# Patient Record
Sex: Female | Born: 1945 | Race: Black or African American | Hispanic: No | Smoking: Former smoker
Health system: Southern US, Community
[De-identification: ages and names within clinical notes are randomized; demographics above are authoritative.]

## PROBLEM LIST (undated history)

## (undated) DIAGNOSIS — C801 Malignant (primary) neoplasm, unspecified: Secondary | ICD-10-CM

## (undated) DIAGNOSIS — R918 Other nonspecific abnormal finding of lung field: Secondary | ICD-10-CM

## (undated) DIAGNOSIS — I10 Essential (primary) hypertension: Secondary | ICD-10-CM

## (undated) DIAGNOSIS — E1129 Type 2 diabetes mellitus with other diabetic kidney complication: Secondary | ICD-10-CM

---

## 2013-07-22 ENCOUNTER — Emergency Department: Payer: Self-pay | Admitting: Internal Medicine

## 2018-03-17 ENCOUNTER — Other Ambulatory Visit: Payer: Self-pay | Admitting: Internal Medicine

## 2018-03-17 DIAGNOSIS — R2242 Localized swelling, mass and lump, left lower limb: Secondary | ICD-10-CM

## 2018-03-27 ENCOUNTER — Ambulatory Visit: Admission: RE | Admit: 2018-03-27 | Payer: Medicaid Other | Source: Ambulatory Visit

## 2018-04-06 ENCOUNTER — Ambulatory Visit
Admission: RE | Admit: 2018-04-06 | Discharge: 2018-04-06 | Disposition: A | Discharge status: Home / Self Care | Payer: Medicare (Managed Care) | Source: Ambulatory Visit | Attending: Internal Medicine | Admitting: Internal Medicine

## 2018-04-06 DIAGNOSIS — R2242 Localized swelling, mass and lump, left lower limb: Secondary | ICD-10-CM

## 2018-04-06 DIAGNOSIS — M7122 Synovial cyst of popliteal space [Baker], left knee: Secondary | ICD-10-CM | POA: Diagnosis not present

## 2020-07-31 ENCOUNTER — Observation Stay
Admission: EM | Admit: 2020-07-31 | Discharge: 2020-08-01 | Disposition: A | Discharge status: Home / Self Care | Payer: Medicare (Managed Care) | Attending: Internal Medicine | Admitting: Internal Medicine

## 2020-07-31 ENCOUNTER — Other Ambulatory Visit: Payer: Self-pay

## 2020-07-31 ENCOUNTER — Encounter: Payer: Self-pay | Admitting: Emergency Medicine

## 2020-07-31 ENCOUNTER — Observation Stay: Payer: Medicare (Managed Care)

## 2020-07-31 ENCOUNTER — Emergency Department: Payer: Medicare (Managed Care)

## 2020-07-31 DIAGNOSIS — R9389 Abnormal findings on diagnostic imaging of other specified body structures: Secondary | ICD-10-CM | POA: Diagnosis not present

## 2020-07-31 DIAGNOSIS — R9089 Other abnormal findings on diagnostic imaging of central nervous system: Secondary | ICD-10-CM | POA: Diagnosis not present

## 2020-07-31 DIAGNOSIS — R7989 Other specified abnormal findings of blood chemistry: Secondary | ICD-10-CM | POA: Diagnosis present

## 2020-07-31 DIAGNOSIS — N390 Urinary tract infection, site not specified: Principal | ICD-10-CM | POA: Diagnosis present

## 2020-07-31 DIAGNOSIS — I10 Essential (primary) hypertension: Secondary | ICD-10-CM | POA: Diagnosis not present

## 2020-07-31 DIAGNOSIS — R918 Other nonspecific abnormal finding of lung field: Secondary | ICD-10-CM | POA: Diagnosis not present

## 2020-07-31 DIAGNOSIS — N3000 Acute cystitis without hematuria: Secondary | ICD-10-CM

## 2020-07-31 DIAGNOSIS — Z87891 Personal history of nicotine dependence: Secondary | ICD-10-CM | POA: Diagnosis not present

## 2020-07-31 DIAGNOSIS — R778 Other specified abnormalities of plasma proteins: Secondary | ICD-10-CM | POA: Diagnosis not present

## 2020-07-31 DIAGNOSIS — R4182 Altered mental status, unspecified: Secondary | ICD-10-CM | POA: Diagnosis present

## 2020-07-31 DIAGNOSIS — I447 Left bundle-branch block, unspecified: Secondary | ICD-10-CM | POA: Diagnosis not present

## 2020-07-31 DIAGNOSIS — B9689 Other specified bacterial agents as the cause of diseases classified elsewhere: Secondary | ICD-10-CM | POA: Diagnosis not present

## 2020-07-31 DIAGNOSIS — R2689 Other abnormalities of gait and mobility: Secondary | ICD-10-CM | POA: Diagnosis not present

## 2020-07-31 DIAGNOSIS — G9389 Other specified disorders of brain: Secondary | ICD-10-CM

## 2020-07-31 DIAGNOSIS — E876 Hypokalemia: Secondary | ICD-10-CM | POA: Diagnosis present

## 2020-07-31 DIAGNOSIS — E785 Hyperlipidemia, unspecified: Secondary | ICD-10-CM | POA: Diagnosis present

## 2020-07-31 DIAGNOSIS — G9341 Metabolic encephalopathy: Secondary | ICD-10-CM | POA: Diagnosis present

## 2020-07-31 DIAGNOSIS — E1129 Type 2 diabetes mellitus with other diabetic kidney complication: Secondary | ICD-10-CM | POA: Diagnosis present

## 2020-07-31 DIAGNOSIS — F32A Depression, unspecified: Secondary | ICD-10-CM | POA: Diagnosis present

## 2020-07-31 DIAGNOSIS — Z20822 Contact with and (suspected) exposure to covid-19: Secondary | ICD-10-CM | POA: Insufficient documentation

## 2020-07-31 HISTORY — DX: Essential (primary) hypertension: I10

## 2020-07-31 HISTORY — DX: Other specified disorders of brain: G93.89

## 2020-07-31 HISTORY — DX: Type 2 diabetes mellitus with other diabetic kidney complication: E11.29

## 2020-07-31 LAB — URINALYSIS, COMPLETE (UACMP) WITH MICROSCOPIC
Bilirubin Urine: NEGATIVE
Glucose, UA: NEGATIVE mg/dL
Ketones, ur: NEGATIVE mg/dL
Nitrite: NEGATIVE
Protein, ur: NEGATIVE mg/dL
Specific Gravity, Urine: 1.011 (ref 1.005–1.030)
pH: 8 (ref 5.0–8.0)

## 2020-07-31 LAB — CBC
HCT: 41.8 % (ref 36.0–46.0)
Hemoglobin: 13.2 g/dL (ref 12.0–15.0)
MCH: 27.2 pg (ref 26.0–34.0)
MCHC: 31.6 g/dL (ref 30.0–36.0)
MCV: 86.2 fL (ref 80.0–100.0)
Platelets: 357 10*3/uL (ref 150–400)
RBC: 4.85 MIL/uL (ref 3.87–5.11)
RDW: 15.1 % (ref 11.5–15.5)
WBC: 9 10*3/uL (ref 4.0–10.5)
nRBC: 0 % (ref 0.0–0.2)

## 2020-07-31 LAB — HEPATIC FUNCTION PANEL
ALT: 14 U/L (ref 0–44)
AST: 23 U/L (ref 15–41)
Albumin: 3.7 g/dL (ref 3.5–5.0)
Alkaline Phosphatase: 76 U/L (ref 38–126)
Bilirubin, Direct: 0.1 mg/dL (ref 0.0–0.2)
Indirect Bilirubin: 0.8 mg/dL (ref 0.3–0.9)
Total Bilirubin: 0.9 mg/dL (ref 0.3–1.2)
Total Protein: 7.4 g/dL (ref 6.5–8.1)

## 2020-07-31 LAB — LACTIC ACID, PLASMA
Lactic Acid, Venous: 2.8 mmol/L (ref 0.5–1.9)
Lactic Acid, Venous: 2.1 mmol/L (ref 0.5–1.9)
Lactic Acid, Venous: 2.4 mmol/L (ref 0.5–1.9)
Lactic Acid, Venous: 2.7 mmol/L (ref 0.5–1.9)

## 2020-07-31 LAB — BASIC METABOLIC PANEL
Anion gap: 10 (ref 5–15)
BUN: 13 mg/dL (ref 8–23)
CO2: 27 mmol/L (ref 22–32)
Calcium: 9.3 mg/dL (ref 8.9–10.3)
Chloride: 99 mmol/L (ref 98–111)
Creatinine, Ser: 0.6 mg/dL (ref 0.44–1.00)
GFR, Estimated: >60 mL/min (ref >60)
Glucose, Bld: 175 mg/dL — ABNORMAL HIGH (ref 70–99)
Potassium: 3.1 mmol/L — ABNORMAL LOW (ref 3.5–5.1)
Sodium: 136 mmol/L (ref 135–145)

## 2020-07-31 LAB — PROCALCITONIN: Procalcitonin: 20.33 ng/mL

## 2020-07-31 LAB — TROPONIN I (HIGH SENSITIVITY)
Troponin I (High Sensitivity): 21 ng/L — ABNORMAL HIGH (ref <18)
Troponin I (High Sensitivity): 20 ng/L — ABNORMAL HIGH (ref <18)
Troponin I (High Sensitivity): 19 ng/L — ABNORMAL HIGH (ref <18)

## 2020-07-31 LAB — MAGNESIUM: Magnesium: 1.8 mg/dL (ref 1.7–2.4)

## 2020-07-31 LAB — SARS CORONAVIRUS 2 (TAT 6-24 HRS): SARS Coronavirus 2: NEGATIVE

## 2020-07-31 LAB — CBG MONITORING, ED: Glucose-Capillary: 157 mg/dL — ABNORMAL HIGH (ref 70–99)

## 2020-07-31 LAB — BRAIN NATRIURETIC PEPTIDE: B Natriuretic Peptide: 105 pg/mL — ABNORMAL HIGH (ref 0.0–100.0)

## 2020-07-31 MED ORDER — ATORVASTATIN CALCIUM 20 MG PO TABS
40.0000 mg | ORAL_TABLET | Freq: Every day | ORAL | Status: DC
  Administered 2020-07-31: 40 mg via ORAL
  Filled 2020-07-31: qty 2

## 2020-07-31 MED ORDER — SODIUM CHLORIDE 0.9 % IV SOLN
1.0000 g | INTRAVENOUS | Status: DC
  Administered 2020-07-31: 1 g via INTRAVENOUS
  Filled 2020-07-31 (×2): qty 10

## 2020-07-31 MED ORDER — DULOXETINE HCL 30 MG PO CPEP
60.0000 mg | ORAL_CAPSULE | Freq: Every day | ORAL | Status: DC
  Administered 2020-07-31 – 2020-08-01 (×2): 60 mg via ORAL
  Filled 2020-07-31: qty 2
  Filled 2020-07-31: qty 1

## 2020-07-31 MED ORDER — IOHEXOL 300 MG/ML  SOLN
75.0000 mL | Freq: Once | INTRAMUSCULAR | Status: AC | PRN
  Administered 2020-07-31: 75 mL via INTRAVENOUS

## 2020-07-31 MED ORDER — INSULIN ASPART 100 UNIT/ML ~~LOC~~ SOLN
0.0000 [IU] | Freq: Three times a day (TID) | SUBCUTANEOUS | Status: DC
  Administered 2020-08-01 (×2): 2 [IU] via SUBCUTANEOUS
  Filled 2020-07-31 (×2): qty 1

## 2020-07-31 MED ORDER — LACTATED RINGERS IV BOLUS
1000.0000 mL | Freq: Once | INTRAVENOUS | Status: AC
  Administered 2020-07-31: 1000 mL via INTRAVENOUS

## 2020-07-31 MED ORDER — ACETAMINOPHEN 650 MG RE SUPP: 650.0000 mg | Freq: Four times a day (QID) | RECTAL | Status: DC | PRN

## 2020-07-31 MED ORDER — ONDANSETRON HCL 4 MG/2ML IJ SOLN: 4.0000 mg | Freq: Three times a day (TID) | INTRAMUSCULAR | Status: DC | PRN

## 2020-07-31 MED ORDER — ASPIRIN EC 81 MG PO TBEC: 81.0000 mg | DELAYED_RELEASE_TABLET | Freq: Every day | ORAL | Status: DC

## 2020-07-31 MED ORDER — ACETAMINOPHEN 325 MG PO TABS: 650.0000 mg | ORAL_TABLET | Freq: Four times a day (QID) | ORAL | Status: DC | PRN

## 2020-07-31 MED ORDER — TRAMADOL HCL 50 MG PO TABS
50.0000 mg | ORAL_TABLET | Freq: Four times a day (QID) | ORAL | Status: DC | PRN
  Administered 2020-08-01: 50 mg via ORAL
  Filled 2020-07-31: qty 1

## 2020-07-31 MED ORDER — HYDRALAZINE HCL 20 MG/ML IJ SOLN: 5.0000 mg | INTRAMUSCULAR | Status: DC | PRN

## 2020-07-31 MED ORDER — ENOXAPARIN SODIUM 40 MG/0.4ML ~~LOC~~ SOLN
40.0000 mg | SUBCUTANEOUS | Status: DC
  Administered 2020-07-31: 40 mg via SUBCUTANEOUS
  Filled 2020-07-31: qty 0.4

## 2020-07-31 MED ORDER — INSULIN ASPART 100 UNIT/ML ~~LOC~~ SOLN: 0.0000 [IU] | Freq: Every day | SUBCUTANEOUS | Status: DC

## 2020-07-31 MED ORDER — NIFEDIPINE ER OSMOTIC RELEASE 30 MG PO TB24
90.0000 mg | ORAL_TABLET | Freq: Every day | ORAL | Status: DC
  Administered 2020-07-31 – 2020-08-01 (×2): 90 mg via ORAL
  Filled 2020-07-31 (×2): qty 3

## 2020-07-31 MED ORDER — POTASSIUM CHLORIDE CRYS ER 20 MEQ PO TBCR
40.0000 meq | EXTENDED_RELEASE_TABLET | Freq: Once | ORAL | Status: AC
  Administered 2020-07-31: 40 meq via ORAL
  Filled 2020-07-31: qty 2

## 2020-07-31 MED ORDER — SODIUM CHLORIDE 0.9 % IV SOLN
1.0000 g | Freq: Once | INTRAVENOUS | Status: DC
  Filled 2020-07-31: qty 10

## 2020-07-31 MED ORDER — LACTATED RINGERS IV SOLN: INTRAVENOUS | Status: DC

## 2020-07-31 MED ORDER — LISINOPRIL 20 MG PO TABS
20.0000 mg | ORAL_TABLET | Freq: Every day | ORAL | Status: DC
  Administered 2020-07-31 – 2020-08-01 (×2): 20 mg via ORAL
  Filled 2020-07-31: qty 2
  Filled 2020-07-31: qty 1

## 2020-07-31 MED ORDER — ATORVASTATIN CALCIUM 20 MG PO TABS: 40.0000 mg | ORAL_TABLET | Freq: Every day | ORAL | Status: DC

## 2020-07-31 MED ORDER — METOPROLOL SUCCINATE ER 50 MG PO TB24
50.0000 mg | ORAL_TABLET | Freq: Every day | ORAL | Status: DC
  Administered 2020-07-31 – 2020-08-01 (×2): 50 mg via ORAL
  Filled 2020-07-31 (×2): qty 1

## 2020-07-31 NOTE — ED Notes (Signed)
Medication Reconciliation Report  For Home History Technicians  HIGHLIGHTS:  1. The patient WAS NOT personally interviewed 2. If not, what was the main source used: AVAILABLE CHART NOTES/DOCS 3. Does the patient appear to take any anti-coagulation agents (e.g. warfarin, Eliquis or Xarelto): NO 4. Does the patient appear to take any anti-convulsant agents (e.g. divalproex, levetiracetam or phenytoin): NO 5. Does the patient appear to use any insulin products (e.g. Lantus, Novolin or Humalog): NO 6. Does the patient appear to take any "beta-blockers" (e.g. metoprolol, carvedilol or bisoprolol: YES  BARRIERS:  1. Were there any barriers that prevented or complicated the medication reconciliation process: YES 2. If yes, what was the primary barrier encountered: Poor historian 3. Does the patient appear compliant with prescribed medications: UNABLE TO DETERMINE 4. Does the patient express any barriers with compliance: UNABLE TO DETERMINE 5. What is the primary barrier the patient reports: None   NOTES:[Include any concerns, remarks or complaints the patient expresses regarding medication therapy. Any observations or other information that might be useful to the treatment team can also be included. Immediate needs or concerns should be referred to the RN or appropriate member of the treatment team.]  The patient was not personally interviewed. Home medications updated from medication lists provided by [facility]. Unable to ascertain last doses taken.   Colen Darling, CPhT [place] [facility]. [place], [place] [address] [phone]  ** The above is intended solely for informational and/or communicative purposes. It should in no way be considered an endorsement of any specific treatment, therapy or action. **

## 2020-07-31 NOTE — ED Notes (Signed)
Unable to obtain labs. Lab called at this time.

## 2020-07-31 NOTE — ED Notes (Signed)
Pt given meal tray at this time, offered assistance eating but pt states she is able to eat independently. Pt instructed to use call bell if she needs help. Pt also reconnected to cardiac monitor/bp/spo2 at this time

## 2020-07-31 NOTE — ED Notes (Signed)
Pt seen eating meal tray at this time independently

## 2020-07-31 NOTE — ED Triage Notes (Signed)
Pt comes into the ED via EMS from home with c/o increased weakness and confusion. [facility] physician called and states the pt is being treated for UTI and the culture came back with E. Coli.Marland Kitchen

## 2020-07-31 NOTE — ED Provider Notes (Signed)
[facility] Emergency Department Provider Note   ____________________________________________   Event Date/Time   First MD Initiated Contact with Patient 07/31/20 1058     (approximate)  I have reviewed the triage vital signs and the nursing notes.   HISTORY  Chief Complaint Weakness    HPI Bradyn Vassey is a 75 y.o. female presents to the ED for altered mental status.  History is limited due to patient's confusion.  Per EMS patient has been increasingly weak and confused for about the past 7 days.  Patient states she has been feeling much weaker than usual but weakness does not affect any particular part of her body.  She was unable to get up out of bed this morning, after which EMS was called.  She denies any fevers, cough, chest pain, shortness of breath, vomiting, diarrhea, or dysuria.  Patient reportedly recently diagnosed with UTI and culture had grown E. coli.        History reviewed. No pertinent past medical history.  There are no problems to display for this patient.   History reviewed. No pertinent surgical history.  Prior to Admission medications   Not on File    Allergies Patient has no allergy information on record.  No family history on file.  Social History    Review of Systems  Constitutional: No fever/chills.  Positive for generalized weakness. Eyes: No visual changes. ENT: No sore throat. Cardiovascular: Denies chest pain. Respiratory: Denies shortness of breath. Gastrointestinal: No abdominal pain.  No nausea, no vomiting.  No diarrhea.  No constipation. Genitourinary: Negative for dysuria. Musculoskeletal: Negative for back pain. Skin: Negative for rash. Neurological: Negative for headaches, focal weakness or numbness.  ____________________________________________   PHYSICAL EXAM:  VITAL SIGNS: ED Triage Vitals  Enc Vitals Group     BP 07/31/20 1053 133/85     Pulse Rate 07/31/20 1053 78     Resp 07/31/20  1053 15     Temp 07/31/20 1053 97.9 F (36.6 C)     Temp Source 07/31/20 1053 Oral     SpO2 07/31/20 1053 97 %     Weight 07/31/20 1048 175 lb (79.4 kg)     Height 07/31/20 1048 5\' 5"  (1.651 m)     Head Circumference --      Peak Flow --      Pain Score --      Pain Loc --      Pain Edu? --      Excl. in [place]? --     Constitutional: Alert and oriented to person and place, but not time. Eyes: Conjunctivae are normal.  Pupils equal round and reactive to light bilaterally. Head: Atraumatic. Nose: No congestion/rhinnorhea. Mouth/Throat: Mucous membranes are moist. Neck: Normal ROM Cardiovascular: Normal rate, regular rhythm. Grossly normal heart sounds. Respiratory: Normal respiratory effort.  No retractions. Lungs CTAB. Gastrointestinal: Soft and nontender. No distention. Genitourinary: deferred Musculoskeletal: No lower extremity tenderness nor edema. Neurologic:  Normal speech and language.  Global weakness noted, no gross focal neurologic deficits are appreciated. Skin:  Skin is warm, dry and intact. No rash noted. Psychiatric: Mood and affect are normal. Speech and behavior are normal.  ____________________________________________   LABS (all labs ordered are listed, but only abnormal results are displayed)  Labs Reviewed  BASIC METABOLIC PANEL - Abnormal; Notable for the following components:      Result Value   Potassium 3.1 (*)    Glucose, Bld 175 (*)    All other components within  normal limits  URINALYSIS, COMPLETE (UACMP) WITH MICROSCOPIC - Abnormal; Notable for the following components:   Color, Urine YELLOW (*)    APPearance CLOUDY (*)    Hgb urine dipstick SMALL (*)    Leukocytes,Ua TRACE (*)    Bacteria, UA FEW (*)    All other components within normal limits  LACTIC ACID, PLASMA - Abnormal; Notable for the following components:   Lactic Acid, Venous 2.8 (*)    All other components within normal limits  LACTIC ACID, PLASMA - Abnormal; Notable for the  following components:   Lactic Acid, Venous 2.1 (*)    All other components within normal limits  CULTURE, BLOOD (ROUTINE X 2)  CULTURE, BLOOD (ROUTINE X 2)  URINE CULTURE  SARS CORONAVIRUS 2 (TAT 6-24 HRS)  CBC  HEPATIC FUNCTION PANEL   ____________________________________________  EKG  ED ECG REPORT I, Blake Divine, the attending physician, personally viewed and interpreted this ECG.   Date: 07/31/2020  EKG Time: 11:02  Rate: 73  Rhythm: normal sinus rhythm  Axis: LAD  Intervals:left bundle branch block  ST&T Change: None, negative sgarbossa   PROCEDURES  Procedure(s) performed (including Critical Care):  Procedures   ____________________________________________   INITIAL IMPRESSION / ASSESSMENT AND PLAN / ED COURSE       75 year old female presents to the ED for generalized weakness for the past 7 days and difficulty walking today.  She appears globally weak but has no focal neurologic deficits.  She reportedly was diagnosed with UTI recently and culture grew E. coli.  I attempted to reach family for more information, but was unsuccessful, it is unclear whether patient has already been put on antibiotics.  We will check labs, including blood cultures, but vital signs are reassuring and not consistent with sepsis.  Lactate mildly elevated, we will continue hydration with IV fluids and trend.  Remainder of labs are reassuring, UA does have signs of infection which we will treat with Rocephin and sent for culture.  Prior culture reportedly grew E. coli, although sensitivities are unavailable.  Given patient's altered mental status, case discussed with hospitalist for admission.      ____________________________________________   FINAL CLINICAL IMPRESSION(S) / ED DIAGNOSES  Final diagnoses:  Altered mental status, unspecified altered mental status type  Acute cystitis without hematuria     ED Discharge Orders    None       Note:  This document was  prepared using Dragon voice recognition software and may include unintentional dictation errors.   Blake Divine, MD 07/31/20 1346

## 2020-07-31 NOTE — Consult Note (Signed)
CODE SEPSIS - PHARMACY COMMUNICATION  **Broad Spectrum Antibiotics should be administered within 1 hour of Sepsis diagnosis**  Time Code Sepsis Called/Page Received: 1/3 1354  Antibiotics Ordered: ceftriaxone   Time of 1st antibiotic administration: 1351  Additional action taken by pharmacy: n/a  If necessary, Name of Provider/Nurse Contacted: [place] ,PharmD, BCPS Clinical Pharmacist  07/31/2020  2:20 PM

## 2020-07-31 NOTE — ED Notes (Addendum)
Report given to Ashley, RN

## 2020-07-31 NOTE — ED Notes (Signed)
Cheyenne Anthony, [phone] Daughter

## 2020-07-31 NOTE — ED Notes (Signed)
purwick in place

## 2020-07-31 NOTE — ED Notes (Signed)
Date and time results received: 07/31/20 1139   Test: Lactic Acid Critical Value: 1139  Name of Provider Notified: Charna Archer, MD  Orders Received? Or Actions Taken?: ED MD aware

## 2020-07-31 NOTE — H&P (Signed)
History and Physical    Cristen Bredeson BHA:[id number] DOB: [birth year] DOA: 07/31/2020  Referring MD/NP/PA:   PCP: [facility]   Patient coming from:  The patient is coming from home.  At baseline, pt is partially dependent for most of ADL.        Chief Complaint: AMS and increased urinary frequency  HPI: Cheyenne Anthony is a 75 y.o. female with medical history significant of hypertension, diabetes mellitus, former smoker, depression, hyperlipidemia, arthritis of knees, who presents with altered mental status and increased urinary frequency.  Per her daughter, patient has been confused for more than 1 week.  She has generalized weakness, which has been progressively worsening.  No unilateral numbness or tingling in extremities.  No facial droop or slurred speech.  Patient has shaking, possible chills, but no fever.  No nausea, vomiting, diarrhea, abdominal pain.  No chest pain, cough, shortness of breath.  Patient has increased urinary frequency, no dysuria or burning on urination.  Per her daughter, patient had urine culture done last Wednesday in [facility], which reportedly grew E. coli, not sure about the sensitivity.  Daughter states that no antibiotics were started yet.  ED Course: pt was found to have WBC 9.0, lactic acid 2.8, 2.1, urinalysis (cloudy appearance, trace amount of leukocyte, few bacteria, WBC 6-10), pending Covid PCR, potassium 3.1, renal function okay, temperature normal, blood pressure 155/85, heart rate 80, RR 23, oxygen saturation 91-96% on room air.  Chest x-ray showed 5 cm of round opacity in the right middle lobe.  Patient is placed on MedSurg bed for observation  Review of Systems: Could not reviewed due to altered mental status.   Allergy: Not on File  Per her daughter, patient does not have history of allergy.  Past Medical History:  Diagnosis Date  . Hypertension   . Type II diabetes mellitus with renal manifestations ([place])      History reviewed. No pertinent surgical history.  Social History:  reports that she has quit smoking. She has never used smokeless tobacco. She reports that she does not drink alcohol and does not use drugs.  Family History:  Family History  Problem Relation Age of Onset  . Stroke Brother      Prior to Admission medications   Not on File    Physical Exam: Vitals:   07/31/20 1700 07/31/20 1730 07/31/20 1800 07/31/20 1830  BP: (!) 158/98 (!) 150/94 120/72 (!) 140/95  Pulse:    85  Resp:    18  Temp:      TempSrc:      SpO2:    98%  Weight:      Height:       General: Not in acute distress HEENT:       Eyes: PERRL, EOMI, no scleral icterus.       ENT: No discharge from the ears and nose       Neck: No JVD, no bruit, no mass felt. Heme: No neck lymph node enlargement. Cardiac: S1/S2, RRR, No murmurs, No gallops or rubs. Respiratory: No rales, wheezing, rhonchi or rubs. GI: Soft, nondistended, nontender, no organomegaly, BS present. GU: No hematuria Ext: No pitting leg edema bilaterally. 1+DP/PT pulse bilaterally. Musculoskeletal: No joint deformities, No joint redness or warmth, no limitation of ROM in spin. Skin: No rashes.  Neuro: confused, knows her own name, not orientated to place and time, cranial nerves II-XII grossly intact, moves all extremities  Psych: Patient is not psychotic,  no suicidal or hemocidal ideation.  Labs on Admission: I have personally reviewed following labs and imaging studies  CBC: Recent Labs  Lab 07/31/20 1111  WBC 9.0  HGB 13.2  HCT 41.8  MCV 86.2  PLT 664   Basic Metabolic Panel: Recent Labs  Lab 07/31/20 1111  NA 136  K 3.1*  CL 99  CO2 27  GLUCOSE 175*  BUN 13  CREATININE 0.60  CALCIUM 9.3  MG 1.8   GFR: Estimated Creatinine Clearance: 65.3 mL/min (by C-G formula based on SCr of 0.6 mg/dL). Liver Function Tests: Recent Labs  Lab 07/31/20 1111  AST 23  ALT 14  ALKPHOS 76  BILITOT 0.9  PROT 7.4  ALBUMIN  3.7   No results for input(s): LIPASE, AMYLASE in the last 168 hours. No results for input(s): AMMONIA in the last 168 hours. Coagulation Profile: No results for input(s): INR, PROTIME in the last 168 hours. Cardiac Enzymes: No results for input(s): CKTOTAL, CKMB, CKMBINDEX, TROPONINI in the last 168 hours. BNP (last 3 results) No results for input(s): PROBNP in the last 8760 hours. HbA1C: No results for input(s): HGBA1C in the last 72 hours. CBG: No results for input(s): GLUCAP in the last 168 hours. Lipid Profile: No results for input(s): CHOL, HDL, LDLCALC, TRIG, CHOLHDL, LDLDIRECT in the last 72 hours. Thyroid Function Tests: No results for input(s): TSH, T4TOTAL, FREET4, T3FREE, THYROIDAB in the last 72 hours. Anemia Panel: No results for input(s): VITAMINB12, FOLATE, FERRITIN, TIBC, IRON, RETICCTPCT in the last 72 hours. Urine analysis:    Component Value Date/Time   COLORURINE YELLOW (A) 07/31/2020 1259   APPEARANCEUR CLOUDY (A) 07/31/2020 1259   LABSPEC 1.011 07/31/2020 1259   PHURINE 8.0 07/31/2020 1259   GLUCOSEU NEGATIVE 07/31/2020 1259   HGBUR SMALL (A) 07/31/2020 1259   BILIRUBINUR NEGATIVE 07/31/2020 1259   KETONESUR NEGATIVE 07/31/2020 1259   PROTEINUR NEGATIVE 07/31/2020 1259   NITRITE NEGATIVE 07/31/2020 1259   LEUKOCYTESUR TRACE (A) 07/31/2020 1259   Sepsis Labs: @LABRCNTIP (procalcitonin:4,lacticidven:4) )No results found for this or any previous visit (from the past 240 hour(s)).   Radiological Exams on Admission: DG Chest 2 View  Result Date: 07/31/2020 CLINICAL DATA:  Pt comes into the ED via EMS from home with c/o increased weakness and confusion. [facility] physician called and states the pt is being treated for UTI and the culture came back with E. Coli. EXAM: CHEST - 2 VIEW COMPARISON:  None. FINDINGS: Apparent round mass projects within the right middle lobe, approximately 5 cm in size. There is interstitial thickening that is most evident in the lung  bases bilaterally. No other masses or areas of confluent lung opacity. No pleural effusion or pneumothorax. Cardiac silhouette is borderline enlarged. No mediastinal or hilar masses. Skeletal structures are demineralized but grossly intact. IMPRESSION: 1. 5 cm rounded opacity in the right middle lobe. This may reflect a mass or rounded pneumonia. Recommend follow-up chest CT with contrast for further assessment. Electronically Signed   By: Lajean Manes M.D.   On: 07/31/2020 12:54     EKG: I have personally reviewed.  Sinus rhythm, QTc 450, LAD, poor R wave progression, left bundle blockade, anteroseptal infarction pattern  Assessment/Plan Principal Problem:   UTI (urinary tract infection) Active Problems:   Hypokalemia   Acute metabolic encephalopathy   Abnormal CXR with 5 cm round opacity in RML   LBBB (left bundle branch block)   Hypertension   Type II diabetes mellitus with renal manifestations ([facility])   Elevated  troponin   UTI (urinary tract infection): urine culture grew UTI reportedly.  Patient has elevated lactic acid of 2.8, 2.1, but clinically does not meet criteria for sepsis.  No fever, leukocytosis or tachycardia.  Currently hemodynamically stable  -  Place on med-surg bed for obs -  Ceftriaxone by IV - Follow up results of urine and blood cx and amend antibiotic regimen if needed per sensitivity results - prn Zofran for nausea - will get Procalcitonin and trend lactic acid levels  -  IVF: 2 L of LR of NS bolus in ED, followed by 75 cc/h   Hypokalemia: K= 3.1 on admission. - Repleted - Check Mg level  Acute metabolic encephalopathy: Likely due to UTI -Frequent neuro check - CT-head  Abnormal CXR with 5 cm round opacity in RML: Patient does not have fever or leukocytosis.  No respiratory symptoms, does not seem to have pneumonia. -will get CT of CT chest with contrast  LBBB (left bundle branch block): not chest pain. No old EKG for comparison. Trop 21 which is  minimally elevated, likely due to demand ischemia -Trend troponin -Aspirin 81 mg daily -Check A1c, FLP -Repeat EKG pneumonia -Start Lipitor 40 mg daily  Type II diabetes mellitus with renal manifestations: No A1c on record, CBG 175 today.  Patient is taking Janumet. -Sliding scale insulin  -will check A1c  HTN: -IV hydralazine as needed -Nifedipine, lisinopril, metoprolol  Depression: -Cymbalta     DVT ppx:   SQ Lovenox Code Status: Full code Family Communication:    Yes, patient's daughter by phone   Disposition Plan:  Anticipate discharge back to previous environment Consults called: None Admission status: Med-surg bed for obs   Status is: Observation  The patient remains OBS appropriate and will d/c before 2 midnights.  Dispo: The patient is from: Home              Anticipated d/c is to: Home              Anticipated d/c date is: 1 day              Patient currently is not medically stable to d/c.           Date of Service 07/31/2020    Ivor Costa [facility]   If 7PM-7AM, please contact night-coverage [email or web address] 07/31/2020, 6:46 PM

## 2020-07-31 NOTE — ED Notes (Signed)
Pt given warm blanket, daughter at bedside, updated on plan and what has been done by this RN.

## 2020-07-31 NOTE — ED Notes (Signed)
Lab at bedside

## 2020-08-01 ENCOUNTER — Encounter: Payer: Self-pay | Admitting: Internal Medicine

## 2020-08-01 ENCOUNTER — Observation Stay: Payer: Medicare (Managed Care)

## 2020-08-01 DIAGNOSIS — N3000 Acute cystitis without hematuria: Secondary | ICD-10-CM | POA: Diagnosis not present

## 2020-08-01 DIAGNOSIS — R918 Other nonspecific abnormal finding of lung field: Secondary | ICD-10-CM

## 2020-08-01 DIAGNOSIS — G9389 Other specified disorders of brain: Secondary | ICD-10-CM | POA: Diagnosis not present

## 2020-08-01 LAB — TROPONIN I (HIGH SENSITIVITY)
Troponin I (High Sensitivity): 18 ng/L — ABNORMAL HIGH (ref <18)
Troponin I (High Sensitivity): 17 ng/L (ref <18)

## 2020-08-01 LAB — CBC
HCT: 37.4 % (ref 36.0–46.0)
Hemoglobin: 12.5 g/dL (ref 12.0–15.0)
MCH: 28.4 pg (ref 26.0–34.0)
MCHC: 33.4 g/dL (ref 30.0–36.0)
MCV: 85 fL (ref 80.0–100.0)
Platelets: 332 10*3/uL (ref 150–400)
RBC: 4.4 MIL/uL (ref 3.87–5.11)
RDW: 15 % (ref 11.5–15.5)
WBC: 8.3 10*3/uL (ref 4.0–10.5)
nRBC: 0 % (ref 0.0–0.2)

## 2020-08-01 LAB — GLUCOSE, CAPILLARY
Glucose-Capillary: 171 mg/dL — ABNORMAL HIGH (ref 70–99)
Glucose-Capillary: 154 mg/dL — ABNORMAL HIGH (ref 70–99)
Glucose-Capillary: 183 mg/dL — ABNORMAL HIGH (ref 70–99)

## 2020-08-01 LAB — BASIC METABOLIC PANEL
Anion gap: 10 (ref 5–15)
BUN: 8 mg/dL (ref 8–23)
CO2: 25 mmol/L (ref 22–32)
Calcium: 9.1 mg/dL (ref 8.9–10.3)
Chloride: 101 mmol/L (ref 98–111)
Creatinine, Ser: 0.35 mg/dL — ABNORMAL LOW (ref 0.44–1.00)
GFR, Estimated: >60 mL/min (ref >60)
Glucose, Bld: 171 mg/dL — ABNORMAL HIGH (ref 70–99)
Potassium: 3.1 mmol/L — ABNORMAL LOW (ref 3.5–5.1)
Sodium: 136 mmol/L (ref 135–145)

## 2020-08-01 LAB — LIPID PANEL
Cholesterol: 112 mg/dL (ref 0–200)
HDL: 35 mg/dL — ABNORMAL LOW (ref >40)
LDL Cholesterol: 51 mg/dL (ref 0–99)
Total CHOL/HDL Ratio: 3.2 RATIO
Triglycerides: 128 mg/dL (ref <150)
VLDL: 26 mg/dL (ref 0–40)

## 2020-08-01 LAB — HEMOGLOBIN A1C
Hgb A1c MFr Bld: 8.4 % — ABNORMAL HIGH (ref 4.8–5.6)
Mean Plasma Glucose: 194.38 mg/dL

## 2020-08-01 MED ORDER — DEXAMETHASONE SODIUM PHOSPHATE 10 MG/ML IJ SOLN: 6.0000 mg | Freq: Four times a day (QID) | INTRAMUSCULAR | Status: DC

## 2020-08-01 MED ORDER — DEXAMETHASONE SODIUM PHOSPHATE 10 MG/ML IJ SOLN
10.0000 mg | Freq: Four times a day (QID) | INTRAMUSCULAR | Status: DC
  Administered 2020-08-01: 10 mg via INTRAVENOUS
  Filled 2020-08-01: qty 1

## 2020-08-01 MED ORDER — DEXAMETHASONE SODIUM PHOSPHATE 10 MG/ML IJ SOLN: 10.0000 mg | Freq: Four times a day (QID) | INTRAMUSCULAR | 0 refills | Status: DC

## 2020-08-01 MED ORDER — IOHEXOL 9 MG/ML PO SOLN
500.0000 mL | ORAL | Status: AC
  Administered 2020-08-01: 500 mL via ORAL

## 2020-08-01 MED ORDER — ATORVASTATIN CALCIUM 20 MG PO TABS
40.0000 mg | ORAL_TABLET | Freq: Every day | ORAL | Status: DC
Start: 2020-08-01 — End: 2020-08-01

## 2020-08-01 MED ORDER — POTASSIUM CHLORIDE CRYS ER 20 MEQ PO TBCR
60.0000 meq | EXTENDED_RELEASE_TABLET | Freq: Once | ORAL | Status: AC
  Administered 2020-08-01: 60 meq via ORAL
  Filled 2020-08-01: qty 3

## 2020-08-01 MED ORDER — SODIUM CHLORIDE 0.9 % IV SOLN: 1.0000 g | INTRAVENOUS | Status: DC

## 2020-08-01 NOTE — Discharge Summary (Signed)
Physician Discharge Summary  Cheyenne Anthony ZOX:[id number] DOB: Jun 03, 1946 DOA: 07/31/2020  PCP: [place] date: 07/31/2020 Discharge date: 08/01/2020  Admitted From: Home Disposition:  [facility] ED  Discharge Condition:Stable CODE STATUS:FULL Diet recommendation: Heart Healthy  Brief/Interim Summary:  Patient is a 75 year old female with history of hypertension, diabetes type 2, former smoker, depression, hyperlipidemia, arthritis who presents with altered mental status and increased urinary frequency from home.  She was confused for more than a week.  She was progressively getting weak.  As per the daughter, patient [facility] which showed E. coli, not sure about the sensitivity and no antibiotics started yet.  Urinalysis done here did not show any significant leukocytes.  Started on ceftriaxone.  Chest x-ray done on admission showed 5 cm rounded opacity in the right middle lobe.  CT chest was done subsequently which showed5-6 cm right middle lobe mass consistent with primary bronchogenic carcinoma. Lymphangitic spread of carcinoma within the right middle lobe,early right hilar lymphadenopathy.  CT head showed 4-5 cm mass lesion within the right cerebellum, with areas of internal necrosis,surrounding vasogenic edema,mass-effect upon the fourth ventricle with obstructive hydrocephalus of the lateral and third ventricles.  Oncology,neurosurgery  consulted  for malignancy work-up.  Neurosurgery recommended to transfer to [facility] for possible tumor resection.  She will be transferred today to the emergency department at [facility].  Following problems were addressed during hospitalization:  Right lung mass/brain mass:Imagings as above. 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  Presented with altered mental status.  Started  on Decadron because imaging showed mass-effect and surrounding worsening edema. s.   Oncology,neurosurgery  consulted  for malignancy work-up.  Neurosurgery recommended to transfer to [facility] for possible tumor resection.  She will be transferred today to the emergency department at [facility].  Suspected UTI: Recently diagnosed in outpatient setting.  UA not that impressive, will follow urine culture.  Continue ceftriaxone for now.  She does not have leukocytosis or fever.  Had some mild elevated lactic acid.  Acute metabolic encephalopathy: Most likely secondary to brain mass.  Continue monitor mental status  Hypokalemia: Supplemented with potassium  Left bundle branch block: No chest pain, no alcohol EKG for comparison.  Minimally elevated troponin.  On aspirin at home.  Recommend outpatient follow-up with cardiology.  Diabetes mellitus: Hemoglobin A1c of 8.4.   Hypertension: On nifedipine, lisinopril, metoprolol.  Continue monitor blood pressure  Depression: Continue Cymbalta  Hyperlipidemia: On Lipitor  Discharge Diagnoses:  Principal Problem:   UTI (urinary tract infection) Active Problems:   Hypokalemia   Acute metabolic encephalopathy   Abnormal CXR with 5 cm round opacity in RML   LBBB (left bundle branch block)   Hypertension   Type II diabetes mellitus with renal manifestations (HCC)   Elevated troponin   HLD (hyperlipidemia)   Depression    Discharge Instructions  Discharge Instructions    Diet - low sodium heart healthy   Complete by: As directed      Allergies as of 08/01/2020   Not on File     Medication List    STOP taking these medications   aspirin EC 81 MG tablet   naproxen 500 MG tablet Commonly known as: NAPROSYN     TAKE these medications   atorvastatin 40 MG tablet Commonly known as: LIPITOR Take 40 mg by mouth daily with supper.   capsaicin 0.025 % cream Commonly known as: ZOSTRIX Apply 1  application topically 2 (two) times daily as needed (joint pain). (apply to right shoulder and knees)    cefTRIAXone 1 g in sodium chloride 0.9 % 100 mL Inject 1 g into the vein daily.   dexamethasone 10 MG/ML injection Commonly known as: DECADRON Inject 1 mL (10 mg total) into the vein every 6 (six) hours.   DULoxetine 60 MG capsule Commonly known as: CYMBALTA Take 60 mg by mouth daily.   eucerin cream Apply 1 application topically daily as needed for dry skin.   hydrocortisone cream 1 % Apply 1 application topically 3 (three) times daily as needed for itching.   latanoprost 0.005 % ophthalmic solution Commonly known as: XALATAN Place 1 drop into both eyes at bedtime.   lidocaine 5 % Commonly known as: LIDODERM Place 1 patch onto the skin daily. Remove & Discard patch within 12 hours or as directed by MD   lisinopril-hydrochlorothiazide 20-25 MG tablet Commonly known as: ZESTORETIC Take 1 tablet by mouth daily.   metoprolol succinate 50 MG 24 hr tablet Commonly known as: TOPROL-XL Take 50 mg by mouth daily. Take with or immediately following a meal.   NIFEdipine 90 MG 24 hr tablet Commonly known as: ADALAT CC Take 90 mg by mouth daily.   sitaGLIPtin-metformin 50-1000 MG tablet Commonly known as: JANUMET Take 1 tablet by mouth 2 (two) times daily.   traMADol 50 MG tablet Commonly known as: ULTRAM Take 100 mg by mouth daily.       Not on File  Consultations:  Oncology, neurosurgery   Procedures/Studies: DG Chest 2 View  Result Date: 07/31/2020 CLINICAL DATA:  Pt comes into the ED via EMS from home with c/o increased weakness and confusion. [facility] physician called and states the pt is being treated for UTI and the culture came back with E. Coli. EXAM: CHEST - 2 VIEW COMPARISON:  None. FINDINGS: Apparent round mass projects within the right middle lobe, approximately 5 cm in size. There is interstitial thickening that is most evident in the lung bases bilaterally. No other masses or areas of confluent lung opacity. No pleural effusion or pneumothorax. Cardiac  silhouette is borderline enlarged. No mediastinal or hilar masses. Skeletal structures are demineralized but grossly intact. IMPRESSION: 1. 5 cm rounded opacity in the right middle lobe. This may reflect a mass or rounded pneumonia. Recommend follow-up chest CT with contrast for further assessment. Electronically Signed   By: Lajean Manes M.D.   On: 07/31/2020 12:54   CT HEAD WO CONTRAST  Result Date: 07/31/2020 CLINICAL DATA:  Mental status changes of unknown cause. Weakness and confusion worsening over the last week. EXAM: CT HEAD WITHOUT CONTRAST TECHNIQUE: Contiguous axial images were obtained from the base of the skull through the vertex without intravenous contrast. COMPARISON:  None. 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 There is a mass lesion within the right cerebellum measuring 4-5 cm in diameter, with areas of internal necrosis. Regional vasogenic edema. Mass-effect upon the fourth ventricle with obstructive hydrocephalus of the lateral and third ventricles. Most likely diagnosis is metastatic disease. Primary tumor not excluded but much less common. Vascular: There is atherosclerotic calcification of the major vessels at the base of the brain. Skull: Normal Sinuses/Orbits: Paranasal sinus opacification on the left likely due to ostiomeatal unit disease. Right-sided sinuses are clear. Orbits negative. Other: None IMPRESSION: 1. 4-5 cm mass lesion within the  right cerebellum, with areas of internal necrosis. Surrounding vasogenic edema. Mass-effect upon the fourth ventricle with obstructive hydrocephalus of the lateral and third ventricles. Most likely diagnosis is metastatic disease. Primary tumor not excluded but much less common. 2. 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 3. Paranasal sinus opacification on the left likely due to ostiomeatal unit disease. Electronically Signed   By: Nelson Chimes M.D.   On: 07/31/2020 19:15   CT CHEST W CONTRAST  Result Date: 07/31/2020 CLINICAL DATA:  Mental status changes. Abnormal chest radiography with density on the right. EXAM: CT CHEST WITH CONTRAST TECHNIQUE: Multidetector CT imaging of the chest was performed during intravenous contrast administration. CONTRAST:  52mL OMNIPAQUE IOHEXOL 300 MG/ML  SOLN COMPARISON:  Chest radiography same day FINDINGS: Cardiovascular: Mild cardiomegaly. Coronary artery calcification. Aortic atherosclerosis. No pericardial effusion. Mediastinum/Nodes: No mediastinal lymphadenopathy. Early right hilar lymphadenopathy, the largest node measuring 15 mm. Lungs/Pleura: Right middle lobe mass measuring 5-6 cm in diameter. Lymphangitic spread of carcinoma within the right middle lobe. Broad surface along the pleural surface, but without pleural effusion. No other pulmonary parenchymal lesion. No sign of underlying emphysema. Upper Abdomen: No abnormality seen. Right adrenal gland not included in the region studied. Musculoskeletal: Negative IMPRESSION: 1. 5-6 cm right middle lobe mass consistent with primary bronchogenic carcinoma. Lymphangitic spread of carcinoma within the right middle lobe. Early right hilar lymphadenopathy, the largest node measuring 15 mm. 2. Cardiomegaly. Coronary artery calcification. 3. Aortic atherosclerosis. Aortic Atherosclerosis (ICD10-I70.0). Electronically Signed   By: Nelson Chimes M.D.   On: 07/31/2020 19:19       Subjective: Patient seen and examined the bedside this morning.  Hemodynamically stable for transfer today  Discharge Exam: Vitals:   08/01/20 0914 08/01/20 1144  BP: 128/76 (!) 149/85  Pulse: 72 69  Resp:  16  Temp: 98.5 F (36.9 C) 98.7 F (37.1 C)  SpO2: 98% 98%   Vitals:    08/01/20 0213 08/01/20 0549 08/01/20 0914 08/01/20 1144  BP: (!) 147/80 127/76 128/76 (!) 149/85  Pulse: 80 74 72 69  Resp: 18 16  16   Temp:  98.2 F (36.8 C) 98.5 F (36.9 C) 98.7 F (37.1 C)  TempSrc:  Oral Oral Oral  SpO2: 92% 96% 98% 98%  Weight:      Height:        General: Pt is alert, awake, not in acute distress, confused Cardiovascular: RRR, S1/S2 +, no rubs, no gallops Respiratory: CTA bilaterally, no wheezing, no rhonchi Abdominal: Soft, NT, ND, bowel sounds + Extremities: no edema, no cyanosis    The results of significant diagnostics from this hospitalization (including imaging, microbiology, ancillary and laboratory) are listed below for reference.     Microbiology: Recent Results (from the past 240 hour(s))  Culture, blood (routine x 2)     Status: None (Preliminary result)   Collection Time: 07/31/20 11:50 AM   Specimen: BLOOD  Result Value Ref Range Status   Specimen Description BLOOD RIGHT FA  Final   Special Requests   Final    BOTTLES DRAWN AEROBIC AND ANAEROBIC Blood Culture results may not be optimal due to an inadequate volume of blood received in culture bottles   Culture   Final    NO GROWTH < 24 HOURS Performed at [facility], [address] [place], [place] [address]    Report Status PENDING  Incomplete  Culture, blood (routine x 2)     Status: None (Preliminary result)   Collection Time: 07/31/20 11:50 AM   Specimen: BLOOD  Result Value Ref Range Status   Specimen Description BLOOD RIGHT HAND  Final   Special Requests   Final    BOTTLES DRAWN AEROBIC AND ANAEROBIC Blood Culture results may not be optimal due to an inadequate volume of blood received in culture bottles   Culture   Final    NO GROWTH < 24 HOURS Performed at [facility], [address], [place], [place] [address]    Report Status PENDING  Incomplete  Urine culture     Status: Abnormal (Preliminary result)   Collection Time: 07/31/20 12:59 PM    Specimen: Urine, Random  Result Value Ref Range Status   Specimen Description   Final    URINE, RANDOM Performed at [facility], [address] [place], [place] [address]    Special Requests   Final    NONE Performed at [facility], [address], [place], [place] [address]    Culture >=100,000 COLONIES/mL GRAM NEGATIVE RODS (A)  Final   Report Status PENDING  Incomplete  SARS CORONAVIRUS 2 (TAT 6-24 HRS) Nasopharyngeal     Status: None   Collection Time: 07/31/20 12:59 PM   Specimen: Nasopharyngeal  Result Value Ref Range Status   SARS Coronavirus 2 NEGATIVE NEGATIVE Final    Comment: (NOTE) SARS-CoV-2 target nucleic acids are NOT DETECTED.  The SARS-CoV-2 RNA is generally detectable in upper and lower respiratory specimens during the acute phase of infection. Negative results do not preclude SARS-CoV-2 infection, do not rule out co-infections with other pathogens, and should not be used as the sole basis for treatment or other patient management decisions. Negative results must be combined with clinical observations, patient history, and epidemiological information. The expected result is Negative.  Fact Sheet for Patients: SugarRoll.be  Fact Sheet for Healthcare Providers: https://www.woods-mathews.com/  This test is not yet approved or cleared by the [place] FDA and  has been authorized for detection and/or diagnosis of SARS-CoV-2 by FDA under an Emergency Use Authorization (EUA). This EUA will remain  in effect (meaning this test can be used) for the duration of the COVID-19 declaration under Se ction 564(b)(1) of the Act, 21 U.S.C. section 360bbb-3(b)(1), unless the authorization is terminated or revoked sooner.  Performed at [facility], [place] [address], [place], [place] [address]      Labs: BNP (last 3 results) Recent Labs    07/31/20 1110  BNP 106.2*   Basic Metabolic  Panel: Recent Labs  Lab 07/31/20 1111 08/01/20 0705  NA 136 136  K 3.1* 3.1*  CL 99 101  CO2 27 25  GLUCOSE 175* 171*  BUN 13 8  CREATININE 0.60 0.35*  CALCIUM 9.3 9.1  MG 1.8  --    Liver Function Tests: Recent Labs  Lab 07/31/20 1111  AST 23  ALT 14  ALKPHOS 76  BILITOT 0.9  PROT 7.4  ALBUMIN 3.7   No results for input(s): LIPASE, AMYLASE in the last 168 hours. No results for input(s): AMMONIA in the last 168 hours. CBC: Recent Labs  Lab 07/31/20 1111 08/01/20 0705  WBC 9.0 8.3  HGB 13.2 12.5  HCT 41.8 37.4  MCV 86.2 85.0  PLT 357 332   Cardiac Enzymes: No results for input(s): CKTOTAL, CKMB, CKMBINDEX, TROPONINI in the last 168 hours. BNP: Invalid input(s): POCBNP CBG: Recent Labs  Lab 07/31/20 2108 08/01/20 0804 08/01/20 1142  GLUCAP 157* 171* 154*   D-Dimer No results for input(s): DDIMER in the last 72 hours. Hgb A1c Recent Labs    08/01/20 0705  HGBA1C 8.4*   Lipid Profile Recent Labs    08/01/20 0705  CHOL 112  HDL 35*  LDLCALC 51  TRIG 128  CHOLHDL 3.2   Thyroid function studies No results for input(s): TSH, T4TOTAL, T3FREE, THYROIDAB in the last 72 hours.  Invalid input(s): FREET3 Anemia work up No results for input(s): VITAMINB12, FOLATE, FERRITIN, TIBC, IRON, RETICCTPCT in the last 72 hours. Urinalysis    Component Value Date/Time   COLORURINE YELLOW (A) 07/31/2020 1259   APPEARANCEUR CLOUDY (A) 07/31/2020 1259   LABSPEC 1.011 07/31/2020 1259   PHURINE 8.0 07/31/2020 1259   GLUCOSEU NEGATIVE 07/31/2020 1259   HGBUR SMALL (A) 07/31/2020 1259   BILIRUBINUR NEGATIVE 07/31/2020 1259   KETONESUR NEGATIVE 07/31/2020 1259   PROTEINUR NEGATIVE 07/31/2020 1259   NITRITE NEGATIVE 07/31/2020 1259   LEUKOCYTESUR TRACE (A) 07/31/2020 1259   Sepsis Labs Invalid input(s): PROCALCITONIN,  WBC,  LACTICIDVEN Microbiology Recent Results (from the past 240 hour(s))  Culture, blood (routine x 2)     Status: None (Preliminary result)    Collection Time: 07/31/20 11:50 AM   Specimen: BLOOD  Result Value Ref Range Status   Specimen Description BLOOD RIGHT FA  Final   Special Requests   Final    BOTTLES DRAWN AEROBIC AND ANAEROBIC Blood Culture results may not be optimal due to an inadequate volume of blood received in culture bottles   Culture   Final    NO GROWTH < 24 HOURS Performed at [facility], [address], [place], [place] [address]    Report Status PENDING  Incomplete  Culture, blood (routine x 2)     Status: None (Preliminary result)   Collection Time: 07/31/20 11:50 AM   Specimen: BLOOD  Result Value Ref Range Status   Specimen Description BLOOD RIGHT HAND  Final   Special Requests   Final    BOTTLES DRAWN AEROBIC AND ANAEROBIC Blood Culture results may not be optimal due to an inadequate volume of blood received in culture bottles   Culture   Final    NO GROWTH < 24 HOURS Performed at [facility], [address], [place], [place] [address]    Report Status PENDING  Incomplete  Urine culture     Status: Abnormal (Preliminary result)   Collection Time: 07/31/20 12:59 PM   Specimen: Urine, Random  Result Value Ref Range Status   Specimen Description   Final    URINE, RANDOM Performed at [facility], [address] [place], [place] [address]    Special Requests   Final    NONE Performed at [facility], [address], [place], [place] [address]    Culture >=100,000 COLONIES/mL GRAM NEGATIVE RODS (A)  Final   Report Status PENDING  Incomplete  SARS CORONAVIRUS 2 (TAT 6-24 HRS) Nasopharyngeal     Status: None   Collection Time: 07/31/20 12:59 PM   Specimen: Nasopharyngeal  Result Value Ref Range Status   SARS Coronavirus 2 NEGATIVE NEGATIVE Final    Comment: (NOTE) SARS-CoV-2 target nucleic acids are NOT DETECTED.  The SARS-CoV-2 RNA is generally detectable in upper and lower respiratory specimens during the acute phase of infection.  Negative results do not preclude SARS-CoV-2 infection, do not rule out co-infections with other pathogens, and should not be  used as the sole basis for treatment or other patient management decisions. Negative results must be combined with clinical observations, patient history, and epidemiological information. The expected result is Negative.  Fact Sheet for Patients: SugarRoll.be  Fact Sheet for Healthcare Providers: https://www.woods-mathews.com/  This test is not yet approved or cleared by the [place] FDA and  has been authorized for detection and/or diagnosis of SARS-CoV-2 by FDA under an Emergency Use Authorization (EUA). This EUA will remain  in effect (meaning this test can be used) for the duration of the COVID-19 declaration under Se ction 564(b)(1) of the Act, 21 U.S.C. section 360bbb-3(b)(1), unless the authorization is terminated or revoked sooner.  Performed at [facility], [place] [address], [place], [place] [address]     Please note: You were cared for by a hospitalist during your hospital stay. Once you are discharged, your primary care physician will handle any further medical issues. Please note that NO REFILLS for any discharge medications will be authorized once you are discharged, as it is imperative that you return to your primary care physician (or establish a relationship with a primary care physician if you do not have one) for your post hospital discharge needs so that they can reassess your need for medications and monitor your lab values.    Time coordinating discharge: 40 minutes  SIGNED:   Shelly Coss, MD  [facility] 08/01/2020, 1:40 PM Pager [phone]  If 7PM-7AM, please contact night-coverage [email or web address] Password [email or web address]

## 2020-08-01 NOTE — Progress Notes (Signed)
Daughter Elise Benne present on admission

## 2020-08-01 NOTE — Plan of Care (Signed)
  Problem: Elimination: Goal: Will not experience complications related to urinary retention Outcome: Progressing   Problem: Safety: Goal: Ability to remain free from injury will improve Outcome: Progressing   Problem: Skin Integrity: Goal: Risk for impaired skin integrity will decrease Outcome: Progressing   

## 2020-08-01 NOTE — Progress Notes (Addendum)
CT cancelled. Patient will transfer to [facility] ER per Dr. Tawanna Solo. Spoke with [employer] Per Gabriel Cirri [phone], may be 1.5 hours before transport will arrive for pickup. Ok to leave both IVPs in. Family ok to arrive ahead or follow EMS to [facility]. Family at beside updated.   Fuller Mandril, RN

## 2020-08-01 NOTE — Consult Note (Signed)
Neurosurgery-New Consultation Evaluation 08/01/2020 Cheyenne Anthony [id number]  Identifying Statement: Cheyenne Anthony is a 75 y.o. female from [place] [id number] with altered mental status  Physician Requesting Consultation: Dr. Janese Banks  History of Present Illness: Cheyenne Anthony is admitted with altered mental status.  She was found to have a urinary tract infection but also had imaging performed of the head and chest which revealed multiple masses.  There is a cerebellar mass identified with edema.  She appears very sleepy in the room but is arousable.  She denies any pain currently.  She denies any obvious focal weakness.  She is not able to answer other history questions.  Past Medical History:  Past Medical History:  Diagnosis Date  . Hypertension   . Type II diabetes mellitus with renal manifestations [facility])     Social History: Social History   Socioeconomic History  . Marital status: Unknown    Spouse name: Not on file  . Number of children: Not on file  . Years of education: Not on file  . Highest education level: Not on file  Occupational History  . Not on file  Tobacco Use  . Smoking status: Former Research scientist (life sciences)  . Smokeless tobacco: Never Used  Substance and Sexual Activity  . Alcohol use: Never  . Drug use: Never  . Sexual activity: Not on file  Other Topics Concern  . Not on file  Social History Narrative  . Not on file   Social Determinants of Health   Financial Resource Strain: Not on file  Food Insecurity: Not on file  Transportation Needs: Not on file  Physical Activity: Not on file  Stress: Not on file  Social Connections: Not on file  Intimate Partner Violence: Not on file    Family History: Family History  Problem Relation Age of Onset  . Stroke Brother     Review of Systems:  Review of Systems - General ROS: Negative Psychological ROS: Negative Ophthalmic ROS: Negative ENT ROS: Negative Hematological and Lymphatic ROS: Negative  Endocrine ROS:  Negative Respiratory ROS: Negative Cardiovascular ROS: Negative Gastrointestinal ROS: Negative Genito-Urinary ROS: Negative Musculoskeletal ROS: Negative Neurological ROS: Positive for sleepiness, altered mental status Dermatological ROS: Negative  Physical Exam: BP (!) 149/85 (BP Location: Left Wrist)   Pulse 69   Temp 98.7 F (37.1 C) (Oral)   Resp 16   Ht 5\' 5"  (1.651 m)   Wt 79.4 kg   SpO2 98%   BMI 29.12 kg/m  Body mass index is 29.12 kg/m. Body surface area is 1.91 meters squared. General appearance: Alert, cooperative, in no acute distress Head: Normocephalic, atraumatic Eyes: Normal, EOM intact Ext: No edema in LE bilaterally, warm extremities  Neurologic exam:  Mental status: alertness: alert, orientation: person, place, did not know the year affect: normal Speech: fluent and clear, did not participate in naming Cranial nerves:  II: Visual fields appear full but patient concentration decreased III/IV/VI: extra-ocular motions intact bilaterally V/VII:no evidence of facial droop or weakness  VIII: hearing normal Motor:strength symmetric 5/5 in bilateral grip, bicep, tricep.  She is 5-5 strength in bilateral plantarflexion but does not participate in remainder of lower extremities exam Sensory: intact to light touch in all extremities Coordination: Unable to perform Gait: Not tested   Imaging: CT head:4-5 cm mass lesion within the right cerebellum, with areas of internal necrosis. Surrounding vasogenic edema. Mass-effect upon the fourth ventricle with obstructive hydrocephalus of the lateral and third ventricles. Most likely diagnosis is metastatic disease.   Impression/Plan:  Ms.  Anthony is here for evaluation of altered mental status and found on CT of the head to have a large right cerebellar mass.  Given the concern for likely metastasis given the chest CT findings, would recommend imaging of the abdomen and pelvis to look for any other metastases.  She will need  an MRI of the brain to further evaluate for other lesions but given the concern for the large mass and her exam, I do think transfer is the best option to a higher level center for definitive care.  Recommend Decadron 10 mg every 6 hours.  Keep head of bed elevated. Avoid any sedating medications if possible including no pain medication.   1.  Diagnosis: Large cerebellar mass  2.  Plan -Recommend transfer to higher level facility -Decadron 10 mg every 6 hours -MRI brain with and without contrast

## 2020-08-01 NOTE — Evaluation (Signed)
Physical Therapy Evaluation Patient Details Name: Cheyenne Anthony MRN: [id number] DOB: [birth year] Today's Date: 08/01/2020   History of Present Illness  Cheyenne Anthony is a 75 y.o. female with medical history significant of hypertension, diabetes mellitus, former smoker, depression, hyperlipidemia, arthritis of knees, who presented to hospital ED 07/31/2020 with altered mental status and increased urinary frequency. She has generalized weakness, which has been progressively worsening. Patient admitted for UTI with hyokalemia, actue metabolic encephalopathy, abnromal CXR with 5 cm round opacity in RML, LBBB, HTN, type II DM with renal manifestations, elevated troponin (likely demand ischemia). CT chest showed a 5 to 6 cm mass in the right middle lobe concerning for lung cancer with possible lymphangitic spread.  Early right hilar adenopathy 15 mm.  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  Mass-effect upon the fourth ventricle with obstructive hydrocephalus. concerning for metastatic lung cancer.    Clinical Impression  Patient is very drowsy with flat affect and mostly quiet when spoken to, reclining in bed upon arrival. Able to state her name with effort and cuing for both first and last name. Inconsistent with report of PLOF and home setting. Unable to say she is at the hospital or her date of birth. Patient unable to provide history so spoke with daughter, Cheyenne Anthony, on the phone who provided history. Per Cheyenne Anthony, patient lives with her in a single floor apartment with level entry. Prior to about 3 weeks ago, patient ambulated independently in the home and the community with RW and was independent with ADLs, needed some help with IADLs. Cheyenne Anthony works full time but has been caring for her with increasing burdon as she has declined functionally over the last 3 weeks, where she needed help dressing, bathing, and going to the bathroom. Cheyenne Anthony's sister  would come help at lunch when Cheyenne Anthony was unable to stop home during her lunch period. Also had an aide that came to help T and F for a few hours. Upon PT eval, patient is quite lethargic and attempts to follow commands inconsistently. She required total to max A for bed mobility +2 and was unable to attempt standing due to poor trunk control and drowsiness. She was able to sit at edge of bed with min A to prevent falling to the right. When asked to extend knee, was able to move about 1 inch. Patient appears to have experienced a significant decline in functional mobility and independence and is dependent for care at this time. She would benefit from short term rehab prior to returning home. Patient would benefit from skilled physical therapy to address impairments and functional limitations (see PT Problem List below) to work towards stated goals and return to PLOF or maximal functional independence.     Follow Up Recommendations SNF    Equipment Recommendations  Other (comment);Rolling walker with 5" wheels (further needs to be determined in next level of care)    Recommendations for Other Services   OT    Precautions / Restrictions Precautions Precautions: Fall Restrictions Weight Bearing Restrictions: No      Mobility  Bed Mobility Overal bed mobility: Needs Assistance Bed Mobility: Rolling;Sit to Supine;Supine to Sit Rolling: Total assist;+2 for safety/equipment   Supine to sit: HOB elevated;Max assist Sit to supine: Total assist;+2 for physical assistance;HOB elevated;Max assist   General bed mobility comments: Patient attempted to initiate movement when asked to sit up but unable to contribute significantly. Required max A support at  trunk and legs to move supine to sit and total A +2 to move sit to supine and boost up bed. Patient completed rolling with total A with second person placing new chuck under her. Patient very drowsy and with flat affect. Only attempt to follow commands  intermittantly. Sat at edge of bed with min A to prevent toppling over towards the back and right.    Transfers                 General transfer comment: Unsafe to attempt transfer due to poor trunk control in sitting.  Ambulation/Gait                Stairs            Wheelchair Mobility    Modified Rankin (Stroke Patients Only)       Balance Overall balance assessment: Needs assistance Sitting-balance support: Bilateral upper extremity supported;Feet supported Sitting balance-Leahy Scale: Poor Sitting balance - Comments: able to sit at edge of bed for ~ 4 min with min A to prevent falling to the right Postural control: Right lateral lean   Standing balance-Leahy Scale: Zero Standing balance comment: currently unable to stand safely                             Pertinent Vitals/Pain Pain Assessment: Faces Faces Pain Scale: No hurt    Home Living Family/patient expects to be discharged to:: Private residence Living Arrangements: Children (Daughter Cheyenne Anthony who works.) Available Help at Discharge: Family;Available PRN/intermittently (Cheyenne Anthony primary caregiver, sister helps some. Cheyenne Anthony works full time. She had an aide that came T and F) Type of Home: Apartment Home Access: Level entry     Home Layout: One level Home Equipment: Tub bench;Walker - 4 wheels;Cane - single point;Transport chair;Toilet riser;Grab bars - tub/shower;Bedside commode Additional Comments: Lives with daughter, Cheyenne Anthony, who works full time. She had an aide that came T and F    Prior Function Level of Independence: Needs assistance   Gait / Transfers Assistance Needed: Prior to 3 weeks ago  she was ambulating with rollator with assistance in the home and outdoors. 2 months ago she was ambulating in the home and outdoors independently with RW but she started falling at that time (at least 3 times over 14 days).  ADL's / Homemaking Assistance Needed: Patient was independent with  dressing and bathing with additional time about 3 weeks ago.  Comments: Spoke with daughter, Cheyenne Anthony who provided history due to patient being unable due to cognitive impairment.     Hand Dominance   Dominant Hand: Right    Extremity/Trunk Assessment   Upper Extremity Assessment Upper Extremity Assessment: Difficult to assess due to impaired cognition (Patient unable to lift arms off the bed more than a few inches, very slow. able to squeeze R hand ~4/5, left hand ~3/5.)    Lower Extremity Assessment Lower Extremity Assessment: Difficult to assess due to impaired cognition (Grossly 2/5. Attempts to move legs in bed. Attempts to extend knees and wiggle toes but unable to move through ROM.)    Cervical / Trunk Assessment Cervical / Trunk Assessment: Normal  Communication   Communication: No difficulties  Cognition Arousal/Alertness: Lethargic Behavior During Therapy: Flat affect Overall Cognitive Status: Impaired/Different from baseline Area of Impairment: Awareness;Following commands                       Following Commands: Follows one step commands  inconsistently       General Comments: Patient able to state name and attempted to perform activities some of the time when requested by PT. Gave conflicting responses when asked about PLOF and unable to state where she is or her date of birth. Spoke with daughter, Cheyenne Anthony, on the phone who said she was conversing freely and joking with her 3 weeks ago.      General Comments      Exercises Other Exercises Other Exercises: educated patient and daughter, Cheyenne Anthony, on purpose of PT in acute care and discharge reccomendations. Assisted patient with donning gown and don/doff socks (mostly dependent, attempted to put hand through gown arm hole with cuing).   Assessment/Plan    PT Assessment Patient needs continued PT services  PT Problem List Decreased strength;Decreased coordination;Decreased range of motion;Decreased  cognition;Decreased activity tolerance;Decreased knowledge of use of DME;Decreased balance;Decreased safety awareness;Obesity;Decreased mobility;Decreased knowledge of precautions       PT Treatment Interventions DME instruction;Balance training;Gait training;Neuromuscular re-education;Cognitive remediation;Functional mobility training;Patient/family education;Therapeutic activities;Therapeutic exercise    PT Goals (Current goals can be found in the Care Plan section)  Acute Rehab PT Goals Patient Stated Goal: to improve function PT Goal Formulation: With family Time For Goal Achievement: 08/15/20 Potential to Achieve Goals: Fair    Frequency Min 2X/week   Barriers to discharge Inaccessible home environment;Decreased caregiver support patient is currently non-ambulatory and dependent for care. Daughter works full time and is primary caregiver.    Co-evaluation               AM-PAC PT "6 Clicks" Mobility  Outcome Measure Help needed turning from your back to your side while in a flat bed without using bedrails?: Total Help needed moving from lying on your back to sitting on the side of a flat bed without using bedrails?: Total Help needed moving to and from a bed to a chair (including a wheelchair)?: Total Help needed standing up from a chair using your arms (e.g., wheelchair or bedside chair)?: Total Help needed to walk in hospital room?: Total Help needed climbing 3-5 steps with a railing? : Total 6 Click Score: 6    End of Session   Activity Tolerance: Patient tolerated treatment well;Patient limited by lethargy Patient left: in bed;with call bell/phone within reach;with bed alarm set Nurse Communication: Mobility status PT Visit Diagnosis: Muscle weakness (generalized) (M62.81);Unsteadiness on feet (R26.81);Difficulty in walking, not elsewhere classified (R26.2)    Time: 4268-3419 PT Time Calculation (min) (ACUTE ONLY): 32 min   Charges:   PT Evaluation $PT Eval  Moderate Complexity: 1 Mod PT Treatments $Therapeutic Activity: 8-22 mins        Everlean Alstrom. Graylon Good, PT, DPT 08/01/20, 12:25 PM

## 2020-08-01 NOTE — Progress Notes (Addendum)
   08/01/20 1342  Clinical Encounter Type  Visited With Patient and family together  Visit Type Initial  Referral From Nurse  Consult/Referral To Chaplain  Chaplain responded to OR for AD completion. With the assistance of Jill Poling they were able to get two witnesses and a notary and AD was completed. Chaplain talked with Pt and daughters and prayed with them twice. Pt's daughter, [name] was visibly shaken and chaplain hugged her several times. Jill Poling, made copies of AD and gave them to the family.

## 2020-08-01 NOTE — Consult Note (Signed)
Hematology/Oncology Consult note [facility] [phone] [phone]  Patient Care Team: [facility] as PCP - General   Name of the patient: Cheyenne Anthony  [id number]  03-02-1946    Reason for consult: Brain metastases, lung mass   Requesting physician: Dr. Tawanna Solo  Date of visit: 08/01/2020    History of presenting illness-patient is a 75 year old female with a past medical history significant for hypertension, diabetes, hyperlipidemia and obesity who presented to the ER with symptoms of confusion and altered mental status. In the ER patient had a chest x-ray which showed a possible mass in the right middle lobe.  This was followed by CT chest with contrast as well as a CT head.  CT chest showed a 5 to 6 cm mass in the right middle lobe concerning for lung cancer with possible lymphangitic spread.  Early right hilar adenopathy 15 mm.  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  Mass-effect upon the fourth ventricle with obstructive hydrocephalus  Met with patient and daughter at bedside today. Patient in the process of being transferred to [facility]. She is wake but somewhat drowsy. Paucity of thoughts and speech     Review of systems- Review of Systems  Unable to perform ROS: Mental acuity    Not on File  Patient Active Problem List   Diagnosis Date Noted  . UTI (urinary tract infection) 07/31/2020  . Hypokalemia 07/31/2020  . Acute metabolic encephalopathy 39/76/7341  . Abnormal CXR with 5 cm round opacity in RML 07/31/2020  . LBBB (left bundle branch block) 07/31/2020  . Elevated troponin 07/31/2020  . HLD (hyperlipidemia) 07/31/2020  . Depression 07/31/2020  . Hypertension   . Type II diabetes mellitus with renal manifestations [facility])      Past Medical History:  Diagnosis Date  . Hypertension   . Type II diabetes mellitus with renal  manifestations ([place])      History reviewed. No pertinent surgical history.  Social History   Socioeconomic History  . Marital status: Unknown    Spouse name: Not on file  . Number of children: Not on file  . Years of education: Not on file  . Highest education level: Not on file  Occupational History  . Not on file  Tobacco Use  . Smoking status: Former Research scientist (life sciences)  . Smokeless tobacco: Never Used  Substance and Sexual Activity  . Alcohol use: Never  . Drug use: Never  . Sexual activity: Not on file  Other Topics Concern  . Not on file  Social History Narrative  . Not on file   Social Determinants of Health   Financial Resource Strain: Not on file  Food Insecurity: Not on file  Transportation Needs: Not on file  Physical Activity: Not on file  Stress: Not on file  Social Connections: Not on file  Intimate Partner Violence: Not on file     Family History  Problem Relation Age of Onset  . Stroke Brother      Current Facility-Administered Medications:  .  acetaminophen (TYLENOL) suppository 650 mg, 650 mg, Rectal, Q6H PRN, Ivor Costa, MD .  acetaminophen (TYLENOL) tablet 650 mg, 650 mg, Oral, Q6H PRN **OR** acetaminophen (TYLENOL) suppository 650 mg, 650 mg, Rectal, Q6H PRN, Ivor Costa, MD .  atorvastatin (LIPITOR) tablet 40 mg, 40 mg, Oral, Q supper, Adhikari, Amrit, MD .  cefTRIAXone (ROCEPHIN) 1 g in sodium chloride 0.9 % 100  mL IVPB, 1 g, Intravenous, Q24H, Ivor Costa, MD, Stopped at 07/31/20 1421 .  dexamethasone (DECADRON) injection 6 mg, 6 mg, Intravenous, Q6H, Adhikari, Amrit, MD .  DULoxetine (CYMBALTA) DR capsule 60 mg, 60 mg, Oral, Daily, Ivor Costa, MD, 60 mg at 08/01/20 0918 .  hydrALAZINE (APRESOLINE) injection 5 mg, 5 mg, Intravenous, Q2H PRN, Ivor Costa, MD .  insulin aspart (novoLOG) injection 0-5 Units, 0-5 Units, Subcutaneous, QHS, Niu, Xilin, MD .  insulin aspart (novoLOG) injection 0-9 Units, 0-9 Units, Subcutaneous, TID WC, Ivor Costa, MD, 2 Units  at 08/01/20 0912 .  lactated ringers infusion, , Intravenous, Continuous, Ivor Costa, MD, Last Rate: 75 mL/hr at 08/01/20 0657, New Bag at 08/01/20 0657 .  lisinopril (ZESTRIL) tablet 20 mg, 20 mg, Oral, Daily, Ivor Costa, MD, 20 mg at 08/01/20 0917 .  metoprolol succinate (TOPROL-XL) 24 hr tablet 50 mg, 50 mg, Oral, Daily, Ivor Costa, MD, 50 mg at 08/01/20 0918 .  NIFEdipine (PROCARDIA-XL/NIFEDICAL-XL) 24 hr tablet 90 mg, 90 mg, Oral, Daily, Ivor Costa, MD, 90 mg at 08/01/20 0917 .  ondansetron (ZOFRAN) injection 4 mg, 4 mg, Intravenous, Q8H PRN, Ivor Costa, MD .  traMADol (ULTRAM) tablet 50 mg, 50 mg, Oral, Q6H PRN, Ivor Costa, MD, 50 mg at 08/01/20 0918   Physical exam:  Vitals:   08/01/20 0205 08/01/20 0213 08/01/20 0549 08/01/20 0914  BP: (!) 147/80 (!) 147/80 127/76 128/76  Pulse:  80 74 72  Resp: '13 18 16   ' Temp:   98.2 F (36.8 C) 98.5 F (36.9 C)  TempSrc:   Oral Oral  SpO2:  92% 96% 98%  Weight:      Height:       Physical Exam Constitutional:      Comments: Awake but drowsy  Eyes:     Extraocular Movements: EOM normal.  Cardiovascular:     Rate and Rhythm: Normal rate and regular rhythm.     Heart sounds: Normal heart sounds.  Pulmonary:     Effort: Pulmonary effort is normal.     Breath sounds: Normal breath sounds.  Abdominal:     General: Bowel sounds are normal.     Palpations: Abdomen is soft.  Skin:    General: Skin is warm and dry.  Neurological:     Mental Status: She is alert.     Comments: No FND        CMP Latest Ref Rng & Units 08/01/2020  Glucose 70 - 99 mg/dL 171(H)  BUN 8 - 23 mg/dL 8  Creatinine 0.44 - 1.00 mg/dL 0.35(L)  Sodium 135 - 145 mmol/L 136  Potassium 3.5 - 5.1 mmol/L 3.1(L)  Chloride 98 - 111 mmol/L 101  CO2 22 - 32 mmol/L 25  Calcium 8.9 - 10.3 mg/dL 9.1  Total Protein 6.5 - 8.1 g/dL -  Total Bilirubin 0.3 - 1.2 mg/dL -  Alkaline Phos 38 - 126 U/L -  AST 15 - 41 U/L -  ALT 0 - 44 U/L -   CBC Latest Ref Rng & Units  08/01/2020  WBC 4.0 - 10.5 K/uL 8.3  Hemoglobin 12.0 - 15.0 g/dL 12.5  Hematocrit 36.0 - 46.0 % 37.4  Platelets 150 - 400 K/uL 332    '@IMAGES' @  DG Chest 2 View  Result Date: 07/31/2020 CLINICAL DATA:  Pt comes into the ED via EMS from home with c/o increased weakness and confusion. [facility] physician called and states the pt is being treated for UTI and the culture came back  with E. Coli. EXAM: CHEST - 2 VIEW COMPARISON:  None. FINDINGS: Apparent round mass projects within the right middle lobe, approximately 5 cm in size. There is interstitial thickening that is most evident in the lung bases bilaterally. No other masses or areas of confluent lung opacity. No pleural effusion or pneumothorax. Cardiac silhouette is borderline enlarged. No mediastinal or hilar masses. Skeletal structures are demineralized but grossly intact. IMPRESSION: 1. 5 cm rounded opacity in the right middle lobe. This may reflect a mass or rounded pneumonia. Recommend follow-up chest CT with contrast for further assessment. Electronically Signed   By: Lajean Manes M.D.   On: 07/31/2020 12:54   CT HEAD WO CONTRAST  Result Date: 07/31/2020 CLINICAL DATA:  Mental status changes of unknown cause. Weakness and confusion worsening over the last week. EXAM: CT HEAD WITHOUT CONTRAST TECHNIQUE: Contiguous axial images were obtained from the base of the skull through the vertex without intravenous contrast. COMPARISON:  None. 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 There is a mass lesion within the right cerebellum measuring 4-5 cm in diameter, with areas of internal necrosis. Regional vasogenic edema. Mass-effect upon the fourth ventricle with obstructive hydrocephalus of the lateral and third ventricles. Most likely diagnosis is metastatic disease. Primary tumor not excluded but much less  common. Vascular: There is atherosclerotic calcification of the major vessels at the base of the brain. Skull: Normal Sinuses/Orbits: Paranasal sinus opacification on the left likely due to ostiomeatal unit disease. Right-sided sinuses are clear. Orbits negative. Other: None IMPRESSION: 1. 4-5 cm mass lesion within the right cerebellum, with areas of internal necrosis. Surrounding vasogenic edema. Mass-effect upon the fourth ventricle with obstructive hydrocephalus of the lateral and third ventricles. Most likely diagnosis is metastatic disease. Primary tumor not excluded but much less common. 2. Background pattern of age related volume loss, chronic small-vessel ischemic changes of the white matter, and old cortical and subcortical infarction in the right frontal operculum region. 3. Paranasal sinus opacification on the left likely due to ostiomeatal unit disease. Electronically Signed   By: Nelson Chimes M.D.   On: 07/31/2020 19:15   CT CHEST W CONTRAST  Result Date: 07/31/2020 CLINICAL DATA:  Mental status changes. Abnormal chest radiography with density on the right. EXAM: CT CHEST WITH CONTRAST TECHNIQUE: Multidetector CT imaging of the chest was performed during intravenous contrast administration. CONTRAST:  69m OMNIPAQUE IOHEXOL 300 MG/ML  SOLN COMPARISON:  Chest radiography same day FINDINGS: Cardiovascular: Mild cardiomegaly. Coronary artery calcification. Aortic atherosclerosis. No pericardial effusion. Mediastinum/Nodes: No mediastinal lymphadenopathy. Early right hilar lymphadenopathy, the largest node measuring 15 mm. Lungs/Pleura: Right middle lobe mass measuring 5-6 cm in diameter. Lymphangitic spread of carcinoma within the right middle lobe. Broad surface along the pleural surface, but without pleural effusion. No other pulmonary parenchymal lesion. No sign of underlying emphysema. Upper Abdomen: No abnormality seen. Right adrenal gland not included in the region studied. Musculoskeletal:  Negative IMPRESSION: 1. 5-6 cm right middle lobe mass consistent with primary bronchogenic carcinoma. Lymphangitic spread of carcinoma within the right middle lobe. Early right hilar lymphadenopathy, the largest node measuring 15 mm. 2. Cardiomegaly. Coronary artery calcification. 3. Aortic atherosclerosis. Aortic Atherosclerosis (ICD10-I70.0). Electronically Signed   By: MNelson ChimesM.D.   On: 07/31/2020 19:19    Assessment and plan- Patient is a 75y.o. female with multiple comorbidities admitted for altered mental status found  to have a large right cerebellar mass as well as right middle lobe lung mass concerning for metastatic lung cancer  I did get in touch with Dr. Lacinda Axon after I was consulted given that patient has a large solitary cerebellar mass.  He has also evaluated the patient and recommends a transfer to [facility] to see if the patient would be a candidate for surgical resection of this mass.  CT chest does show a primary right middle lobe lung mass and this likely overall represents metastatic lung cancer.  If patient ultimately undergoes resection of the cerebellar mass will have tissue diagnosis at that time.  If surgery is not pursued she will ultimately need radiation to the affected area as well as tissue diagnosis from another site which could be either CT-guided lung biopsy versus any other accessible site based on CT abdomen findings.  I will follow up with the patient upon her discharge from [facility] to coordinate her care further     Visit Diagnosis 1. Altered mental status, unspecified altered mental status type   2. Acute cystitis without hematuria     Dr. Randa Evens, MD, MPH [facility] at [facility] [phone] 08/01/2020 4:37 PM

## 2020-08-01 NOTE — Progress Notes (Signed)
Per patient's daughter [name],  requested chaplain for AD creation STAT. Patient due to transfer soon. [facility]  EMS called.   Fuller Mandril, RN

## 2020-08-01 NOTE — Progress Notes (Signed)
Spoke with Allayne Butcher with [facility] [phone]. Report given.   Fuller Mandril, RN

## 2020-08-01 NOTE — Evaluation (Signed)
Occupational Therapy Evaluation Patient Details Name: Cheyenne Anthony MRN: [id number] DOB: [birth year] Today's Date: 08/01/2020    History of Present Illness Cheyenne Anthony is a 75 y.o. female with medical history significant of hypertension, diabetes mellitus, former smoker, depression, hyperlipidemia, arthritis of knees, who presented to hospital ED 07/31/2020 with altered mental status and increased urinary frequency. She has generalized weakness, which has been progressively worsening. Patient admitted for UTI with hyokalemia, actue metabolic encephalopathy, abnromal CXR with 5 cm round opacity in RML, LBBB, HTN, type II DM with renal manifestations, elevated troponin (likely demand ischemia). CT chest showed a 5 to 6 cm mass in the right middle lobe concerning for lung cancer with possible lymphangitic spread.  Early right hilar adenopathy 15 mm.  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  Mass-effect upon the fourth ventricle with obstructive hydrocephalus. concerning for metastatic lung cancer.   Clinical Impression   Cheyenne Anthony was seen for OT evaluation this date. Prior to hospital admission, pt was MOD I for mobility with recent decline in function. Pt lives with daughter who works. Pt presents to acute OT demonstrating impaired ADL performance and functional mobility 2/2 lethargy, decreased activity tolerance, and functional strength/ROM deficits. Pt accurately states name, does not respond to further questions, eyes closed t/o session. Pt currently requires MOD A self-drinking, MAX A face washing bed level. Deferred mobility - PT reports recently repositioned and required +2 assist. Pt would benefit from skilled OT to address noted impairments and functional limitations (see below for any additional details) in order to maximize safety and independence while minimizing falls risk and caregiver burden. Upon hospital discharge, recommend  STR to maximize pt safety and return to PLOF.     Follow Up Recommendations  SNF    Equipment Recommendations  Other (comment) (TBD)    Recommendations for Other Services       Precautions / Restrictions Precautions Precautions: Fall Restrictions Weight Bearing Restrictions: No      Mobility Bed Mobility Overal bed mobility: Needs Assistance   General bed mobility comments: Deferred - pt recently repositioned c PT (required +2 assist)           ADL either performed or assessed with clinical judgement   ADL Overall ADL's : Needs assistance/impaired                                       General ADL Comments: MOD A self-drinking, MAX A face washing bed level.                  Pertinent Vitals/Pain Pain Assessment: No/denies pain Faces Pain Scale: No hurt     Hand Dominance Right   Extremity/Trunk Assessment Upper Extremity Assessment Upper Extremity Assessment: RUE deficits/detail;LUE deficits/detail RUE Deficits / Details: shoulder flexion ~20*, squeezes hand but unable to achieve full fist LUE Deficits / Details: shoulder flexion ~45*, squeezes hand but unable to achieve full fist   Lower Extremity Assessment Lower Extremity Assessment: Defer to PT evaluation   Cervical / Trunk Assessment Cervical / Trunk Assessment: Normal   Communication Communication Communication: No difficulties   Cognition Arousal/Alertness: Lethargic Behavior During Therapy: Flat affect Overall Cognitive Status: Impaired/Different from baseline Area of Impairment: Awareness;Following commands  Following Commands: Follows one step commands inconsistently       General Comments: States name, does not respond to further questions   General Comments       Exercises Exercises: Other exercises Other Exercises Other Exercises: Pt educated re: OT role, DME recs, importance of mobility for functional strengthening Other  Exercises: face washing and cup mgmt   Shoulder Instructions      Home Living Family/patient expects to be discharged to:: Private residence Living Arrangements: Children (daughter works) Available Help at Discharge: Family;Available PRN/intermittently ([name] primary caregiver, sister helps some. [name] works full time. She had an aide that came T and F) Type of Home: Apartment Home Access: Level entry     Home Layout: One level     Bathroom Shower/Tub: Teacher, early years/pre:  (over commode seat)     Home Equipment: Tub bench;Walker - 4 wheels;Cane - single point;Transport chair;Toilet riser;Grab bars - tub/shower;Bedside commode   Additional Comments: Lives with daughter, [name], who works full time. She had an aide that came T and F      Prior Functioning/Environment Level of Independence: Needs assistance  Gait / Transfers Assistance Needed: Prior to 3 weeks ago  she was ambulating with rollator with assistance in the home and outdoors. 2 months ago she was ambulating in the home and outdoors independently with RW but she started falling at that time (at least 3 times over 14 days). ADL's / Homemaking Assistance Needed: Patient was independent with dressing and bathing with additional time about 3 weeks ago. Communication / Swallowing Assistance Needed: needed no assistance 3 weeks ago Comments: Spoke with daughter, [name] who provided history due to patient being unable due to cognitive impairment.        OT Problem List: Decreased strength;Decreased range of motion;Decreased activity tolerance;Decreased cognition;Decreased safety awareness      OT Treatment/Interventions: Self-care/ADL training;Therapeutic exercise;Energy conservation;DME and/or AE instruction;Therapeutic activities;Patient/family education;Balance training    OT Goals(Current goals can be found in the care plan section) Acute Rehab OT Goals Patient Stated Goal: to improve function OT Goal  Formulation: Patient unable to participate in goal setting Time For Goal Achievement: 08/15/20 Potential to Achieve Goals: Poor ADL Goals Pt Will Perform Eating: with min assist;bed level Pt Will Perform Upper Body Bathing: with min guard assist;sitting Pt Will Transfer to Toilet: with mod assist;with +2 assist (rolling bed level)  OT Frequency: Min 1X/week   Barriers to D/C: Inaccessible home environment;Decreased caregiver support             AM-PAC OT "6 Clicks" Daily Activity     Outcome Measure Help from another person eating meals?: A Lot Help from another person taking care of personal grooming?: A Lot Help from another person toileting, which includes using toliet, bedpan, or urinal?: A Lot Help from another person bathing (including washing, rinsing, drying)?: A Lot Help from another person to put on and taking off regular upper body clothing?: A Lot Help from another person to put on and taking off regular lower body clothing?: A Lot 6 Click Score: 12   End of Session    Activity Tolerance: Patient limited by lethargy Patient left: in bed;with call bell/phone within reach;with bed alarm set  OT Visit Diagnosis: Other abnormalities of gait and mobility (R26.89)                Time: 6568-1275 OT Time Calculation (min): 8 min Charges:  OT General Charges $OT Visit: 1 Visit OT Evaluation $OT  Eval Low Complexity: 1 Low  Dessie Coma, M.S. OTR/L  08/01/20, 1:18 PM  ascom [phone]

## 2020-08-01 NOTE — ED Notes (Signed)
IV bolus complete.  Pt sleeping with daughter at bedside.

## 2020-08-01 NOTE — Progress Notes (Signed)
AVS given to [employer]. IV's remain in patient per [facility] ED. Patient moved to stretcher for transport.   Fuller Mandril, RN

## 2020-08-02 LAB — URINE CULTURE: Culture: >=100000 — AB

## 2020-08-05 LAB — CULTURE, BLOOD (ROUTINE X 2)
Culture: NO GROWTH
Culture: NO GROWTH

## 2020-08-19 ENCOUNTER — Inpatient Hospital Stay
Admission: EM | Admit: 2020-08-19 | Discharge: 2020-08-25 | DRG: 070 | Disposition: A | Discharge status: Skilled Nursing Facility | Payer: Medicare (Managed Care) | Attending: Internal Medicine | Admitting: Internal Medicine

## 2020-08-19 ENCOUNTER — Emergency Department: Payer: Medicare (Managed Care)

## 2020-08-19 ENCOUNTER — Encounter: Payer: Self-pay | Admitting: Intensive Care

## 2020-08-19 ENCOUNTER — Other Ambulatory Visit: Payer: Self-pay

## 2020-08-19 DIAGNOSIS — F32A Depression, unspecified: Secondary | ICD-10-CM | POA: Diagnosis present

## 2020-08-19 DIAGNOSIS — R569 Unspecified convulsions: Secondary | ICD-10-CM | POA: Diagnosis present

## 2020-08-19 DIAGNOSIS — H409 Unspecified glaucoma: Secondary | ICD-10-CM | POA: Diagnosis present

## 2020-08-19 DIAGNOSIS — Z87891 Personal history of nicotine dependence: Secondary | ICD-10-CM

## 2020-08-19 DIAGNOSIS — C3490 Malignant neoplasm of unspecified part of unspecified bronchus or lung: Secondary | ICD-10-CM | POA: Diagnosis present

## 2020-08-19 DIAGNOSIS — Z79899 Other long term (current) drug therapy: Secondary | ICD-10-CM

## 2020-08-19 DIAGNOSIS — Z823 Family history of stroke: Secondary | ICD-10-CM

## 2020-08-19 DIAGNOSIS — R509 Fever, unspecified: Secondary | ICD-10-CM

## 2020-08-19 DIAGNOSIS — C7931 Secondary malignant neoplasm of brain: Secondary | ICD-10-CM | POA: Diagnosis present

## 2020-08-19 DIAGNOSIS — Z20822 Contact with and (suspected) exposure to covid-19: Secondary | ICD-10-CM | POA: Diagnosis present

## 2020-08-19 DIAGNOSIS — G9340 Encephalopathy, unspecified: Secondary | ICD-10-CM

## 2020-08-19 DIAGNOSIS — I639 Cerebral infarction, unspecified: Secondary | ICD-10-CM | POA: Diagnosis present

## 2020-08-19 DIAGNOSIS — G9341 Metabolic encephalopathy: Secondary | ICD-10-CM | POA: Diagnosis not present

## 2020-08-19 DIAGNOSIS — E876 Hypokalemia: Secondary | ICD-10-CM | POA: Diagnosis not present

## 2020-08-19 DIAGNOSIS — G911 Obstructive hydrocephalus: Secondary | ICD-10-CM | POA: Diagnosis present

## 2020-08-19 DIAGNOSIS — E785 Hyperlipidemia, unspecified: Secondary | ICD-10-CM | POA: Diagnosis present

## 2020-08-19 DIAGNOSIS — E2740 Unspecified adrenocortical insufficiency: Secondary | ICD-10-CM | POA: Diagnosis present

## 2020-08-19 DIAGNOSIS — E272 Addisonian crisis: Secondary | ICD-10-CM | POA: Diagnosis present

## 2020-08-19 DIAGNOSIS — Z8673 Personal history of transient ischemic attack (TIA), and cerebral infarction without residual deficits: Secondary | ICD-10-CM

## 2020-08-19 DIAGNOSIS — Z993 Dependence on wheelchair: Secondary | ICD-10-CM

## 2020-08-19 DIAGNOSIS — G936 Cerebral edema: Secondary | ICD-10-CM | POA: Diagnosis present

## 2020-08-19 DIAGNOSIS — I10 Essential (primary) hypertension: Secondary | ICD-10-CM | POA: Diagnosis present

## 2020-08-19 DIAGNOSIS — E872 Acidosis: Secondary | ICD-10-CM | POA: Diagnosis present

## 2020-08-19 DIAGNOSIS — Z6830 Body mass index (BMI) 30.0-30.9, adult: Secondary | ICD-10-CM

## 2020-08-19 DIAGNOSIS — R7989 Other specified abnormal findings of blood chemistry: Secondary | ICD-10-CM | POA: Diagnosis present

## 2020-08-19 DIAGNOSIS — R4182 Altered mental status, unspecified: Secondary | ICD-10-CM

## 2020-08-19 DIAGNOSIS — E669 Obesity, unspecified: Secondary | ICD-10-CM | POA: Diagnosis present

## 2020-08-19 DIAGNOSIS — E1129 Type 2 diabetes mellitus with other diabetic kidney complication: Secondary | ICD-10-CM | POA: Diagnosis present

## 2020-08-19 DIAGNOSIS — G935 Compression of brain: Secondary | ICD-10-CM | POA: Diagnosis present

## 2020-08-19 DIAGNOSIS — I82409 Acute embolism and thrombosis of unspecified deep veins of unspecified lower extremity: Secondary | ICD-10-CM

## 2020-08-19 DIAGNOSIS — E119 Type 2 diabetes mellitus without complications: Secondary | ICD-10-CM | POA: Diagnosis present

## 2020-08-19 HISTORY — DX: Malignant (primary) neoplasm, unspecified: C80.1

## 2020-08-19 HISTORY — DX: Other nonspecific abnormal finding of lung field: R91.8

## 2020-08-19 LAB — CBC
HCT: 33.2 % — ABNORMAL LOW (ref 36.0–46.0)
Hemoglobin: 10.8 g/dL — ABNORMAL LOW (ref 12.0–15.0)
MCH: 28.2 pg (ref 26.0–34.0)
MCHC: 32.5 g/dL (ref 30.0–36.0)
MCV: 86.7 fL (ref 80.0–100.0)
Platelets: 345 10*3/uL (ref 150–400)
RBC: 3.83 MIL/uL — ABNORMAL LOW (ref 3.87–5.11)
RDW: 15.4 % (ref 11.5–15.5)
WBC: 10.3 10*3/uL (ref 4.0–10.5)
nRBC: 0 % (ref 0.0–0.2)

## 2020-08-19 LAB — PROTIME-INR
INR: 1 (ref 0.8–1.2)
Prothrombin Time: 13.1 seconds (ref 11.4–15.2)

## 2020-08-19 LAB — COMPREHENSIVE METABOLIC PANEL
ALT: 11 U/L (ref 0–44)
AST: 19 U/L (ref 15–41)
Albumin: 3.2 g/dL — ABNORMAL LOW (ref 3.5–5.0)
Alkaline Phosphatase: 56 U/L (ref 38–126)
Anion gap: 15 (ref 5–15)
BUN: 28 mg/dL — ABNORMAL HIGH (ref 8–23)
CO2: 24 mmol/L (ref 22–32)
Calcium: 9.3 mg/dL (ref 8.9–10.3)
Chloride: 99 mmol/L (ref 98–111)
Creatinine, Ser: 0.68 mg/dL (ref 0.44–1.00)
GFR, Estimated: >60 mL/min (ref >60)
Glucose, Bld: 218 mg/dL — ABNORMAL HIGH (ref 70–99)
Potassium: 2.8 mmol/L — ABNORMAL LOW (ref 3.5–5.1)
Sodium: 138 mmol/L (ref 135–145)
Total Bilirubin: 1.2 mg/dL (ref 0.3–1.2)
Total Protein: 6.9 g/dL (ref 6.5–8.1)

## 2020-08-19 LAB — MAGNESIUM: Magnesium: 1.8 mg/dL (ref 1.7–2.4)

## 2020-08-19 MED ORDER — POTASSIUM CHLORIDE CRYS ER 20 MEQ PO TBCR
40.0000 meq | EXTENDED_RELEASE_TABLET | Freq: Once | ORAL | Status: AC
  Administered 2020-08-20: 40 meq via ORAL
  Filled 2020-08-19: qty 2

## 2020-08-19 NOTE — ED Notes (Signed)
NP Willette Cluster [phone] call with any questions or concerns.

## 2020-08-19 NOTE — ED Notes (Signed)
Pt changed of urine soiled brief by this RN with assistance from pt daughter. Pt placed in clean pull-up brought by daughter and covered with warm blankets.

## 2020-08-19 NOTE — ED Notes (Signed)
Pt moved to room for privacy. In and out urine catheterization performed by this RN and [employer] representative. Clear yellow urine returned. Specimen sent to lab. Rectal temperature obtained. Pt pull up placed back on. Pt repositioned, covered with blankets, and moved back into hallway.

## 2020-08-19 NOTE — ED Triage Notes (Addendum)
Daughter showed up at [facility] today and noticed patient was altered past her normal. Baseline knows her name and birthday and able to hold conversation. Today not able to hold conversation and told her daughter she does not feel good. 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 January 5th and 7th patient received brain surgery per daughter. Patient oriented to self, place, and situation. Disoriented to time in triage. Speech clear and able to answer for herself. Cancer treatment has not been started yet. Patient is currently on blood thinners since brain surgery. Patient unable to stand per daughter

## 2020-08-19 NOTE — ED Provider Notes (Incomplete)
[facility] Emergency Department Provider Note  ____________________________________________   Event Date/Time   First MD Initiated Contact with Patient 08/19/20 2255     (approximate)  I have reviewed the triage vital signs and the nursing notes.   HISTORY  Chief Complaint Altered Mental Status    HPI Cheyenne Anthony is a 75 y.o. 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  On review of patient's outside records, it appears patient was admitted to the neurosurgical ICU at [facility] and underwent EVD and suboccipital craniotomy for resection of right cerebellar tumor 08/02/2020.  Return to the operating room on 08/04/2020 for secondary suboccipital craniotomy for resection of residual tumor.  EVD was removed on 08/05/2020.  Daughter reports prior to being admitted at [facility] patient was living at home.  She is now at [place] facility.  Daughter states that today she went to visit the patient at the nursing facility she noticed the patient did not seem to be acting right.  She states normally when she enters the room the patient will greet very lively manner and did not today.  She states that the tone of her voice was "low".  She states that she was concerned that the patient felt warm to touch.  She has had a dry cough and diarrhea.  Temperature at the nursing facility was 99.6 and patient had a negative rapid COVID test.  She has been vaccinated for COVID-19.  No vomiting.  Daughter is concerned that she has had complaints of pain in both of her legs.  She is wheelchair-bound at baseline.  No known falls.        Past Medical History:  Diagnosis Date  . Brain mass 07/31/2020  . Cancer ([place])   .  Hypertension   . Right lower lobe lung mass   . Type II diabetes mellitus with renal manifestations [facility])     Patient Active Problem List   Diagnosis Date Noted  . Cerebellar mass   . Lung mass   . UTI (urinary tract infection) 07/31/2020  . Hypokalemia 07/31/2020  . Acute metabolic encephalopathy 95/18/8416  . Abnormal CXR with 5 cm round opacity in RML 07/31/2020  . LBBB (left bundle branch block) 07/31/2020  . Elevated troponin 07/31/2020  . HLD (hyperlipidemia) 07/31/2020  . Depression 07/31/2020  . Hypertension   . Type II diabetes mellitus with renal manifestations ([place])     History reviewed. No pertinent surgical history.  Prior to Admission medications   Medication Sig Start Date End Date Taking? Authorizing Provider  atorvastatin (LIPITOR) 40 MG tablet Take 40 mg by mouth daily with supper.    [provider]  capsaicin (ZOSTRIX) 0.025 % cream Apply 1 application topically 2 (two) times daily as needed (joint pain). (apply to right shoulder and knees)    [provider]  cefTRIAXone 1 g in sodium chloride 0.9 % 100 mL Inject 1 g into the vein daily. 08/01/20   Shelly Coss, MD  dexamethasone (DECADRON) 10 MG/ML injection Inject 1 mL (10 mg total) into the vein every 6 (six) hours. 08/01/20   Shelly Coss, MD  DULoxetine (CYMBALTA) 60 MG capsule Take 60 mg by mouth daily.    [provider]  hydrocortisone cream 1 % Apply 1  application topically 3 (three) times daily as needed for itching.    [provider]  latanoprost (XALATAN) 0.005 % ophthalmic solution Place 1 drop into both eyes at bedtime.    [provider]  lidocaine (LIDODERM) 5 % Place 1 patch onto the skin daily. Remove & Discard patch within 12 hours or as directed by MD    [provider]  lisinopril-hydrochlorothiazide (ZESTORETIC) 20-25 MG tablet Take 1 tablet by mouth daily.    [provider]  metoprolol succinate (TOPROL-XL) 50 MG 24 hr  tablet Take 50 mg by mouth daily. Take with or immediately following a meal.    [provider]  NIFEdipine (ADALAT CC) 90 MG 24 hr tablet Take 90 mg by mouth daily.    [provider]  sitaGLIPtin-metformin (JANUMET) 50-1000 MG tablet Take 1 tablet by mouth 2 (two) times daily.    [provider]  Skin Protectants, Misc. (EUCERIN) cream Apply 1 application topically daily as needed for dry skin.    [provider]  traMADol (ULTRAM) 50 MG tablet Take 100 mg by mouth daily.    [provider]    Allergies Patient has no known allergies.  Family History  Problem Relation Age of Onset  . Stroke Brother     Social History Social History   Tobacco Use  . Smoking status: Former Smoker    Types: Cigarettes    Quit date: 07/31/2020    Years since quitting: 0.0  . Smokeless tobacco: Never Used  Substance Use Topics  . Alcohol use: Not Currently    Comment: occ  . Drug use: Never    Review of Systems Constitutional: Subjective fever. Eyes: No visual changes. ENT: No sore throat. Cardiovascular: Denies chest pain. Respiratory: Denies shortness of breath. Gastrointestinal: No nausea, vomiting, diarrhea. Genitourinary: Negative for dysuria. Musculoskeletal: Negative for back pain. Skin: Negative for rash. Neurological: Negative for focal weakness or numbness.  ____________________________________________   PHYSICAL EXAM:  VITAL SIGNS: ED Triage Vitals  Enc Vitals Group     BP 08/19/20 1236 113/68     Pulse Rate 08/19/20 1236 98     Resp 08/19/20 1236 16     Temp 08/19/20 1236 98.6 F (37 C)     Temp Source 08/19/20 1236 Oral     SpO2 08/19/20 1236 94 %     Weight 08/19/20 1237 175 lb (79.4 kg)     Height 08/19/20 1237 5\' 4"  (1.626 m)     Head Circumference --      Peak Flow --      Pain Score 08/19/20 1237 0     Pain Loc --      Pain Edu? --      Excl. in [place]? --    CONSTITUTIONAL: Alert and oriented x3 and responds  appropriately to questions.  Elderly.  Feels warm to touch. HEAD: Normocephalic, appears atraumatic EYES: Conjunctivae clear, pupils appear equal, EOM appear intact ENT: normal nose; moist mucous membranes NECK: Supple, normal ROM CARD: Regular and tachycardic; S1 and S2 appreciated; no murmurs, no clicks, no rubs, no gallops RESP: Normal chest excursion without splinting or tachypnea; breath sounds clear and equal bilaterally; no wheezes, no rhonchi, no rales, no hypoxia or respiratory distress, speaking full sentences ABD/GI: Normal bowel sounds; non-distended; soft, non-tender, no rebound, no guarding, no peritoneal signs, no hepatosplenomegaly BACK: The back appears normal EXT: Normal ROM in all joints; no deformity noted, no edema; no cyanosis SKIN: Normal color for age and  race; warm; no rash on exposed skin NEURO: Gross weakness in all 4 extremities without focal deficit.  Normal sensation diffusely.  Cranial nerves II through XII intact.  Normal speech. PSYCH: The patient's mood and manner are appropriate.  ____________________________________________   LABS (all labs ordered are listed, but only abnormal results are displayed)  Labs Reviewed  COMPREHENSIVE METABOLIC PANEL - Abnormal; Notable for the following components:      Result Value   Potassium 2.8 (*)    Glucose, Bld 218 (*)    BUN 28 (*)    Albumin 3.2 (*)    All other components within normal limits  CBC - Abnormal; Notable for the following components:   RBC 3.83 (*)    Hemoglobin 10.8 (*)    HCT 33.2 (*)    All other components within normal limits  URINE CULTURE  SARS CORONAVIRUS 2 BY RT PCR (HOSPITAL ORDER, [facility])  PROTIME-INR  URINALYSIS, COMPLETE (UACMP) WITH MICROSCOPIC  MAGNESIUM   ____________________________________________  EKG   EKG Interpretation  Date/Time:  Saturday August 19 2020 12:37:42 EST Ventricular Rate:  100 PR Interval:  156 QRS Duration: 124 QT  Interval:  384 QTC Calculation: 495 R Axis:   -44 Text Interpretation: Normal sinus rhythm Left axis deviation Minimal voltage criteria for LVH, may be normal variant ( Cornell product ) Anterior infarct , age undetermined ST & T wave abnormality, consider lateral ischemia Abnormal ECG Confirmed by Pryor Curia [phone]) on 08/19/2020 11:02:20 PM       ____________________________________________  RADIOLOGY Jessie Foot Ward, personally viewed and evaluated these images (plain radiographs) as part of my medical decision making, as well as reviewing the written report by the radiologist.  ED MD interpretation:  ***  Official radiology report(s): CT Head Wo Contrast  Result Date: 08/19/2020 CLINICAL DATA:  Delirium. EXAM: CT HEAD WITHOUT CONTRAST TECHNIQUE: Contiguous axial images were obtained from the base of the skull through the vertex without intravenous contrast. COMPARISON:  July 31, 2020 FINDINGS: Brain: Mass of right cerebellum is unchanged compared to prior exam. 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 Mild chronic bilateral periventricular white matter small vessel ischemic changes are noted. There is no acute hemorrhage, midline shift, or hydrocephalus. Vascular: No hyperdense vessel is noted. Skull: Status post prior right occipital craniectomy unchanged. Sinuses/Orbits: No acute finding. Other: None. IMPRESSION: 1. No acute hemorrhage or infarct identified. 2. Mass of right cerebellum is unchanged compared to prior exam. 3. 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 Electronically Signed   By: Abelardo Diesel M.D.   On: 08/19/2020 14:02    ____________________________________________   PROCEDURES  Procedure(s) performed (including Critical Care):  Procedures  CRITICAL CARE Performed by: Cyril Mourning Ward   Total critical care time: *** minutes  Critical care time was exclusive of separately billable procedures and  treating other patients.  Critical care was necessary to treat or prevent imminent or life-threatening deterioration.  Critical care was time spent personally by me on the following activities: development of treatment plan with patient and/or surrogate as well as nursing, discussions with consultants, evaluation of patient's response to treatment, examination of patient, obtaining history from patient or surrogate, ordering and performing treatments and interventions, ordering and review of laboratory studies, ordering and review of radiographic studies, pulse oximetry and re-evaluation of patient's condition.  ____________________________________________   INITIAL IMPRESSION / ASSESSMENT AND PLAN / ED COURSE  As part of my medical decision making,  I reviewed the following data within the [place] {[id number]::"XBLTJ from prior ED visits","[place] Controlled Substance Database"}         Patient here with concerns for change in mental status.  She has no focal neurologic deficits but does have global weakness.  This seems to be chronic for patient.  CT head obtained which shows no hemorrhage, infarct.  No new masses, edema noted.  Have low suspicion that patient has had a stroke today.  Patient does feel very warm to touch.  I am concerned that there could be infectious etiology present such as pneumonia, UTI, COVID-19 causing his symptoms.  Doubt meningitis, encephalitis.  She denies headache and has no meningismus.  Will obtain rectal temperature, urinalysis and urine culture, chest x-ray and COVID swab.  Patient's potassium level is low at 2.8.  Will replace and check magnesium level.  No EKG changes.         ____________________________________________   FINAL CLINICAL IMPRESSION(S) / ED DIAGNOSES  Final diagnoses:  None     ED Discharge Orders    None      *Please note:  Malyna Budney was evaluated in Emergency Department on 08/19/2020 for the symptoms  described in the history of present illness. She was evaluated in the context of the global COVID-19 pandemic, which necessitated consideration that the patient might be at risk for infection with the SARS-CoV-2 virus that causes COVID-19. Institutional protocols and algorithms that pertain to the evaluation of patients at risk for COVID-19 are in a state of rapid change based on information released by regulatory bodies including the CDC and federal and state organizations. These policies and algorithms were followed during the patient's care in the ED.  Some ED evaluations and interventions may be delayed as a result of limited staffing during and the pandemic.*   Note:  This document was prepared using Dragon voice recognition software and may include unintentional dictation errors.

## 2020-08-19 NOTE — ED Provider Notes (Signed)
[facility] Emergency Department Provider Note  ____________________________________________   Event Date/Time   First MD Initiated Contact with Patient 08/19/20 2255     (approximate)  I have reviewed the triage vital signs and the nursing notes.   HISTORY  Chief Complaint Altered Mental Status    HPI Cheyenne Anthony is a 75 y.o. 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  Return to the operating room on 08/04/2020 for secondary suboccipital craniotomy for resection of residual tumor.  EVD was removed on 08/05/2020.  Daughter reports prior to being admitted at [facility] patient was living at home.  She is now at [facility] facility.  Daughter states that today she went to visit the patient at the nursing facility she noticed the patient did not seem to be acting right.  She states normally when she enters the room the patient will greet very lively manner and did not today.  She states that the tone of her voice was "low".  She states that she was concerned that the patient felt warm to touch.  She has had a dry cough and diarrhea.  Temperature at the nursing facility was 99.6 and patient had a negative rapid COVID test.  She has been vaccinated for COVID-19.  No vomiting.  Daughter is concerned that she has had complaints of pain in both of her legs.  She is wheelchair-bound at baseline.  No known falls.        Past Medical History:  Diagnosis Date  . Brain mass 07/31/2020  . Cancer ([place])   .  Hypertension   . Right lower lobe lung mass   . Type II diabetes mellitus with renal manifestations [facility])     Patient Active Problem List   Diagnosis Date Noted  . Cerebellar mass   . Lung mass   . UTI (urinary tract infection) 07/31/2020  . Hypokalemia 07/31/2020  . Acute metabolic encephalopathy 52/77/8242  . Abnormal CXR with 5 cm round opacity in RML 07/31/2020  . LBBB (left bundle branch block) 07/31/2020  . Elevated troponin 07/31/2020  . HLD (hyperlipidemia) 07/31/2020  . Depression 07/31/2020  . Hypertension   . Type II diabetes mellitus with renal manifestations ([place])     History reviewed. No pertinent surgical history.  Prior to Admission medications   Medication Sig Start Date End Date Taking? Authorizing Provider  atorvastatin (LIPITOR) 40 MG tablet Take 40 mg by mouth daily with supper.    [provider]  capsaicin (ZOSTRIX) 0.025 % cream Apply 1 application topically 2 (two) times daily as needed (joint pain). (apply to right shoulder and knees)    [provider]  cefTRIAXone 1 g in sodium chloride 0.9 % 100 mL Inject 1 g into the vein daily. 08/01/20   Shelly Coss, MD  dexamethasone (DECADRON) 10 MG/ML injection Inject 1 mL (10 mg total) into the vein every 6 (six) hours. 08/01/20   Shelly Coss, MD  DULoxetine (CYMBALTA) 60 MG capsule Take 60 mg by mouth daily.    [provider]  hydrocortisone cream 1 % Apply 1  application topically 3 (three) times daily as needed for itching.    [provider]  latanoprost (XALATAN) 0.005 % ophthalmic solution Place 1 drop into both eyes at bedtime.    [provider]  lidocaine (LIDODERM) 5 % Place 1 patch onto the skin daily. Remove & Discard patch within 12 hours or as directed by MD    [provider]  lisinopril-hydrochlorothiazide (ZESTORETIC) 20-25 MG tablet Take 1 tablet by mouth daily.    [provider]  metoprolol succinate (TOPROL-XL) 50 MG 24 hr  tablet Take 50 mg by mouth daily. Take with or immediately following a meal.    [provider]  NIFEdipine (ADALAT CC) 90 MG 24 hr tablet Take 90 mg by mouth daily.    [provider]  sitaGLIPtin-metformin (JANUMET) 50-1000 MG tablet Take 1 tablet by mouth 2 (two) times daily.    [provider]  Skin Protectants, Misc. (EUCERIN) cream Apply 1 application topically daily as needed for dry skin.    [provider]  traMADol (ULTRAM) 50 MG tablet Take 100 mg by mouth daily.    [provider]    Allergies Patient has no known allergies.  Family History  Problem Relation Age of Onset  . Stroke Brother     Social History Social History   Tobacco Use  . Smoking status: Former Smoker    Types: Cigarettes    Quit date: 07/31/2020    Years since quitting: 0.0  . Smokeless tobacco: Never Used  Substance Use Topics  . Alcohol use: Not Currently    Comment: occ  . Drug use: Never    Review of Systems Constitutional: Subjective fever. Eyes: No visual changes. ENT: No sore throat. Cardiovascular: Denies chest pain. Respiratory: Denies shortness of breath. Gastrointestinal: No nausea, vomiting, diarrhea. Genitourinary: Negative for dysuria. Musculoskeletal: Negative for back pain. Skin: Negative for rash. Neurological: Negative for focal weakness or numbness.  ____________________________________________   PHYSICAL EXAM:  VITAL SIGNS: ED Triage Vitals  Enc Vitals Group     BP 08/19/20 1236 113/68     Pulse Rate 08/19/20 1236 98     Resp 08/19/20 1236 16     Temp 08/19/20 1236 98.6 F (37 C)     Temp Source 08/19/20 1236 Oral     SpO2 08/19/20 1236 94 %     Weight 08/19/20 1237 175 lb (79.4 kg)     Height 08/19/20 1237 5\' 4"  (1.626 m)     Head Circumference --      Peak Flow --      Pain Score 08/19/20 1237 0     Pain Loc --      Pain Edu? --      Excl. in [place]? --    CONSTITUTIONAL: Alert and oriented x3 and responds  appropriately to questions.  Elderly.  Feels warm to touch. HEAD: Normocephalic, appears atraumatic; incisions to patient's scalp are clean, dry and intact without surrounding redness, warmth and no drainage or bleeding EYES: Conjunctivae clear, pupils appear equal, EOM appear intact ENT: normal nose; moist mucous membranes NECK: Supple, normal ROM, no meningismus CARD: Regular and tachycardic; S1 and S2 appreciated; no murmurs, no clicks, no rubs, no gallops RESP: Normal chest excursion without splinting or tachypnea; breath sounds clear and equal bilaterally; no wheezes, no rhonchi, no rales, no hypoxia or respiratory distress, speaking full sentences ABD/GI: Normal bowel sounds; non-distended; soft, non-tender, no rebound, no guarding, no peritoneal signs, no hepatosplenomegaly BACK: The back appears  normal EXT: Normal ROM in all joints; no deformity noted, no edema; no cyanosis SKIN: Normal color for age and race; warm; no rash on exposed skin NEURO: Gross weakness in all 4 extremities without focal deficit.  Normal sensation diffusely.  Cranial nerves II through XII intact.  Normal speech. PSYCH: The patient's mood and manner are appropriate.  ____________________________________________   LABS (all labs ordered are listed, but only abnormal results are displayed)  Labs Reviewed  COMPREHENSIVE METABOLIC PANEL - Abnormal; Notable for the following components:      Result Value   Potassium 2.8 (*)    Glucose, Bld 218 (*)    BUN 28 (*)    Albumin 3.2 (*)    All other components within normal limits  CBC - Abnormal; Notable for the following components:   RBC 3.83 (*)    Hemoglobin 10.8 (*)    HCT 33.2 (*)    All other components within normal limits  URINALYSIS, COMPLETE (UACMP) WITH MICROSCOPIC - Abnormal; Notable for the following components:   Color, Urine YELLOW (*)    APPearance HAZY (*)    Leukocytes,Ua TRACE (*)    All other components within normal limits  LACTIC ACID,  PLASMA - Abnormal; Notable for the following components:   Lactic Acid, Venous 2.5 (*)    All other components within normal limits  SARS CORONAVIRUS 2 BY RT PCR (HOSPITAL ORDER, [place] [facility])  URINE CULTURE  CULTURE, BLOOD (ROUTINE X 2)  CULTURE, BLOOD (ROUTINE X 2)  PROTIME-INR  MAGNESIUM   ____________________________________________  EKG   EKG Interpretation  Date/Time:  Saturday August 19 2020 12:37:42 EST Ventricular Rate:  100 PR Interval:  156 QRS Duration: 124 QT Interval:  384 QTC Calculation: 495 R Axis:   -44 Text Interpretation: Normal sinus rhythm Left axis deviation Minimal voltage criteria for LVH, may be normal variant ( Cornell product ) Anterior infarct , age undetermined ST & T wave abnormality, consider lateral ischemia Abnormal ECG Confirmed by Pryor Curia [id number]) on 08/19/2020 11:02:20 PM       ____________________________________________  RADIOLOGY Jessie Foot Shamaria Kavan, personally viewed and evaluated these images (plain radiographs) as part of my medical decision making, as well as reviewing the written report by the radiologist.  ED MD interpretation: Chest x-ray shows no infiltrate.  CT head and MRI brain showed no acute abnormality.  Patient has multiple postoperative changes seen on MRI of her brain.  Official radiology report(s): DG Chest 2 View  Result Date: 08/20/2020 CLINICAL DATA:  Fever and altered mental status. EXAM: CHEST - 2 VIEW COMPARISON:  07/31/2020 FINDINGS: Shallow inspiration. Heart size and pulmonary vascularity are normal. Mass or rounded area of consolidation in the right middle lung measuring 5.5 cm diameter, unchanged since prior studies. Left lung is clear. No pleural effusions. No pneumothorax. Calcification of the aorta. Degenerative changes in the spine and shoulders. IMPRESSION: Right middle lobe mass is unchanged. No developing consolidation or edema. Electronically Signed   By: Lucienne Capers M.D.    On: 08/20/2020 00:24   CT Head Wo Contrast  Result Date: 08/19/2020 CLINICAL DATA:  Delirium. EXAM: CT HEAD WITHOUT CONTRAST TECHNIQUE: Contiguous axial images were obtained from the base of the skull through the vertex without intravenous contrast. COMPARISON:  July 31, 2020 FINDINGS: Brain: Mass of right cerebellum is unchanged compared to prior exam. 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 Mild chronic bilateral periventricular white matter small vessel ischemic changes are noted.  There is no acute hemorrhage, midline shift, or hydrocephalus. Vascular: No hyperdense vessel is noted. Skull: Status post prior right occipital craniectomy unchanged. Sinuses/Orbits: No acute finding. Other: None. IMPRESSION: 1. No acute hemorrhage or infarct identified. 2. Mass of right cerebellum is unchanged compared to prior exam. 3. 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 Electronically Signed   By: Abelardo Diesel M.D.   On: 08/19/2020 14:02   MR Brain W and Wo Contrast  Result Date: 08/20/2020 CLINICAL DATA:  Initial evaluation for acute altered mental status. EXAM: MRI HEAD WITHOUT AND WITH CONTRAST TECHNIQUE: Multiplanar, multiecho pulse sequences of the brain and surrounding structures were obtained without and with intravenous contrast. CONTRAST:  7.35mL GADAVIST GADOBUTROL 1 MMOL/ML IV SOLN COMPARISON:  Comparison made with prior head CT from 08/19/2020 as well as previous CT from 07/31/2020. FINDINGS: Brain: Postoperative changes from recent right suboccipital craniotomy for resection of a right cerebellar mass are seen. Postoperative changes in blood products present within the resection cavity at the right cerebellum. Previously seen vasogenic edema within the right cerebellum and posterior fossa is markedly improved as compared to previous CT from 07/31/2020, with wide patency of the fourth ventricle now seen. Small amount of residual vasogenic edema is  seen within the right cerebellum (series 16, image 9). The tumor itself has been largely resected, with no convincing evidence for residual or locally recurrent tumor at this time, although small tumor could conceivably be obscured by residual postoperative T1 hyperintensity within this region. Note is made of a small curvilinear area of infarction along the margin of the resection cavity (series 5, images 11, 10). Normal expected postoperative dural thickening and enhancement seen about the cerebellum and overlying the right cerebral hemisphere. There is a small subdural hematoma overlying the left occipital convexity measuring no more than 2-3 mm in maximal thickness (series 9, image 13). No significant mass effect. Single punctate focus of possible enhancement noted at the posterior left frontotemporal region (series 20, image 14), nonspecific, but could reflect a tiny metastatic implant versus vascular focus of enhancement. No other convincing evidence for metastatic disease elsewhere within the brain. No other abnormal enhancement or mass lesion. Linear diffusion signal abnormality seen along the course of a prior ventriculostomy tract at the anterior right frontal lobe (series 5, image 34). No other evidence for acute or subacute ischemia elsewhere within the brain. Chronic right MCA distribution infarct involving the right frontal operculum noted. Underlying moderate chronic microvascular ischemic disease. No midline shift or hydrocephalus. Pituitary gland and suprasellar region within normal limits. Midline structures intact. Vascular: Major intracranial vascular flow voids are grossly maintained at the skull base, although evaluation limited by motion artifact. Skull and upper cervical spine: Craniocervical junction within normal limits. Bone marrow signal intensity normal. No focal marrow replacing lesion. Post craniotomy changes present at the right suboccipital region without adverse features.  Sinuses/Orbits: Globes and orbital soft tissues within normal limits. Scattered mucosal thickening noted within the left ethmoidal air cells and left maxillary sinus. Paranasal sinuses are otherwise largely clear. Trace right mastoid effusion noted, of doubtful significance. Other: None. IMPRESSION: 1. Postoperative changes from recent right suboccipital craniotomy for resection of a right cerebellar mass. No appreciable residual or locally recurrent tumor. Previously seen vasogenic edema within the right cerebellum is markedly improved, with only mild edema now seen within this region. 2. Small acute to subacute ischemic infarct along the margin of the resection cavity as above. 3. 2-3 mm subdural hematoma overlying the  left occipital convexity without significant mass effect, presumably postoperative. 4. Single punctate focus of possible enhancement involving the posterior left frontotemporal region, nonspecific, but could reflect a tiny metastatic implant versus vascular focus of enhancement. Attention at follow-up recommended. 5. No other acute intracranial abnormality. 6. 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 Electronically Signed   By: Jeannine Boga M.D.   On: 08/20/2020 03:55    ____________________________________________   PROCEDURES  Procedure(s) performed (including Critical Care):  Procedures  CRITICAL CARE Performed by: Cyril Mourning Sweetie Giebler   Total critical care time: 65 minutes  Critical care time was exclusive of separately billable procedures and treating other patients.  Critical care was necessary to treat or prevent imminent or life-threatening deterioration.  Critical care was time spent personally by me on the following activities: development of treatment plan with patient and/or surrogate as well as nursing, discussions with consultants, evaluation of patient's response to treatment, examination of patient,  obtaining history from patient or surrogate, ordering and performing treatments and interventions, ordering and review of laboratory studies, ordering and review of radiographic studies, pulse oximetry and re-evaluation of patient's condition.  ____________________________________________   INITIAL IMPRESSION / ASSESSMENT AND PLAN / ED COURSE  As part of my medical decision making, I reviewed the following data within the [place] History obtained from family, Nursing notes reviewed and incorporated, Labs reviewed and show minimally elevated lactic mild hypokalemia, Old chart reviewed, Radiograph reviewed showing no infiltrate or edema, Discussed with admitting physician Dr. Damita Dunnings, A consult was requested and obtained from this/these consultant(s) Neurosurgery and Notes from prior ED visits         Patient here with concerns for change in mental status.  She has no focal neurologic deficits but does have global weakness.  This seems to be chronic for patient.  CT head obtained which shows no hemorrhage, infarct.  No new masses, edema noted.  Have low suspicion that patient has had a stroke today.  Patient does feel very warm to touch.  I am concerned that there could be infectious etiology present such as pneumonia, UTI, COVID-19 causing his symptoms.  Doubt meningitis, encephalitis.  She denies headache and has no meningismus.  Will obtain rectal temperature, urinalysis and urine culture, chest x-ray and COVID swab.  Patient's potassium level is low at 2.8.  Will replace and check magnesium level.  No EKG changes.      12:30 AM  On reevaluation, patient is less responsive and not answering questions but is awake and moving all extremities.  Daughter reports this is abnormal for her.  She is febrile here.  Her urine does not appear grossly infected.  Her chest x-ray shows no infiltrate and a stable right-sided mass.  COVID test pending.  We will add on lactate and blood  cultures.  4:10 AM  Pt's lactate minimally elevated but this appears to be a chronic issue for patient.  Her COVID test is negative.  Obtain an MRI of her brain with and without contrast to evaluate for stroke, worsening mass or edema, infectious etiology.  MRI brain shows postoperative changes with no appreciable residual or locally recurrent tumor.  Previously seen vasogenic edema has improved with only mild edema in this area.  She does have a small acute subacute ischemic infarct along the margin of the resection cavity.  She also has a 2 to 3 mm subdural hematoma over the left occipital convexity likely postoperative.  No other acute intracranial  abnormality.  Discussed patient with Dr. Lacinda Axon on-call for neurosurgery.  Appreciate his help.  He reports MRI findings are all typical after surgery including the acute/subacute stroke.  He recommends neurology consultation in the morning and EEG.  Does not feel that an LP has to be done emergently.  Does not think that patient has to be transferred to [facility] as she does not need surgical intervention at this time.  Recommends medicine admission.  4:53 AM Discussed patient's case with hospitalist, Dr. Damita Dunnings.  I have recommended admission and patient (and family if present) agree with this plan. Admitting physician will place admission orders.   Hospitalist to place patient on antibiotics and consult ID and neurology.  I reviewed all nursing notes, vitals, pertinent previous records and reviewed/interpreted all EKGs, lab and urine results, imaging (as available).   ____________________________________________   FINAL CLINICAL IMPRESSION(S) / ED DIAGNOSES  Final diagnoses:  Fever, unspecified fever cause  Hypokalemia  Altered mental status, unspecified altered mental status type     ED Discharge Orders    None      *Please note:  Naesha Buckalew was evaluated in Emergency Department on 08/20/2020 for the symptoms described in the history of present  illness. She was evaluated in the context of the global COVID-19 pandemic, which necessitated consideration that the patient might be at risk for infection with the SARS-CoV-2 virus that causes COVID-19. Institutional protocols and algorithms that pertain to the evaluation of patients at risk for COVID-19 are in a state of rapid change based on information released by regulatory bodies including the CDC and federal and state organizations. These policies and algorithms were followed during the patient's care in the ED.  Some ED evaluations and interventions may be delayed as a result of limited staffing during and the pandemic.*   Note:  This document was prepared using Dragon voice recognition software and may include unintentional dictation errors.   Quayshaun Hubbert, Delice Bison, DO 08/20/20 [phone]

## 2020-08-20 ENCOUNTER — Inpatient Hospital Stay: Payer: Medicare (Managed Care)

## 2020-08-20 ENCOUNTER — Encounter: Payer: Self-pay | Admitting: Radiology

## 2020-08-20 ENCOUNTER — Emergency Department: Payer: Medicare (Managed Care)

## 2020-08-20 DIAGNOSIS — Z87891 Personal history of nicotine dependence: Secondary | ICD-10-CM | POA: Diagnosis not present

## 2020-08-20 DIAGNOSIS — Z993 Dependence on wheelchair: Secondary | ICD-10-CM | POA: Diagnosis not present

## 2020-08-20 DIAGNOSIS — E669 Obesity, unspecified: Secondary | ICD-10-CM | POA: Diagnosis present

## 2020-08-20 DIAGNOSIS — C7931 Secondary malignant neoplasm of brain: Secondary | ICD-10-CM

## 2020-08-20 DIAGNOSIS — R531 Weakness: Secondary | ICD-10-CM | POA: Diagnosis not present

## 2020-08-20 DIAGNOSIS — I6930 Unspecified sequelae of cerebral infarction: Secondary | ICD-10-CM | POA: Diagnosis not present

## 2020-08-20 DIAGNOSIS — E272 Addisonian crisis: Secondary | ICD-10-CM | POA: Diagnosis present

## 2020-08-20 DIAGNOSIS — Z20822 Contact with and (suspected) exposure to covid-19: Secondary | ICD-10-CM | POA: Diagnosis present

## 2020-08-20 DIAGNOSIS — E119 Type 2 diabetes mellitus without complications: Secondary | ICD-10-CM | POA: Diagnosis present

## 2020-08-20 DIAGNOSIS — Z823 Family history of stroke: Secondary | ICD-10-CM | POA: Diagnosis not present

## 2020-08-20 DIAGNOSIS — Z9889 Other specified postprocedural states: Secondary | ICD-10-CM | POA: Insufficient documentation

## 2020-08-20 DIAGNOSIS — Z6830 Body mass index (BMI) 30.0-30.9, adult: Secondary | ICD-10-CM | POA: Diagnosis not present

## 2020-08-20 DIAGNOSIS — I639 Cerebral infarction, unspecified: Secondary | ICD-10-CM

## 2020-08-20 DIAGNOSIS — C3490 Malignant neoplasm of unspecified part of unspecified bronchus or lung: Secondary | ICD-10-CM | POA: Diagnosis present

## 2020-08-20 DIAGNOSIS — G935 Compression of brain: Secondary | ICD-10-CM | POA: Diagnosis present

## 2020-08-20 DIAGNOSIS — E876 Hypokalemia: Secondary | ICD-10-CM | POA: Diagnosis present

## 2020-08-20 DIAGNOSIS — Z79899 Other long term (current) drug therapy: Secondary | ICD-10-CM | POA: Diagnosis not present

## 2020-08-20 DIAGNOSIS — E872 Acidosis: Secondary | ICD-10-CM | POA: Diagnosis present

## 2020-08-20 DIAGNOSIS — G9340 Encephalopathy, unspecified: Secondary | ICD-10-CM | POA: Diagnosis not present

## 2020-08-20 DIAGNOSIS — E785 Hyperlipidemia, unspecified: Secondary | ICD-10-CM | POA: Diagnosis present

## 2020-08-20 DIAGNOSIS — R509 Fever, unspecified: Secondary | ICD-10-CM | POA: Diagnosis not present

## 2020-08-20 DIAGNOSIS — Z8673 Personal history of transient ischemic attack (TIA), and cerebral infarction without residual deficits: Secondary | ICD-10-CM | POA: Diagnosis not present

## 2020-08-20 DIAGNOSIS — G936 Cerebral edema: Secondary | ICD-10-CM | POA: Diagnosis present

## 2020-08-20 DIAGNOSIS — R569 Unspecified convulsions: Secondary | ICD-10-CM

## 2020-08-20 DIAGNOSIS — G9689 Other specified disorders of central nervous system: Secondary | ICD-10-CM | POA: Diagnosis not present

## 2020-08-20 DIAGNOSIS — E2740 Unspecified adrenocortical insufficiency: Secondary | ICD-10-CM | POA: Diagnosis not present

## 2020-08-20 DIAGNOSIS — I10 Essential (primary) hypertension: Secondary | ICD-10-CM | POA: Diagnosis present

## 2020-08-20 DIAGNOSIS — H409 Unspecified glaucoma: Secondary | ICD-10-CM | POA: Diagnosis present

## 2020-08-20 DIAGNOSIS — G911 Obstructive hydrocephalus: Secondary | ICD-10-CM | POA: Diagnosis present

## 2020-08-20 DIAGNOSIS — G9341 Metabolic encephalopathy: Secondary | ICD-10-CM | POA: Diagnosis present

## 2020-08-20 DIAGNOSIS — F32A Depression, unspecified: Secondary | ICD-10-CM | POA: Diagnosis present

## 2020-08-20 DIAGNOSIS — B999 Unspecified infectious disease: Secondary | ICD-10-CM | POA: Diagnosis not present

## 2020-08-20 LAB — URINALYSIS, COMPLETE (UACMP) WITH MICROSCOPIC
Bacteria, UA: NONE SEEN
Bilirubin Urine: NEGATIVE
Glucose, UA: NEGATIVE mg/dL
Hgb urine dipstick: NEGATIVE
Ketones, ur: NEGATIVE mg/dL
Nitrite: NEGATIVE
Protein, ur: NEGATIVE mg/dL
Specific Gravity, Urine: 1.025 (ref 1.005–1.030)
pH: 5 (ref 5.0–8.0)

## 2020-08-20 LAB — CBC WITH DIFFERENTIAL/PLATELET
Abs Immature Granulocytes: 0.07 10*3/uL (ref 0.00–0.07)
Basophils Absolute: 0 10*3/uL (ref 0.0–0.1)
Basophils Relative: 0 %
Eosinophils Absolute: 0 10*3/uL (ref 0.0–0.5)
Eosinophils Relative: 0 %
HCT: 31.7 % — ABNORMAL LOW (ref 36.0–46.0)
Hemoglobin: 10.2 g/dL — ABNORMAL LOW (ref 12.0–15.0)
Immature Granulocytes: 1 %
Lymphocytes Relative: 12 %
Lymphs Abs: 1 10*3/uL (ref 0.7–4.0)
MCH: 28 pg (ref 26.0–34.0)
MCHC: 32.2 g/dL (ref 30.0–36.0)
MCV: 87.1 fL (ref 80.0–100.0)
Monocytes Absolute: 0.2 10*3/uL (ref 0.1–1.0)
Monocytes Relative: 2 %
Neutro Abs: 6.7 10*3/uL (ref 1.7–7.7)
Neutrophils Relative %: 85 %
Platelets: 292 10*3/uL (ref 150–400)
RBC: 3.64 MIL/uL — ABNORMAL LOW (ref 3.87–5.11)
RDW: 15.3 % (ref 11.5–15.5)
WBC: 8 10*3/uL (ref 4.0–10.5)
nRBC: 0 % (ref 0.0–0.2)

## 2020-08-20 LAB — COMPREHENSIVE METABOLIC PANEL
ALT: 12 U/L (ref 0–44)
AST: 16 U/L (ref 15–41)
Albumin: 3.1 g/dL — ABNORMAL LOW (ref 3.5–5.0)
Alkaline Phosphatase: 56 U/L (ref 38–126)
Anion gap: 12 (ref 5–15)
BUN: 18 mg/dL (ref 8–23)
CO2: 23 mmol/L (ref 22–32)
Calcium: 8.8 mg/dL — ABNORMAL LOW (ref 8.9–10.3)
Chloride: 103 mmol/L (ref 98–111)
Creatinine, Ser: 0.53 mg/dL (ref 0.44–1.00)
GFR, Estimated: >60 mL/min (ref >60)
Glucose, Bld: 267 mg/dL — ABNORMAL HIGH (ref 70–99)
Potassium: 3.6 mmol/L (ref 3.5–5.1)
Sodium: 138 mmol/L (ref 135–145)
Total Bilirubin: 0.9 mg/dL (ref 0.3–1.2)
Total Protein: 6.7 g/dL (ref 6.5–8.1)

## 2020-08-20 LAB — CBG MONITORING, ED
Glucose-Capillary: 224 mg/dL — ABNORMAL HIGH (ref 70–99)
Glucose-Capillary: 326 mg/dL — ABNORMAL HIGH (ref 70–99)
Glucose-Capillary: 244 mg/dL — ABNORMAL HIGH (ref 70–99)

## 2020-08-20 LAB — LACTIC ACID, PLASMA
Lactic Acid, Venous: 2.5 mmol/L (ref 0.5–1.9)
Lactic Acid, Venous: 1.2 mmol/L (ref 0.5–1.9)

## 2020-08-20 LAB — SARS CORONAVIRUS 2 BY RT PCR (HOSPITAL ORDER, PERFORMED IN ~~LOC~~ HOSPITAL LAB): SARS Coronavirus 2: NEGATIVE

## 2020-08-20 LAB — PHOSPHORUS: Phosphorus: 2.3 mg/dL — ABNORMAL LOW (ref 2.5–4.6)

## 2020-08-20 LAB — MAGNESIUM: Magnesium: 1.7 mg/dL (ref 1.7–2.4)

## 2020-08-20 MED ORDER — VANCOMYCIN HCL IN DEXTROSE 1-5 GM/200ML-% IV SOLN
1000.0000 mg | Freq: Two times a day (BID) | INTRAVENOUS | Status: AC
  Administered 2020-08-20 – 2020-08-22 (×5): 1000 mg via INTRAVENOUS
  Filled 2020-08-20 (×7): qty 200

## 2020-08-20 MED ORDER — DEXAMETHASONE SODIUM PHOSPHATE 10 MG/ML IJ SOLN
10.0000 mg | Freq: Once | INTRAMUSCULAR | Status: AC
  Administered 2020-08-20: 10 mg via INTRAVENOUS
  Filled 2020-08-20: qty 1

## 2020-08-20 MED ORDER — INSULIN ASPART 100 UNIT/ML ~~LOC~~ SOLN: 0.0000 [IU] | Freq: Three times a day (TID) | SUBCUTANEOUS | Status: DC

## 2020-08-20 MED ORDER — POTASSIUM PHOSPHATES 15 MMOLE/5ML IV SOLN
10.0000 mmol | Freq: Once | INTRAVENOUS | Status: AC
  Administered 2020-08-20: 10 mmol via INTRAVENOUS
  Filled 2020-08-20: qty 3.33

## 2020-08-20 MED ORDER — ACETAMINOPHEN 500 MG PO TABS
1000.0000 mg | ORAL_TABLET | Freq: Once | ORAL | Status: AC
  Administered 2020-08-20: 1000 mg via ORAL
  Filled 2020-08-20: qty 2

## 2020-08-20 MED ORDER — SODIUM CHLORIDE 0.9 % IV SOLN: INTRAVENOUS | Status: DC

## 2020-08-20 MED ORDER — VANCOMYCIN HCL IN DEXTROSE 1-5 GM/200ML-% IV SOLN
1000.0000 mg | Freq: Once | INTRAVENOUS | Status: AC
  Administered 2020-08-20: 1000 mg via INTRAVENOUS
  Filled 2020-08-20: qty 200

## 2020-08-20 MED ORDER — SODIUM CHLORIDE 0.9 % IV SOLN
75.0000 mL/h | INTRAVENOUS | Status: DC
  Administered 2020-08-20 – 2020-08-23 (×5): 75 mL/h via INTRAVENOUS

## 2020-08-20 MED ORDER — INSULIN ASPART 100 UNIT/ML ~~LOC~~ SOLN: 0.0000 [IU] | Freq: Every day | SUBCUTANEOUS | Status: DC

## 2020-08-20 MED ORDER — SODIUM CHLORIDE 0.9 % IV BOLUS (SEPSIS)
1000.0000 mL | Freq: Once | INTRAVENOUS | Status: AC
  Administered 2020-08-20: 1000 mL via INTRAVENOUS

## 2020-08-20 MED ORDER — ENOXAPARIN SODIUM 40 MG/0.4ML ~~LOC~~ SOLN: 40.0000 mg | SUBCUTANEOUS | Status: DC

## 2020-08-20 MED ORDER — GADOBUTROL 1 MMOL/ML IV SOLN
7.5000 mL | Freq: Once | INTRAVENOUS | Status: AC | PRN
  Administered 2020-08-20: 7.5 mL via INTRAVENOUS

## 2020-08-20 MED ORDER — LORAZEPAM 2 MG/ML IJ SOLN: 1.0000 mg | INTRAMUSCULAR | Status: DC | PRN

## 2020-08-20 MED ORDER — INSULIN ASPART 100 UNIT/ML ~~LOC~~ SOLN
0.0000 [IU] | SUBCUTANEOUS | Status: DC
  Administered 2020-08-20 (×2): 5 [IU] via SUBCUTANEOUS
  Administered 2020-08-20: 11 [IU] via SUBCUTANEOUS
  Administered 2020-08-21: 5 [IU] via SUBCUTANEOUS
  Administered 2020-08-21: 3 [IU] via SUBCUTANEOUS
  Administered 2020-08-21: 8 [IU] via SUBCUTANEOUS
  Administered 2020-08-21: 5 [IU] via SUBCUTANEOUS
  Administered 2020-08-21: 3 [IU] via SUBCUTANEOUS
  Administered 2020-08-22: 5 [IU] via SUBCUTANEOUS
  Administered 2020-08-22: 2 [IU] via SUBCUTANEOUS
  Administered 2020-08-22 (×2): 3 [IU] via SUBCUTANEOUS
  Administered 2020-08-22: 5 [IU] via SUBCUTANEOUS
  Administered 2020-08-22 – 2020-08-23 (×3): 2 [IU] via SUBCUTANEOUS
  Filled 2020-08-20 (×15): qty 1

## 2020-08-20 MED ORDER — ONDANSETRON HCL 4 MG PO TABS: 4.0000 mg | ORAL_TABLET | Freq: Four times a day (QID) | ORAL | Status: DC | PRN

## 2020-08-20 MED ORDER — GADOBUTROL 1 MMOL/ML IV SOLN
7.0000 mL | Freq: Once | INTRAVENOUS | Status: AC | PRN
  Administered 2020-08-20: 7.5 mL via INTRAVENOUS

## 2020-08-20 MED ORDER — ACETAMINOPHEN 325 MG PO TABS
650.0000 mg | ORAL_TABLET | Freq: Four times a day (QID) | ORAL | Status: DC | PRN
  Administered 2020-08-22 – 2020-08-24 (×2): 650 mg via ORAL
  Filled 2020-08-20 (×2): qty 2

## 2020-08-20 MED ORDER — ACETAMINOPHEN 650 MG RE SUPP: 650.0000 mg | Freq: Four times a day (QID) | RECTAL | Status: DC | PRN

## 2020-08-20 MED ORDER — MAGNESIUM SULFATE 2 GM/50ML IV SOLN
2.0000 g | Freq: Once | INTRAVENOUS | Status: AC
  Administered 2020-08-20: 2 g via INTRAVENOUS
  Filled 2020-08-20: qty 50

## 2020-08-20 MED ORDER — ONDANSETRON HCL 4 MG/2ML IJ SOLN: 4.0000 mg | Freq: Four times a day (QID) | INTRAMUSCULAR | Status: DC | PRN

## 2020-08-20 MED ORDER — SODIUM CHLORIDE 0.9 % IV SOLN
2.0000 g | Freq: Two times a day (BID) | INTRAVENOUS | Status: DC
  Administered 2020-08-20 – 2020-08-21 (×3): 2 g via INTRAVENOUS
  Filled 2020-08-20 (×3): qty 20

## 2020-08-20 NOTE — ED Notes (Signed)
Pt taken to private room to check for urination. Pt is currently dry. New brief applied.

## 2020-08-20 NOTE — Progress Notes (Signed)
Pharmacy Antibiotic Note  Cheyenne Anthony is a 75 y.o. female admitted on 08/19/2020 with meningitis./ sepsis Pharmacy has been consulted for Vancomycin dosing.  Plan: Vancomycin 1gm IV q12hrs (per nomogram, target trough 15-20) Rocephin 2gm IV q12hrs per MD  Height: 5\' 4"  (162.6 cm) Weight: 79.4 kg (175 lb) IBW/kg (Calculated) : 54.7  Temp (24hrs), Avg:99.6 F (37.6 C), Min:98.6 F (37 C), Max:100.6 F (38.1 C)  Recent Labs  Lab 08/19/20 1255 08/20/20 0039  WBC 10.3  --   CREATININE 0.68  --   LATICACIDVEN  --  2.5*    Estimated Creatinine Clearance: 63.9 mL/min (by C-G formula based on SCr of 0.68 mg/dL).    No Known Allergies  Antimicrobials this admission:   >>    >>   Dose adjustments this admission:   Microbiology results:  BCx:   UCx:    Sputum:    MRSA PCR:   Thank you for allowing pharmacy to be a part of this patient's care.  Hart Robinsons A 08/20/2020 5:33 AM

## 2020-08-20 NOTE — H&P (Addendum)
History and Physical    Cheyenne Anthony QTM:[id number] DOB: 12/29/1945 DOA: 08/19/2020  PCP: [place]   Patient coming from: SNF  I have personally briefly reviewed patient's old medical records in [place]  Chief Complaint: Altered mental status  HPI: Cheyenne Anthony is a 75 y.o. 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  According to the daughter, she went to visit her mother at the [place] facility where she noticed that she was not acting herself and appeared to have a fever.  Rapid COVID test at the facility was negative.  She did have a dry cough but no shortness of breath. ED Course: In the emergency room, rectal temperature of 100.6, pulse 105, BP 131/79, O2 sat 96% on room air.  Blood work: WBC normal at 10,000 and hemoglobin 10.8.  Lactic acid elevated at 2.5.  CMP with potassium of 2.8 but otherwise unremarkable.  Urinalysis sterile. EKG as reviewed by me : Sinus tachycardia at 100 with nonspecific ST-T wave changes Imaging: MRI brain showing small acute to subacute ischemic infarct along the margin of the resection cavity, small 2 to 3 cm subdural hematoma Ephriam Jenkins over left occipital condyle fixity and postoperative changes from recent right suboccipital craniotomy with no appreciable residual or locally recurrent tumor.  Improvement in previous vasogenic edema.  The emergency room provider spoke with neurosurgeon on-call, Dr. Lacinda Axon who stated that all findings appear to be in keeping with her recent surgical procedure and he recommended EEG and neurology consult for possible seizure.  Patient had no  meningeal signs and LP was not thought necessary by Dr. Lacinda Axon per conversation with ED provider.  Hospitalist consulted for admission.  Review of Systems: As per HPI otherwise all other systems on review of systems negative.    Past Medical History:  Diagnosis Date  . Brain mass 07/31/2020  . Cancer ([place])   . Hypertension   . Right lower lobe lung mass   . Type II diabetes mellitus with renal manifestations ([place])     History reviewed. No pertinent surgical history.   reports that she quit smoking about 2 weeks ago. Her smoking use included cigarettes. She has never used smokeless tobacco. She reports previous alcohol use. She reports that she does not use drugs.  No Known Allergies  Family History  Problem Relation Age of Onset  . Stroke Brother       Prior to Admission medications   Medication Sig Start Date End Date Taking? Authorizing Provider  atorvastatin (LIPITOR) 40 MG tablet Take 40 mg by mouth daily with supper.    [provider]  capsaicin (ZOSTRIX) 0.025 % cream Apply 1 application topically 2 (two) times daily as needed (joint pain). (apply to right shoulder and knees)    [provider]  cefTRIAXone 1 g in sodium chloride 0.9 % 100 mL Inject 1 g into the vein daily. 08/01/20   Shelly Coss, MD  dexamethasone (DECADRON) 10 MG/ML injection Inject 1 mL (10 mg total) into the vein every 6 (six) hours. 08/01/20   Shelly Coss, MD  DULoxetine (CYMBALTA) 60 MG capsule Take 60 mg by mouth daily.    [provider]  hydrocortisone  cream 1 % Apply 1 application topically 3 (three) times daily as needed for itching.    [provider]  latanoprost (XALATAN) 0.005 % ophthalmic solution Place 1 drop into both eyes at bedtime.    [provider]  lidocaine (LIDODERM) 5 % Place 1 patch onto the skin daily. Remove & Discard patch within 12 hours or as directed by MD    [provider]  lisinopril-hydrochlorothiazide (ZESTORETIC)  20-25 MG tablet Take 1 tablet by mouth daily.    [provider]  metoprolol succinate (TOPROL-XL) 50 MG 24 hr tablet Take 50 mg by mouth daily. Take with or immediately following a meal.    [provider]  NIFEdipine (ADALAT CC) 90 MG 24 hr tablet Take 90 mg by mouth daily.    [provider]  sitaGLIPtin-metformin (JANUMET) 50-1000 MG tablet Take 1 tablet by mouth 2 (two) times daily.    [provider]  Skin Protectants, Misc. (EUCERIN) cream Apply 1 application topically daily as needed for dry skin.    [provider]  traMADol (ULTRAM) 50 MG tablet Take 100 mg by mouth daily.    [provider]    Physical Exam: Vitals:   08/19/20 1629 08/19/20 1905 08/19/20 2344 08/20/20 0335  BP: 123/67 126/74 131/79 118/77  Pulse: (!) 104 (!) 107 (!) 105 90  Resp: 20 20 16 17   Temp:   (!) 100.6 F (38.1 C)   TempSrc:   Rectal   SpO2: 94% 97% 96% 98%  Weight:      Height:         Vitals:   08/19/20 1629 08/19/20 1905 08/19/20 2344 08/20/20 0335  BP: 123/67 126/74 131/79 118/77  Pulse: (!) 104 (!) 107 (!) 105 90  Resp: 20 20 16 17   Temp:   (!) 100.6 F (38.1 C)   TempSrc:   Rectal   SpO2: 94% 97% 96% 98%  Weight:      Height:          Constitutional: Alert and oriented x 2 . Not in any apparent distress HEENT:      Head: Normocephalic and atraumatic.         Eyes: PERLA, EOMI, Conjunctivae are normal. Sclera is non-icteric.       Mouth/Throat: Mucous membranes are moist.       Neck: Supple with no signs of meningismus. Cardiovascular: Regular rate and rhythm. No murmurs, gallops, or rubs. 2+ symmetrical distal pulses are present . No JVD. No LE edema Respiratory: Respiratory effort normal .Lungs sounds clear bilaterally. No wheezes, crackles, or rhonchi.  Gastrointestinal: Soft, non tender, and non distended with positive bowel sounds.  Genitourinary: No CVA tenderness. Musculoskeletal: Nontender with normal range of motion  in all extremities. No cyanosis, or erythema of extremities. Neurologic:  Face is symmetric. Moving all extremities. No gross focal neurologic deficits . Skin: Skin is warm, dry.  No rash or ulcers Psychiatric: Mood and affect are normal    Labs on Admission: I have personally reviewed following labs and imaging studies  CBC: Recent Labs  Lab 08/19/20 1255  WBC 10.3  HGB 10.8*  HCT 33.2*  MCV 86.7  PLT 174   Basic Metabolic Panel: Recent Labs  Lab 08/19/20 1255  NA 138  K 2.8*  CL 99  CO2 24  GLUCOSE 218*  BUN 28*  CREATININE 0.68  CALCIUM 9.3  MG 1.8   GFR: Estimated Creatinine Clearance: 63.9 mL/min (by C-G formula based on SCr of  0.68 mg/dL). Liver Function Tests: Recent Labs  Lab 08/19/20 1255  AST 19  ALT 11  ALKPHOS 56  BILITOT 1.2  PROT 6.9  ALBUMIN 3.2*   No results for input(s): LIPASE, AMYLASE in the last 168 hours. No results for input(s): AMMONIA in the last 168 hours. Coagulation Profile: Recent Labs  Lab 08/19/20 1255  INR 1.0   Cardiac Enzymes: No results for input(s): CKTOTAL, CKMB, CKMBINDEX, TROPONINI in the last 168 hours. BNP (last 3 results) No results for input(s): PROBNP in the last 8760 hours. HbA1C: No results for input(s): HGBA1C in the last 72 hours. CBG: No results for input(s): GLUCAP in the last 168 hours. Lipid Profile: No results for input(s): CHOL, HDL, LDLCALC, TRIG, CHOLHDL, LDLDIRECT in the last 72 hours. Thyroid Function Tests: No results for input(s): TSH, T4TOTAL, FREET4, T3FREE, THYROIDAB in the last 72 hours. Anemia Panel: No results for input(s): VITAMINB12, FOLATE, FERRITIN, TIBC, IRON, RETICCTPCT in the last 72 hours. Urine analysis:    Component Value Date/Time   COLORURINE YELLOW (A) 08/19/2020 2346   APPEARANCEUR HAZY (A) 08/19/2020 2346   LABSPEC 1.025 08/19/2020 2346   PHURINE 5.0 08/19/2020 2346   GLUCOSEU NEGATIVE 08/19/2020 2346   HGBUR NEGATIVE 08/19/2020 2346   BILIRUBINUR NEGATIVE  08/19/2020 2346   KETONESUR NEGATIVE 08/19/2020 2346   PROTEINUR NEGATIVE 08/19/2020 2346   NITRITE NEGATIVE 08/19/2020 2346   LEUKOCYTESUR TRACE (A) 08/19/2020 2346    Radiological Exams on Admission: DG Chest 2 View  Result Date: 08/20/2020 CLINICAL DATA:  Fever and altered mental status. EXAM: CHEST - 2 VIEW COMPARISON:  07/31/2020 FINDINGS: Shallow inspiration. Heart size and pulmonary vascularity are normal. Mass or rounded area of consolidation in the right middle lung measuring 5.5 cm diameter, unchanged since prior studies. Left lung is clear. No pleural effusions. No pneumothorax. Calcification of the aorta. Degenerative changes in the spine and shoulders. IMPRESSION: Right middle lobe mass is unchanged. No developing consolidation or edema. Electronically Signed   By: Lucienne Capers M.D.   On: 08/20/2020 00:24   CT Head Wo Contrast  Result Date: 08/19/2020 CLINICAL DATA:  Delirium. EXAM: CT HEAD WITHOUT CONTRAST TECHNIQUE: Contiguous axial images were obtained from the base of the skull through the vertex without intravenous contrast. COMPARISON:  July 31, 2020 FINDINGS: Brain: Mass of right cerebellum is unchanged compared to prior exam. 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 Mild chronic bilateral periventricular white matter small vessel ischemic changes are noted. There is no acute hemorrhage, midline shift, or hydrocephalus. Vascular: No hyperdense vessel is noted. Skull: Status post prior right occipital craniectomy unchanged. Sinuses/Orbits: No acute finding. Other: None. IMPRESSION: 1. No acute hemorrhage or infarct identified. 2. Mass of right cerebellum is unchanged compared to prior exam. 3. 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 Electronically Signed   By: Abelardo Diesel M.D.   On: 08/19/2020 14:02   MR Brain W and Wo Contrast  Result Date: 08/20/2020 CLINICAL DATA:  Initial evaluation for acute altered mental  status. EXAM: MRI HEAD WITHOUT AND WITH CONTRAST TECHNIQUE: Multiplanar, multiecho pulse sequences of the brain and surrounding structures were obtained without and with intravenous contrast. CONTRAST:  7.37mL GADAVIST GADOBUTROL 1 MMOL/ML IV SOLN COMPARISON:  Comparison made with prior head CT from 08/19/2020 as well as previous CT from 07/31/2020. FINDINGS: Brain: Postoperative changes from recent right suboccipital craniotomy for resection of a right cerebellar mass are seen. Postoperative changes in blood products present within the  resection cavity at the right cerebellum. Previously seen vasogenic edema within the right cerebellum and posterior fossa is markedly improved as compared to previous CT from 07/31/2020, with wide patency of the fourth ventricle now seen. Small amount of residual vasogenic edema is seen within the right cerebellum (series 16, image 9). The tumor itself has been largely resected, with no convincing evidence for residual or locally recurrent tumor at this time, although small tumor could conceivably be obscured by residual postoperative T1 hyperintensity within this region. Note is made of a small curvilinear area of infarction along the margin of the resection cavity (series 5, images 11, 10). Normal expected postoperative dural thickening and enhancement seen about the cerebellum and overlying the right cerebral hemisphere. There is a small subdural hematoma overlying the left occipital convexity measuring no more than 2-3 mm in maximal thickness (series 9, image 13). No significant mass effect. Single punctate focus of possible enhancement noted at the posterior left frontotemporal region (series 20, image 14), nonspecific, but could reflect a tiny metastatic implant versus vascular focus of enhancement. No other convincing evidence for metastatic disease elsewhere within the brain. No other abnormal enhancement or mass lesion. Linear diffusion signal abnormality seen along the  course of a prior ventriculostomy tract at the anterior right frontal lobe (series 5, image 34). No other evidence for acute or subacute ischemia elsewhere within the brain. Chronic right MCA distribution infarct involving the right frontal operculum noted. Underlying moderate chronic microvascular ischemic disease. No midline shift or hydrocephalus. Pituitary gland and suprasellar region within normal limits. Midline structures intact. Vascular: Major intracranial vascular flow voids are grossly maintained at the skull base, although evaluation limited by motion artifact. Skull and upper cervical spine: Craniocervical junction within normal limits. Bone marrow signal intensity normal. No focal marrow replacing lesion. Post craniotomy changes present at the right suboccipital region without adverse features. Sinuses/Orbits: Globes and orbital soft tissues within normal limits. Scattered mucosal thickening noted within the left ethmoidal air cells and left maxillary sinus. Paranasal sinuses are otherwise largely clear. Trace right mastoid effusion noted, of doubtful significance. Other: None. IMPRESSION: 1. Postoperative changes from recent right suboccipital craniotomy for resection of a right cerebellar mass. No appreciable residual or locally recurrent tumor. Previously seen vasogenic edema within the right cerebellum is markedly improved, with only mild edema now seen within this region. 2. Small acute to subacute ischemic infarct along the margin of the resection cavity as above. 3. 2-3 mm subdural hematoma overlying the left occipital convexity without significant mass effect, presumably postoperative. 4. Single punctate focus of possible enhancement involving the posterior left frontotemporal region, nonspecific, but could reflect a tiny metastatic implant versus vascular focus of enhancement. Attention at follow-up recommended. 5. No other acute intracranial abnormality. 6. Chronic right MCA territory infarct  with underlying moderate chronic microvascular ischemic disease. Electronically Signed   By: Jeannine Boga M.D.   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 just in case (discussed with neurology, if neurosurgery okay with LP, then only CSF lactate and culture should be ordered as other test results will be confounded by recent surgery) - IV fluids, empiric antibiotics for meningitis - EEG per recommendation of neurosurgery - Per neurology, if ,seizure activity, recommend loading with keppra 20mg /kg - Neurologic checks with fall and aspiration precautions - Ativan as needed seizure - - Neurology and neurosurgery consults  Possible sepsis, possible meningitis - Patient with fever, tachycardia and elevated lactic acid of 2.5.  Chest x-ray and urinalysis unremarkable. -IV fluids - Empiric antibiotics for possible meningitis though no meningismus on exam    Lung cancer with cerebellar metastases s/p craniotomy 08/02/20 at [facility] -Stable findings on chest x-ray and MRI - MRI brain showing small acute to subacute ischemic  infarct along the margin of the resection cavity, small 2 to 3 cm subdural hematoma Ephriam Jenkins over left occipital condyle fixity and postoperative changes from recent right suboccipital craniotomy with no appreciable residual or locally recurrent tumor.  Improvement in previous vasogenic edema. - Chest x-ray:Right middle lobe mass is unchanged. 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 This is judged to be reasonable and necessary in order to provide the required intensity of service to ensure the patient's safety given the presenting symptoms, physical exam findings, and initial radiographic and laboratory data in the context of their  Comorbid conditions.   Patient requires inpatient status due to high intensity of service, high risk for further deterioration and high frequency of surveillance required.   I certify that at the point of admission it is my clinical judgment that the patient will require inpatient hospital care spanning beyond [place] MD [facility]     08/20/2020, 5:27 AM

## 2020-08-20 NOTE — ED Notes (Signed)
Patient taken to [facility] Room to change brief. Stool and urine in brief. Patient cleaned and linens changed.

## 2020-08-20 NOTE — ED Notes (Signed)
Insulin was not administered by previous RN. Will collect new sample and administer Insulin as ordered per sliding scale.

## 2020-08-20 NOTE — Progress Notes (Signed)
Care started prior to midnight in the emergency room and patient was admitted early this morning after midnight by Dr. Judd Gaudier and I am in current agreement with her assessment and plan.  Additional changes to the plan of care been made accordingly.  The patient is a 75 year old obese female with a past medical history significant for but not limited to diabetes mellitus type 2, hypertension, recently diagnosed with primary lung cancer with mets to the brain specifically the cerebellum and at that time presented with brainstem compression hydrocephalus and was symptomatic for altered mental status.  She is status post suboccipital craniotomy and [facility] on 08/02/2020 and discharged on 08/09/2020 to SNF.  She is brought from her SNF to the emergency room due to altered mental status.  Patient's daughter went to go visit her mother at [place] facility where she was not acting herself and appeared to have a fever.  Rapid COVID test at the facility was negative but she did have a dry cough but no shortness of breath.  In the emergency room she was noted to have a rectal temperature of 100.6.  Further imaging done showed a small acute to subacute ischemic infarct along the margins of the resection cavity and a small 2 to 3 cm subdural hematoma over the left occipital condyle fixity and postoperative changes from her recent right suboccipital craniotomy with no appreciable residual or local recurrent tumor.  Neurosurgery on-call Dr. Lacinda Axon was consulted and stated that all her findings appear to be in keeping with her recent surgical procedure and recommended a EEG and neurology consult for possible seizure.  Initially patient had no meningeal signs and LP was not thought necessary by Dr. Lacinda Axon.  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 just in case (discussed with neurology, if neurosurgery okay with LP, then only CSF lactate and culture should be ordered as other test results will be confounded by recent surgery) -I spoke to neurology and neurosurgery and Dr. Crawford Givens feels it is okay from a neurosurgical perspective to perform LP on this patient and LP is j to be performed under fluoroscopy - IV fluids, empiric antibiotics for meningitis - EEG per recommendation of neurosurgery pending - Per neurology, if ,seizure activity, recommend loading with keppra 20mg /kg -Neurology is also ordering some additional work-up and obtaining MRIs of the spine specifically cervical, lumbar and thoracic and lower extremity Dopplers - Neurologic checks with fall and aspiration precautions - Ativan as needed seizure - Appreciate specialist help with neurology and neurosurgery consultation  Possible sepsis, possible meningitis - Patient with fever, tachycardia and elevated lactic acid of 2.5 with a possible source of infection.  Chest x-ray and urinalysis unremarkable. -Lactic acid has improved and WBC is normal at 8.0 -IV fluids with normal saline - Empiric antibiotics for possible meningitis though no meningismus on exam  Lung cancer with cerebellar metastases s/p craniotomy 08/02/20 at  [facility] -Stable findings on chest x-ray and MRI - MRI brain showing small acute to subacute ischemic infarct along the margin of the  resection cavity, small 2 to 3 cm subdural hematoma Ephriam Jenkins over left occipital condyle fixity and postoperative changes from recent right suboccipital craniotomy with no appreciable residual or locally recurrent tumor.  Improvement in previous vasogenic edema. - Chest x-ray:Right middle lobe mass is unchanged. No developing consolidation or edema  Possible Seizure (HCC) - Ativan as needed seizure - EEG - Neurology consult and Dr. 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  Type II diabetes mellitus with renal manifestations (HCC) - Sliding scale insulin coverage   We will continue to monitor the patient's clinical response to intervention and repeat blood work and imaging and follow-up on specialist recommendations.  She is to undergo an MRI of the spine, an LP to be done under fluoroscopy as well as lower extremity Dopplers.

## 2020-08-20 NOTE — Consult Note (Addendum)
NEUROLOGY CONSULTATION NOTE   Date of service: August 20, 2020 Patient Name: Cheyenne Anthony MRN:  [id number] DOB:  Jul 18, 1946 Reason for consult: "Encephalopathy s/p recent craniotomy and resection of cerebellar met" _ _ _   _ __   _ __ _ _  __ __   _ __   __ _  History of Present Illness  Cheyenne Anthony is a 75 y.o. 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 She was brought in by family with encephalopathy, fever.  Workup in the ED with MRI Brain with and without contrast with post surgical changes, improved vasogenic edema, chronic R MCA infarct/encephalomalacia and a punctate focus of possible enhancement in the posteroir left frontotemporal region(tiny mets vs focal vascular enhancement). Labs with elevated lactate at presentation to 2.5. CBC and chemistry with no significant abnormalities. UA not concerning for a UTI. CXR with right middle lobe mass is unchanged. No developing consolidation or Edema. COVID PCR negative.  Of note, she recently finished steroids taper and is not on any steroids since 08/15/20.  I spoke to patient and his daughter. Patient is encephalopathic and only provided about 10% of the history and that too was unreliable, most of the history obtained from daughter.  Per daughter, patient has developed weakness in [place] lower extremities starting around christmas of 2021. She used to be on a walker before that. This was initially attribute to knee pain and osteoarthritis by her PCP. She continues to be weak. She is bed ridden for the last 4 weeks. She also endorses some increased urinary frequency and would pass urine in her depends overnight without realizing it. No constipation. She reports that patient was discharged to SNF after surgery at [facility]. Daughter has been visiting about twice a day. She  usually is awake and talkative. She noted that yesterday, her mom seemed very lethargic at the SNF so she called EMT and brought her to the hospital. She has been running hot at home. No episodes witnessed by daughter concerning for a seizure. No episode that the facility staff informed her off that are concerning for a seizure. Daughter reports that they are aware of her prior R MCA stroke but she never had any symptoms and they were surprised when they were told that the pictures showed a R sided stroke. No significant head injury with loss of consciousness, no episodes of starring off. No prior personal hx of seizures. No family hx of seizures.   ROS   Constitutional Denies weight loss, endorses fever and chills.  HEENT Denies changes in vision and hearing.  Respiratory Denies SOB and cough.  CV Denies palpitations and CP  GI Denies abdominal pain, nausea, vomiting and diarrhea.  GU Denies dysuria but endorses urinary frequency.  MSK endorses myalgia but not joint pain.  Skin Denies rash and pruritus.  Neurological Denies headache and syncope.  Psychiatric Denies recent changes in mood. Denies anxiety and depression.   Past History   Past Medical History:  Diagnosis Date  . Brain mass 07/31/2020  . Cancer ([place])   . Hypertension   . Right lower lobe lung mass   . Type II diabetes mellitus with renal manifestations ([place])    History reviewed. No pertinent surgical history. Family History  Problem Relation Age of Onset  . Stroke Brother    Social History  Socioeconomic History  . Marital status: Unknown    Spouse name: Not on file  . Number of children: Not on file  . Years of education: Not on file  . Highest education level: Not on file  Occupational History  . Not on file  Tobacco Use  . Smoking status: Former Smoker    Types: Cigarettes    Quit date: 07/31/2020    Years since quitting: 0.0  . Smokeless tobacco: Never Used  Substance and Sexual Activity  . Alcohol use:  Not Currently    Comment: occ  . Drug use: Never  . Sexual activity: Not on file  Other Topics Concern  . Not on file  Social History Narrative  . Not on file   Social Determinants of Health   Financial Resource Strain: Not on file  Food Insecurity: Not on file  Transportation Needs: Not on file  Physical Activity: Not on file  Stress: Not on file  Social Connections: Not on file   No Known Allergies  Medications  (Not in a hospital admission)    Vitals   Vitals:   08/19/20 1905 08/19/20 2344 08/20/20 0335 08/20/20 0641  BP: 126/74 131/79 118/77 128/75  Pulse: (!) 107 (!) 105 90 99  Resp: _0 Temp:  (!) 100.6 F (38.1 C)    TempSrc:  Rectal    SpO2: 97% 96% 98% 97%  Weight:      Height:         Body mass index is 30.04 kg/m.  Physical Exam   General: Laying comfortably in bed; in no acute distress. HENT: Normal oropharynx and mucosa. Normal external appearance of ears and nose. No teeth Neck: Supple, no pain or tenderness CV: No JVD. No peripheral edema. Pulmonary: Symmetric Chest rise. Normal respiratory effort. Abdomen: Soft to touch, non-tender. Ext: No cyanosis, mild BL pedal edema, no deformity Skin: No rash. Tenderness to palpation of BL lower extremities. Musculoskeletal: Normal digits and nails by inspection. No clubbing.  Neurologic Examination  Mental status/Cognition: opens eyes to voice, engages in a conversation with delayed responses,  oriented to self, but not to place, month and year, poor attention. Unable to do calculations. Speech/language: Fluent, comprehension intact, object naming intact, repetition intact. Cranial nerves:   CN II Pupils equal and reactive to light, no VF deficits   CN III,IV,VI EOM intact, no gaze preference or deviation, no nystagmus, ptosis BL with upper eyelid encroaching on pupils BL.   CN V normal sensation in V1, V2, and V3 segments bilaterally   CN VII no asymmetry, no nasolabial fold flattening   CN  VIII normal hearing to speech   CN IX & X normal palatal elevation, no uvular deviation   CN XI 5/5 head turn and 5/5 shoulder shrug bilaterally   CN XII midline tongue protrusion   Motor:  Muscle bulk: normal, tone normal, pronator drift none tremor yes, significant RUE ataxia and past pointing with tremor. Mvmt Root Nerve  Muscle Right Left Comments  SA C5/6 Ax Deltoid 4 4   EF C5/6 Mc Biceps 4+ 4+   EE C6/7/8 Rad Triceps 4+ 4+   WF C6/7 Med FCR 4+ 4+   WE C7/8 PIN ECU 4+ 4+   F Ab C8/T1 U ADM/FDI 4+ 4+   HF L1/2/3 Fem Illopsoas 2 2   KE L2/3/4 Fem Quad - -   DF L4/5 D Peron Tib Ant 2 2   PF S1/2 Tibial Grc/Sol 2 2  Reflexes:  Right Left Comments  Pectoralis      Biceps (C5/6) 2 2   Brachioradialis (C5/6) 2 2    Triceps (C6/7) 1 1    Patellar (L3/4) 3 3 Cross adductors +   Achilles (S1) 4 3 Clonus in R ankle, spread in L ankle. (pain with attempting reflexes and therefore not repeated)   Hoffman      Plantar     Jaw jerk    Sensation:  Light touch Intact BL   Pin prick ?decreased in LLE only.   Temperature    Vibration   Proprioception    Coordination/Complex Motor:  - Finger to Nose with RUE ataxia, past pointing, intact in LUE - Heel to shin unable to do due to significant weakness. - Rapid alternating movement are slowed - Gait: Unable to assess, she has been in a wheelchair for the last 4 weeks.  Labs   CBC:  Recent Labs  Lab 08/19/20 1255  WBC 10.3  HGB 10.8*  HCT 33.2*  MCV 86.7  PLT 540    Basic Metabolic Panel:  Lab Results  Component Value Date   NA 138 08/19/2020   K 2.8 (L) 08/19/2020   CO2 24 08/19/2020   GLUCOSE 218 (H) 08/19/2020   BUN 28 (H) 08/19/2020   CREATININE 0.68 08/19/2020   CALCIUM 9.3 08/19/2020   GFRNONAA >60 08/19/2020   Lipid Panel:  Lab Results  Component Value Date   LDLCALC 51 08/01/2020   HgbA1c:  Lab Results  Component Value Date   HGBA1C 8.4 (H) 08/01/2020   Urine Drug Screen: No results found for:  LABOPIA, COCAINSCRNUR, LABBENZ, AMPHETMU, THCU, LABBARB  Alcohol Level No results found for: [facility]  MRI Brain with and without contrast: Post surgical changes, improved vasogenic edema, chronic R MCA infarct/encephalomalacia and a punctate focus of possible enhancement in the posteroir left frontotemporal region(tiny mets vs focal vascular enhancement).  rEEG: pending  Impression   Cheyenne Anthony is a 75 y.o. female with lung cancer and recent resection of R cerebellum met who presents with encephalopathy x 1-2 days with fever, tachycardia, elevated lactate and 4-5 week hx of BL lower extremity weakness with increased urinary frequency. Neuro exam with somnolence, encephalopathic with BL lid ptosis, significant BL lower extremity weakness(out of proportion to upper ext weakness and encephalopathy) and BL lower extremity hyperreflexia and tenderness to palpation. Differential for her encephalopathy is broad. Primary concern is a potential CNS infection given fever, encephalopathy, elevated and tachycardia with no signs of pneumonia on CXR and no signs of UTI on UA. Seizure with post ictal state can also cause some of the above, however, no witnessed seizure activity, no prior hx of seizures, she does however have very significant myalgias in BL lower extremities which can be seen post itcal.   As for her BL lower ext weakness which is out of proportion to her encephalopathyand definitely out of proportion to her upper extremity strength, I am worried about a potential spinal mets particularly with the noted BL lower extremity hyperreflexia and urinary urgency and frequency. Another consideration if Rita Ohara Myasthenia, lower on differential but she does have BL upper eyelid ptosis(?may just befrom encephalopathy) along with weakness.  Recommendations  - I ordered CSF cell count and differential x 2, CSF lactate, CSF protein, glucose, CSF gram stain and  CSF cultures. Agree with coverage with  Meningitis dose Ceftriaxone and Vancomycin.(Neurosurgery team okay with pursuing LP) - I ordered MRSA screen. - I ordered MRI C/T/L  spine with and without contrast to evaluate for spinal mets. - I ordered BL lower ext venous duplex to rule out DVT given calf tenderness. - I ordered a routine EEG - to evaluate for epileptogenic abnormalities - Recommend loading with Keppra 63m/Kg for any seizure activity. - I ordered CSF and serum Paraneoplastic panel - to evaluate for VGCC antibodies concerning for LEMS. - She might benefit from an outpatient EMG with NCS with rep stimulation if above is negative. - Recommend routinely starting her on Aspirin 840mdaily when okay with Neurosurgery team for noted remote R MCA stroke with follow up outpatient with stroke neurologist for outpatient workup. ______________________________________________________________________   Thank you for the opportunity to take part in the care of this patient. If you have any further questions, please contact the neurology consultation attending.  Signed,  SaBancroftager Number [phone] _ _   _ __   _ __ _ _  __ __   _ __   __ _

## 2020-08-21 ENCOUNTER — Inpatient Hospital Stay: Payer: Medicare (Managed Care)

## 2020-08-21 ENCOUNTER — Encounter: Payer: Self-pay | Admitting: Internal Medicine

## 2020-08-21 DIAGNOSIS — R7989 Other specified abnormal findings of blood chemistry: Secondary | ICD-10-CM | POA: Diagnosis present

## 2020-08-21 DIAGNOSIS — G9341 Metabolic encephalopathy: Secondary | ICD-10-CM | POA: Diagnosis not present

## 2020-08-21 DIAGNOSIS — B999 Unspecified infectious disease: Secondary | ICD-10-CM | POA: Diagnosis not present

## 2020-08-21 DIAGNOSIS — C3490 Malignant neoplasm of unspecified part of unspecified bronchus or lung: Secondary | ICD-10-CM | POA: Diagnosis not present

## 2020-08-21 DIAGNOSIS — G9689 Other specified disorders of central nervous system: Secondary | ICD-10-CM | POA: Diagnosis not present

## 2020-08-21 DIAGNOSIS — E669 Obesity, unspecified: Secondary | ICD-10-CM

## 2020-08-21 DIAGNOSIS — H409 Unspecified glaucoma: Secondary | ICD-10-CM | POA: Diagnosis present

## 2020-08-21 DIAGNOSIS — E2740 Unspecified adrenocortical insufficiency: Secondary | ICD-10-CM

## 2020-08-21 LAB — URINE CULTURE: Culture: NO GROWTH

## 2020-08-21 LAB — CSF CELL COUNT WITH DIFFERENTIAL
Eosinophils, CSF: 0 %
Eosinophils, CSF: 0 %
Lymphs, CSF: 85 %
Lymphs, CSF: 89 %
Monocyte-Macrophage-Spinal Fluid: 5 %
Monocyte-Macrophage-Spinal Fluid: 7 %
RBC Count, CSF: 346 /mm3 — ABNORMAL HIGH (ref 0–3)
RBC Count, CSF: 567 /mm3 — ABNORMAL HIGH (ref 0–3)
Segmented Neutrophils-CSF: 6 %
Segmented Neutrophils-CSF: 8 %
Tube #: 1
Tube #: 4
WBC, CSF: 27 /mm3 (ref 0–5)
WBC, CSF: 39 /mm3 (ref 0–5)

## 2020-08-21 LAB — CBC
HCT: 31 % — ABNORMAL LOW (ref 36.0–46.0)
Hemoglobin: 10.2 g/dL — ABNORMAL LOW (ref 12.0–15.0)
MCH: 28.2 pg (ref 26.0–34.0)
MCHC: 32.9 g/dL (ref 30.0–36.0)
MCV: 85.6 fL (ref 80.0–100.0)
Platelets: 288 10*3/uL (ref 150–400)
RBC: 3.62 MIL/uL — ABNORMAL LOW (ref 3.87–5.11)
RDW: 15.4 % (ref 11.5–15.5)
WBC: 13.2 10*3/uL — ABNORMAL HIGH (ref 4.0–10.5)
nRBC: 0 % (ref 0.0–0.2)

## 2020-08-21 LAB — BASIC METABOLIC PANEL
Anion gap: 12 (ref 5–15)
BUN: 18 mg/dL (ref 8–23)
CO2: 22 mmol/L (ref 22–32)
Calcium: 8.8 mg/dL — ABNORMAL LOW (ref 8.9–10.3)
Chloride: 106 mmol/L (ref 98–111)
Creatinine, Ser: 0.54 mg/dL (ref 0.44–1.00)
GFR, Estimated: >60 mL/min (ref >60)
Glucose, Bld: 213 mg/dL — ABNORMAL HIGH (ref 70–99)
Potassium: 3.3 mmol/L — ABNORMAL LOW (ref 3.5–5.1)
Sodium: 140 mmol/L (ref 135–145)

## 2020-08-21 LAB — PROCALCITONIN
Procalcitonin: 21.14 ng/mL
Procalcitonin: 21.59 ng/mL

## 2020-08-21 LAB — PROTIME-INR
INR: 1.1 (ref 0.8–1.2)
Prothrombin Time: 13.9 seconds (ref 11.4–15.2)

## 2020-08-21 LAB — MRSA PCR SCREENING: MRSA by PCR: NEGATIVE

## 2020-08-21 LAB — CBG MONITORING, ED
Glucose-Capillary: 243 mg/dL — ABNORMAL HIGH (ref 70–99)
Glucose-Capillary: 187 mg/dL — ABNORMAL HIGH (ref 70–99)
Glucose-Capillary: 184 mg/dL — ABNORMAL HIGH (ref 70–99)
Glucose-Capillary: 199 mg/dL — ABNORMAL HIGH (ref 70–99)
Glucose-Capillary: 299 mg/dL — ABNORMAL HIGH (ref 70–99)
Glucose-Capillary: 227 mg/dL — ABNORMAL HIGH (ref 70–99)

## 2020-08-21 LAB — GLUCOSE, CSF: Glucose, CSF: 121 mg/dL — ABNORMAL HIGH (ref 40–70)

## 2020-08-21 LAB — PROTEIN, CSF: Total  Protein, CSF: 63 mg/dL — ABNORMAL HIGH (ref 15–45)

## 2020-08-21 LAB — CORTISOL-AM, BLOOD: Cortisol - AM: 1.6 ug/dL — ABNORMAL LOW (ref 6.7–22.6)

## 2020-08-21 MED ORDER — SODIUM CHLORIDE 0.9 % IV SOLN
2.0000 g | Freq: Three times a day (TID) | INTRAVENOUS | Status: DC
  Filled 2020-08-21 (×2): qty 2

## 2020-08-21 MED ORDER — LATANOPROST 0.005 % OP SOLN
1.0000 [drp] | Freq: Every day | OPHTHALMIC | Status: DC
  Administered 2020-08-21 – 2020-08-24 (×4): 1 [drp] via OPHTHALMIC
  Filled 2020-08-21: qty 2.5

## 2020-08-21 MED ORDER — METOPROLOL SUCCINATE ER 50 MG PO TB24
50.0000 mg | ORAL_TABLET | Freq: Every day | ORAL | Status: DC
  Administered 2020-08-22 – 2020-08-25 (×4): 50 mg via ORAL
  Filled 2020-08-21 (×4): qty 1

## 2020-08-21 MED ORDER — INSULIN GLARGINE 100 UNIT/ML ~~LOC~~ SOLN
5.0000 [IU] | Freq: Every day | SUBCUTANEOUS | Status: DC
  Administered 2020-08-21 – 2020-08-24 (×4): 5 [IU] via SUBCUTANEOUS
  Filled 2020-08-21 (×5): qty 0.05

## 2020-08-21 MED ORDER — SODIUM CHLORIDE 0.9 % IV SOLN
2.0000 g | Freq: Three times a day (TID) | INTRAVENOUS | Status: AC
  Administered 2020-08-21: 2 g via INTRAVENOUS
  Filled 2020-08-21 (×2): qty 2

## 2020-08-21 MED ORDER — HYDROCORTISONE 5 MG PO TABS
10.0000 mg | ORAL_TABLET | Freq: Every day | ORAL | Status: DC
  Administered 2020-08-22 – 2020-08-25 (×4): 10 mg via ORAL
  Filled 2020-08-21: qty 2
  Filled 2020-08-21: qty 1
  Filled 2020-08-21 (×2): qty 2
  Filled 2020-08-21: qty 1
  Filled 2020-08-21: qty 2

## 2020-08-21 MED ORDER — HYDROCORTISONE 5 MG PO TABS
5.0000 mg | ORAL_TABLET | ORAL | Status: AC
  Administered 2020-08-21: 5 mg via ORAL
  Filled 2020-08-21: qty 1

## 2020-08-21 MED ORDER — DULOXETINE HCL 60 MG PO CPEP
60.0000 mg | ORAL_CAPSULE | Freq: Every day | ORAL | Status: DC
  Administered 2020-08-21 – 2020-08-25 (×5): 60 mg via ORAL
  Filled 2020-08-21 (×5): qty 1

## 2020-08-21 MED ORDER — HYDROCORTISONE 5 MG PO TABS
5.0000 mg | ORAL_TABLET | Freq: Two times a day (BID) | ORAL | Status: DC
  Administered 2020-08-21: 5 mg via ORAL
  Filled 2020-08-21 (×3): qty 1

## 2020-08-21 MED ORDER — LIDOCAINE HCL (PF) 1 % IJ SOLN
5.0000 mL | Freq: Once | INTRAMUSCULAR | Status: AC
  Administered 2020-08-21: 5 mL

## 2020-08-21 NOTE — Procedures (Signed)
Patient Name: Cheyenne Anthony  MRN: [id number]  Epilepsy Attending: Lora Havens  Referring Physician/Provider: Dr Donnetta Simpers Date: 08/21/2020 Duration: 20.59 mins  Patient history: 74yo F with ams. EEG to evaluate for seizure  Level of alertness: Awake  AEDs during EEG study: None  Technical aspects: This EEG study was done with scalp electrodes positioned according to the 10-20 International system of electrode placement. Electrical activity was acquired at a sampling rate of 500Hz  and reviewed with a high frequency filter of 70Hz  and a low frequency filter of 1Hz . EEG data were recorded continuously and digitally stored.   Description: The posterior dominant rhythm consists of 8-9 Hz activity of moderate voltage (25-35 uV) seen predominantly in posterior head regions, symmetric and reactive to eye opening and eye closing. Hyperventilation and photic stimulation were not performed.     IMPRESSION: This study is within normal limits. No seizures or epileptiform discharges were seen throughout the recording.  Cheyenne Anthony Cheyenne Anthony

## 2020-08-21 NOTE — Progress Notes (Addendum)
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  In the emergency room, noted to have a rectal temperature of 100.6 and MRI noted a small acute to subacute ischemic infarct along margins of resection cavity with small 2 to 3 cm subdural hematoma over the left occipital condyle with no appreciable residual or local recurrent tumor.  Neurosurgery consulted who felt that findings appear to be more in line with patient's recent surgical procedure and recommended neurology consult and EEG.  Admitted to the hospitalist service.  Patient noted to have elevated lactic acid level on admission so started on broad-spectrum antibiotics as concerns for meningitis.  Following admission, patient much more alert and interactive, looking to be back to baseline by day 2.  Seen by neurology and after cleared by neurosurgery, LP done on 1/24 by fluoroscopy.  EEG ordered and is pending.  Preliminary results of CSF note very high white count and infectious disease consulted.  With only complains of being hungry and hoping to get a more comfortable bed.  Denies any headache and does not really remember being confused.  With patient more alert, evaluated by neurology and found to have more lower extremity weakness  Assessment/Plan: Principal Problem:   Acute metabolic encephalopathy with abnormal CT of head findings and patient with history of metastatic lung cancer with cerebellar metastases status post craniotomy: Unclear etiology.  Differential diagnosis includes seizure versus infection versus adrenal crisis (see  below).  EEG pending.  Final results of CSF from LP are pending.  Appreciate neurology, neurosurgery and infectious disease help.  Broad-spectrum antibiotics continued for now.  Noted markedly elevated procalcitonin level at 24 today, after several days of antibiotics.  Steroids added given low cortisol level.  Patient looks to be much improved.  Given unclear etiologies of infection, at this time, does not meet criteria for sepsis and sepsis is ruled out  Lower extremity weakness: Now the patient is more alert, exam more revealing.  Could be deconditioning and/or existing degenerative process and/or paraneoplastic syndrome and/or something such as Lambert-Eaton myasthenic syndrome.  Neurology following.  Outpatient evaluation with EMG recommended as well  Active Problems:   Hypokalemia: Replaced    Hypertension: Blood pressure and heart rate trending upward so have restarted patient's Lipitor.    Type II diabetes mellitus with renal manifestations ([place]): On sliding scale.  With addition of hydrocortisone, will need better insulin coverage, will add low-dose Lantus    Depression: Restart Cymbalta    Glaucoma: Continue eyedrops.  Low cortisol: Morning cortisol level at 1.6.  If possible, this could be adrenal crisis causing her issues.  Discussed with infectious disease.  Have started hydrocortisone.    Obesity (BMI 30-39.9): Meets criteria BMI greater than 30  Lactic acidosis: Patient on Metformin combination drug which may account for this.  I have also been confused and having poor p.o. intake.   Code Status: Full code  Family Communication: Left message with daughter  Disposition Plan: Work-up continuing.  Anticipate will be here for several days   Consultants:  Neurology  Neurosurgery  Infectious disease  Procedures:  EEG: Results pending  Lumbar puncture done under fluoroscopy:  Final results pending  Antimicrobials:  IV cefepime and vancomycin 1/22-present  DVT  prophylaxis: SCDs  Level of care: Progressive Cardiac   Objective: Vitals:   08/21/20 1140 08/21/20 1215  BP: 123/89 (!) 152/87  Pulse: 100 (!) 105  Resp: 20 16  Temp: 97.9 F (36.6 C) 98.1 F (36.7 C)  SpO2: 96% 95%    Intake/Output Summary (Last 24 hours) at 08/21/2020 1623 Last data filed at 08/21/2020 1455 Gross per 24 hour  Intake 1340.13 ml  Output --  Net 1340.13 ml   Filed Weights   08/19/20 1237  Weight: 79.4 kg   Body mass index is 30.04 kg/m.  Exam:   General: Alert and oriented x2, no acute distress  HEENT: Normocephalic and atraumatic, mucous membranes slightly dry  Cardiovascular: Regular rate and rhythm, S1-S2  Respiratory: Clear to auscultation bilaterally  Abdomen: Soft, nontender, nondistended, positive bowel sounds  Musculoskeletal: No clubbing or cyanosis or edema  Skin: No skin breaks, tears or lesions  Psychiatry: Patient is appropriate and interactive, no evidence of psychoses or confusion   Data Reviewed: CBC: Recent Labs  Lab 08/19/20 1255 08/20/20 1014 08/21/20 0432  WBC 10.3 8.0 13.2*  NEUTROABS  --  6.7  --   HGB 10.8* 10.2* 10.2*  HCT 33.2* 31.7* 31.0*  MCV 86.7 87.1 85.6  PLT 345 292 974   Basic Metabolic Panel: Recent Labs  Lab 08/19/20 1255 08/20/20 1014 08/21/20 0432  NA 138 138 140  K 2.8* 3.6 3.3*  CL 99 103 106  CO2 24 23 22   GLUCOSE 218* 267* 213*  BUN 28* 18 18  CREATININE 0.68 0.53 0.54  CALCIUM 9.3 8.8* 8.8*  MG 1.8 1.7  --   PHOS  --  2.3*  --    GFR: Estimated Creatinine Clearance: 63.9 mL/min (by C-G formula based on SCr of 0.54 mg/dL). Liver Function Tests: Recent Labs  Lab 08/19/20 1255 08/20/20 1014  AST 19 16  ALT 11 12  ALKPHOS 56 56  BILITOT 1.2 0.9  PROT 6.9 6.7  ALBUMIN 3.2* 3.1*   No results for input(s): LIPASE, AMYLASE in the last 168 hours. No results for input(s): AMMONIA in the last 168 hours. Coagulation Profile: Recent Labs  Lab 08/19/20 1255 08/21/20 0432   INR 1.0 1.1   Cardiac Enzymes: No results for input(s): CKTOTAL, CKMB, CKMBINDEX, TROPONINI in the last 168 hours. BNP (last 3 results) No results for input(s): PROBNP in the last 8760 hours. HbA1C: No results for input(s): HGBA1C in the last 72 hours. CBG: Recent Labs  Lab 08/20/20 1901 08/20/20 2349 08/21/20 0426 08/21/20 1013 08/21/20 1206  GLUCAP 244* 243* 187* 184* 199*   Lipid Profile: No results for input(s): CHOL, HDL, LDLCALC, TRIG, CHOLHDL, LDLDIRECT in the last 72 hours. Thyroid Function Tests: No results for input(s): TSH, T4TOTAL, FREET4, T3FREE, THYROIDAB in the last 72 hours. Anemia Panel: No results for input(s): VITAMINB12, FOLATE, FERRITIN, TIBC, IRON, RETICCTPCT in the last 72 hours. Urine analysis:    Component Value Date/Time   COLORURINE YELLOW (A) 08/19/2020 2346   APPEARANCEUR HAZY (A) 08/19/2020 2346   LABSPEC 1.025 08/19/2020 2346   PHURINE 5.0 08/19/2020 2346   GLUCOSEU NEGATIVE 08/19/2020 2346   HGBUR NEGATIVE 08/19/2020 2346   [place] 08/19/2020 2346   KETONESUR NEGATIVE 08/19/2020 2346   PROTEINUR NEGATIVE 08/19/2020 2346   NITRITE NEGATIVE 08/19/2020 2346   LEUKOCYTESUR TRACE (A) 08/19/2020 2346   Sepsis Labs: @LABRCNTIP (procalcitonin:4,lacticidven:4)  ) Recent Results (from the past 240  hour(s))  Urine Culture     Status: None   Collection Time: 08/19/20 11:46 PM   Specimen: Urine, Random  Result Value Ref Range Status   Specimen Description   Final    URINE, RANDOM Performed at [facility], [address], [place], [place] [address]    Special Requests   Final    NONE Performed at [facility], [address], [place], [place] [address]    Culture   Final    NO GROWTH Performed at [facility], [place] [address], [place], [place] [address]    Report Status 08/21/2020 FINAL  Final  SARS Coronavirus 2 by RT PCR (hospital order, performed in [facility] hospital lab) Nasopharyngeal  Nasopharyngeal Swab     Status: None   Collection Time: 08/20/20 12:39 AM   Specimen: Nasopharyngeal Swab  Result Value Ref Range Status   SARS Coronavirus 2 NEGATIVE NEGATIVE Final    Comment: (NOTE) SARS-CoV-2 target nucleic acids are NOT DETECTED.  The SARS-CoV-2 RNA is generally detectable in upper and lower respiratory specimens during the acute phase of infection. The lowest concentration of SARS-CoV-2 viral copies this assay can detect is 250 copies / mL. A negative result does not preclude SARS-CoV-2 infection and should not be used as the sole basis for treatment or other patient management decisions.  A negative result may occur with improper specimen collection / handling, submission of specimen other than nasopharyngeal swab, presence of viral mutation(s) within the areas targeted by this assay, and inadequate number of viral copies (<250 copies / mL). A negative result must be combined with clinical observations, patient history, and epidemiological information.  Fact Sheet for Patients:   StrictlyIdeas.no  Fact Sheet for Healthcare Providers: BankingDealers.co.za  This test is not yet approved or  cleared by the [place] FDA and has been authorized for detection and/or diagnosis of SARS-CoV-2 by FDA under an Emergency Use Authorization (EUA).  This EUA will remain in effect (meaning this test can be used) for the duration of the COVID-19 declaration under Section 564(b)(1) of the Act, 21 U.S.C. section 360bbb-3(b)(1), unless the authorization is terminated or revoked sooner.  Performed at [facility], [address] [place], [place] [address]   Culture, blood (Routine X 2) w Reflex to ID Panel     Status: None (Preliminary result)   Collection Time: 08/20/20 12:39 AM   Specimen: BLOOD  Result Value Ref Range Status   Specimen Description BLOOD BLOOD RIGHT HAND  Final   Special Requests   Final     BOTTLES DRAWN AEROBIC AND ANAEROBIC Blood Culture results may not be optimal due to an excessive volume of blood received in culture bottles   Culture   Final    NO GROWTH 1 DAY Performed at [facility], [address] [place], [place] [address]    Report Status PENDING  Incomplete  Culture, blood (Routine X 2) w Reflex to ID Panel     Status: None (Preliminary result)   Collection Time: 08/20/20 12:39 AM   Specimen: BLOOD  Result Value Ref Range Status   Specimen Description BLOOD BLOOD LEFT WRIST  Final   Special Requests   Final    BOTTLES DRAWN AEROBIC AND ANAEROBIC Blood Culture results may not be optimal due to an excessive volume of blood received in culture bottles   Culture   Final    NO GROWTH 1 DAY Performed at [facility], [place],  [place], [place] [address]    Report Status PENDING  Incomplete  MRSA PCR Screening     Status: None   Collection Time: 08/20/20 12:43 PM   Specimen: Nasopharyngeal  Result Value Ref Range Status   MRSA by PCR NEGATIVE NEGATIVE Final    Comment:        The GeneXpert MRSA Assay (FDA approved for NASAL specimens only), is one component of a comprehensive MRSA colonization surveillance program. It is not intended to diagnose MRSA infection nor to guide or monitor treatment for MRSA infections. Performed at [facility], [address] [place], [place] [address]   CSF culture     Status: None (Preliminary result)   Collection Time: 08/21/20  8:43 AM   Specimen: PATH Cytology CSF; Cerebrospinal Fluid  Result Value Ref Range Status   Specimen Description CSF  Final   Special Requests NONE  Final   Gram Stain   Final    NO ORGANISMS SEEN WBC SEEN RBC SEEN Performed at [facility], [address] [place],  [address]    Culture PENDING  Incomplete   Report Status PENDING  Incomplete      Studies: DG FLUORO GUIDED LOC OF NEEDLE/CATH TIP FOR SPINAL INJECT LT  Result Date:  08/21/2020 CLINICAL DATA:  Encephalopathy EXAM: DIAGNOSTIC LUMBAR PUNCTURE UNDER FLUOROSCOPIC GUIDANCE FLUOROSCOPY TIME:  Fluoroscopy Time:  0.2 minute Radiation Exposure Index (if provided by the fluoroscopic device): 1.1 mGy Number of Acquired Spot Images: 0 PROCEDURE: Informed consent was obtained from the patient prior to the procedure, including potential complications of headache, allergy, and pain. With the patient prone, the lower back was prepped with Betadine. 1% Lidocaine was used for local anesthesia. Lumbar puncture was performed at the L2-3 level using a 22 gauge needle with return of blood tinged CSF with progressive clearing 2 clear CSF. 8 ml of CSF were obtained for laboratory studies. The patient tolerated the procedure well and there were no apparent complications. IMPRESSION: Successful fluoroscopic guided lumbar puncture. Electronically Signed   By: Kathreen Devoid   On: 08/21/2020 09:18    Scheduled Meds: . Derrill Memo ON 08/22/2020] hydrocortisone  10 mg Oral Daily  . hydrocortisone  5 mg Oral BID  . insulin aspart  0-15 Units Subcutaneous Q4H    Continuous Infusions: . sodium chloride 75 mL/hr (08/21/20 0448)  . sodium chloride Stopped (08/20/20 1942)  . ceFEPime (MAXIPIME) IV Stopped (08/21/20 1455)  . [START ON 08/22/2020] cefTAZidime (FORTAZ)  IV    . vancomycin 1,000 mg (08/21/20 1456)     LOS: 1 day     Annita Brod, MD [facility]   08/21/2020, 4:23 PM

## 2020-08-21 NOTE — Consult Note (Signed)
Neurosurgery-New Consultation Evaluation 08/21/2020 Cheyenne Anthony [id number]  Identifying Statement: Cheyenne Anthony is a 75 y.o. female from [place] [id number] with recent craniotomy for brain tumor resection  Physician Requesting Consultation: Judd Gaudier, MD  History of Present Illness: Ms. Cheyenne Anthony is admitted through the emergency department for concern of recent episodes of confusion and staring spells.  She underwent a craniotomy in the last couple weeks which appears to be uncomplicated but she did have some episodes similar to this while in the hospital.  She is slightly confused in the emergency department but is able to give history.  She states she does note that she has the staring spells but her daughter has noted them more.  She did have a mild fever.  MRI of the brain was unremarkable but given concern for possible infection, she was started on antibiotics.  She is currently denying any speech difficulty, weakness, numbness.  She denies any obvious seizure activity.  She denies any drainage from her incision.  Past Medical History:  Past Medical History:  Diagnosis Date  . Brain mass 07/31/2020  . Cancer ([place])   . Hypertension   . Right lower lobe lung mass   . Type II diabetes mellitus with renal manifestations [facility])     Social History: Social History   Socioeconomic History  . Marital status: Unknown    Spouse name: Not on file  . Number of children: Not on file  . Years of education: Not on file  . Highest education level: Not on file  Occupational History  . Not on file  Tobacco Use  . Smoking status: Former Smoker    Types: Cigarettes    Quit date: 07/31/2020    Years since quitting: 0.0  . Smokeless tobacco: Never Used  Substance and Sexual Activity  . Alcohol use: Not Currently    Comment: occ  . Drug use: Never  . Sexual activity: Not on file  Other Topics Concern  . Not on file  Social History Narrative  . Not on file   Social Determinants of Health    Financial Resource Strain: Not on file  Food Insecurity: Not on file  Transportation Needs: Not on file  Physical Activity: Not on file  Stress: Not on file  Social Connections: Not on file  Intimate Partner Violence: Not on file    Family History: Family History  Problem Relation Age of Onset  . Stroke Brother     Review of Systems:  Review of Systems - General ROS: Negative Psychological ROS: Negative Ophthalmic ROS: Negative ENT ROS: Negative Hematological and Lymphatic ROS: Negative  Endocrine ROS: Negative Respiratory ROS: Negative Cardiovascular ROS: Negative Gastrointestinal ROS: Negative Genito-Urinary ROS: Negative Musculoskeletal ROS: Positive for weakness Neurological ROS: Positive for episodes of confusion Dermatological ROS: Negative  Physical Exam: BP (!) 152/87 (BP Location: Left Arm)   Pulse (!) 105   Temp 98.1 F (36.7 C) (Oral)   Resp 16   Ht 5\' 4"  (1.626 m)   Wt 79.4 kg   SpO2 95%   BMI 30.04 kg/m  Body mass index is 30.04 kg/m. Body surface area is 1.89 meters squared. General appearance: Alert, cooperative, in no acute distress Head: Normocephalic, right posterior fossa incision appears well approximated and flat Eyes: Normal, EOM intact Oropharynx: Moist without lesions Neck: Supple, range of motion appears full Ext: No edema in LE bilaterally, warm extremities  Neurologic exam:  Mental status: alertness: alert, orientation: person, place, time, affect: normal Speech: fluent and clear, naming  and repetition are intact Cranial nerves:  II: Visual fields are full by confrontation, no ptosis III/IV/VI: extra-ocular motions intact bilaterally V/VII:no evidence of facial droop or weakness  VIII: hearing normal XI: trapezius strength symmetric,  sternocleidomastoid strength symmetric XII: tongue strength symmetric  Motor:strength symmetric 5/5, normal muscle mass and tone in all extremities  Sensory: intact to light touch in all  extremities Gait: Not tested  Laboratory: Results for orders placed or performed during the hospital encounter of 08/19/20  Urine Culture   Specimen: Urine, Random  Result Value Ref Range   Specimen Description      URINE, RANDOM Performed at [facility], [address], [place], [place] [address]    Special Requests      NONE Performed at [facility], [address], [place], [place] [address]    Culture      NO GROWTH Performed at [facility], [place] [address] [place], [place] [address]    Report Status 08/21/2020 FINAL   SARS Coronavirus 2 by RT PCR (hospital order, performed in [place] hospital lab) Nasopharyngeal Nasopharyngeal Swab   Specimen: Nasopharyngeal Swab  Result Value Ref Range   SARS Coronavirus 2 NEGATIVE NEGATIVE  Culture, blood (Routine X 2) w Reflex to ID Panel   Specimen: BLOOD  Result Value Ref Range   Specimen Description BLOOD BLOOD RIGHT HAND    Special Requests      BOTTLES DRAWN AEROBIC AND ANAEROBIC Blood Culture results may not be optimal due to an excessive volume of blood received in culture bottles   Culture      NO GROWTH 1 DAY Performed at [facility], [address], [place], [place] [address]    Report Status PENDING   Culture, blood (Routine X 2) w Reflex to ID Panel   Specimen: BLOOD  Result Value Ref Range   Specimen Description BLOOD BLOOD LEFT WRIST    Special Requests      BOTTLES DRAWN AEROBIC AND ANAEROBIC Blood Culture results may not be optimal due to an excessive volume of blood received in culture bottles   Culture      NO GROWTH 1 DAY Performed at [facility], [address] [place], [place] [address]    Report Status PENDING   MRSA PCR Screening   Specimen: Nasopharyngeal  Result Value Ref Range   MRSA by PCR NEGATIVE NEGATIVE  CSF culture   Specimen: PATH Cytology CSF; Cerebrospinal Fluid  Result Value Ref Range   Specimen Description CSF    Special Requests  NONE    Gram Stain      NO ORGANISMS SEEN WBC SEEN RBC SEEN Performed at [facility], [address] [place], [place] [address]    Culture PENDING    Report Status PENDING   Comprehensive metabolic panel  Result Value Ref Range   Sodium 138 135 - 145 mmol/L   Potassium 2.8 (L) 3.5 - 5.1 mmol/L   Chloride 99 98 - 111 mmol/L   CO2 24 22 - 32 mmol/L   Glucose, Bld 218 (H) 70 - 99 mg/dL   BUN 28 (H) 8 - 23 mg/dL   Creatinine, Ser 0.68 0.44 - 1.00 mg/dL   Calcium 9.3 8.9 - 10.3 mg/dL   Total Protein 6.9 6.5 - 8.1 g/dL   Albumin 3.2 (L) 3.5 - 5.0 g/dL   AST 19 15 - 41 U/L   ALT 11 0 - 44 U/L   Alkaline Phosphatase 56 38 -  126 U/L   Total Bilirubin 1.2 0.3 - 1.2 mg/dL   GFR, Estimated >60 >60 mL/min   Anion gap 15 5 - 15  CBC  Result Value Ref Range   WBC 10.3 4.0 - 10.5 K/uL   RBC 3.83 (L) 3.87 - 5.11 MIL/uL   Hemoglobin 10.8 (L) 12.0 - 15.0 g/dL   HCT 33.2 (L) 36.0 - 46.0 %   MCV 86.7 80.0 - 100.0 fL   MCH 28.2 26.0 - 34.0 pg   MCHC 32.5 30.0 - 36.0 g/dL   RDW 15.4 11.5 - 15.5 %   Platelets 345 150 - 400 K/uL   nRBC 0.0 0.0 - 0.2 %  Protime-INR - (order if patient is taking Coumadin / Warfarin)  Result Value Ref Range   Prothrombin Time 13.1 11.4 - 15.2 seconds   INR 1.0 0.8 - 1.2  Urinalysis, Complete w Microscopic Urine, Catheterized  Result Value Ref Range   Color, Urine YELLOW (A) YELLOW   APPearance HAZY (A) CLEAR   Specific Gravity, Urine 1.025 1.005 - 1.030   pH 5.0 5.0 - 8.0   Glucose, UA NEGATIVE NEGATIVE mg/dL   Hgb urine dipstick NEGATIVE NEGATIVE   Bilirubin Urine NEGATIVE NEGATIVE   Ketones, ur NEGATIVE NEGATIVE mg/dL   Protein, ur NEGATIVE NEGATIVE mg/dL   Nitrite NEGATIVE NEGATIVE   Leukocytes,Ua TRACE (A) NEGATIVE   RBC / HPF 6-10 0 - 5 RBC/hpf   WBC, UA 6-10 0 - 5 WBC/hpf   Bacteria, UA NONE SEEN NONE SEEN   Squamous Epithelial / LPF 0-5 0 - 5   Mucus PRESENT    Budding Yeast PRESENT   Magnesium  Result Value Ref Range    Magnesium 1.8 1.7 - 2.4 mg/dL  Lactic acid, plasma  Result Value Ref Range   Lactic Acid, Venous 2.5 (HH) 0.5 - 1.9 mmol/L  CBC with Differential/Platelet  Result Value Ref Range   WBC 8.0 4.0 - 10.5 K/uL   RBC 3.64 (L) 3.87 - 5.11 MIL/uL   Hemoglobin 10.2 (L) 12.0 - 15.0 g/dL   HCT 31.7 (L) 36.0 - 46.0 %   MCV 87.1 80.0 - 100.0 fL   MCH 28.0 26.0 - 34.0 pg   MCHC 32.2 30.0 - 36.0 g/dL   RDW 15.3 11.5 - 15.5 %   Platelets 292 150 - 400 K/uL   nRBC 0.0 0.0 - 0.2 %   Neutrophils Relative % 85 %   Neutro Abs 6.7 1.7 - 7.7 K/uL   Lymphocytes Relative 12 %   Lymphs Abs 1.0 0.7 - 4.0 K/uL   Monocytes Relative 2 %   Monocytes Absolute 0.2 0.1 - 1.0 K/uL   Eosinophils Relative 0 %   Eosinophils Absolute 0.0 0.0 - 0.5 K/uL   Basophils Relative 0 %   Basophils Absolute 0.0 0.0 - 0.1 K/uL   Immature Granulocytes 1 %   Abs Immature Granulocytes 0.07 0.00 - 0.07 K/uL  Comprehensive metabolic panel  Result Value Ref Range   Sodium 138 135 - 145 mmol/L   Potassium 3.6 3.5 - 5.1 mmol/L   Chloride 103 98 - 111 mmol/L   CO2 23 22 - 32 mmol/L   Glucose, Bld 267 (H) 70 - 99 mg/dL   BUN 18 8 - 23 mg/dL   Creatinine, Ser 0.53 0.44 - 1.00 mg/dL   Calcium 8.8 (L) 8.9 - 10.3 mg/dL   Total Protein 6.7 6.5 - 8.1 g/dL   Albumin 3.1 (L) 3.5 - 5.0 g/dL   AST  16 15 - 41 U/L   ALT 12 0 - 44 U/L   Alkaline Phosphatase 56 38 - 126 U/L   Total Bilirubin 0.9 0.3 - 1.2 mg/dL   GFR, Estimated >60 >60 mL/min   Anion gap 12 5 - 15  Magnesium  Result Value Ref Range   Magnesium 1.7 1.7 - 2.4 mg/dL  Phosphorus  Result Value Ref Range   Phosphorus 2.3 (L) 2.5 - 4.6 mg/dL  Lactic acid, plasma  Result Value Ref Range   Lactic Acid, Venous 1.2 0.5 - 1.9 mmol/L  CSF cell count with differential collection tube #: 4  Result Value Ref Range   Tube # 4    Color, CSF COLORLESS COLORLESS   Appearance, CSF CLEAR (A) CLEAR   RBC Count, CSF 567 (H) 0 - 3 /cu mm   WBC, CSF 39 (HH) 0 - 5 /cu mm   Segmented  Neutrophils-CSF 8 %   Lymphs, CSF 85 %   Monocyte-Macrophage-Spinal Fluid 7 %   Eosinophils, CSF 0 %  Protime-INR  Result Value Ref Range   Prothrombin Time 13.9 11.4 - 15.2 seconds   INR 1.1 0.8 - 1.2  Procalcitonin  Result Value Ref Range   Procalcitonin 21.14 ng/mL  Basic metabolic panel  Result Value Ref Range   Sodium 140 135 - 145 mmol/L   Potassium 3.3 (L) 3.5 - 5.1 mmol/L   Chloride 106 98 - 111 mmol/L   CO2 22 22 - 32 mmol/L   Glucose, Bld 213 (H) 70 - 99 mg/dL   BUN 18 8 - 23 mg/dL   Creatinine, Ser 0.54 0.44 - 1.00 mg/dL   Calcium 8.8 (L) 8.9 - 10.3 mg/dL   GFR, Estimated >60 >60 mL/min   Anion gap 12 5 - 15  CBC  Result Value Ref Range   WBC 13.2 (H) 4.0 - 10.5 K/uL   RBC 3.62 (L) 3.87 - 5.11 MIL/uL   Hemoglobin 10.2 (L) 12.0 - 15.0 g/dL   HCT 31.0 (L) 36.0 - 46.0 %   MCV 85.6 80.0 - 100.0 fL   MCH 28.2 26.0 - 34.0 pg   MCHC 32.9 30.0 - 36.0 g/dL   RDW 15.4 11.5 - 15.5 %   Platelets 288 150 - 400 K/uL   nRBC 0.0 0.0 - 0.2 %  Glucose, CSF  Result Value Ref Range   Glucose, CSF 121 (H) 40 - 70 mg/dL  Protein, CSF  Result Value Ref Range   Total  Protein, CSF 63 (H) 15 - 45 mg/dL  CSF cell count with differential  Result Value Ref Range   Tube # 1    Color, CSF COLORLESS COLORLESS   Appearance, CSF CLEAR (A) CLEAR   RBC Count, CSF 346 (H) 0 - 3 /cu mm   WBC, CSF 27 (HH) 0 - 5 /cu mm   Segmented Neutrophils-CSF 6 %   Lymphs, CSF 89 %   Monocyte-Macrophage-Spinal Fluid 5 %   Eosinophils, CSF 0 %  CBG monitoring, ED  Result Value Ref Range   Glucose-Capillary 224 (H) 70 - 99 mg/dL  CBG monitoring, ED  Result Value Ref Range   Glucose-Capillary 326 (H) 70 - 99 mg/dL  CBG monitoring, ED  Result Value Ref Range   Glucose-Capillary 244 (H) 70 - 99 mg/dL  CBG monitoring, ED  Result Value Ref Range   Glucose-Capillary 243 (H) 70 - 99 mg/dL  CBG monitoring, ED  Result Value Ref Range   Glucose-Capillary 187 (H) 70 -  99 mg/dL  CBG monitoring, ED   Result Value Ref Range   Glucose-Capillary 184 (H) 70 - 99 mg/dL  CBG monitoring, ED  Result Value Ref Range   Glucose-Capillary 199 (H) 70 - 99 mg/dL   I personally reviewed labs  Imaging: Results for orders placed during the hospital encounter of 08/19/20  MR Brain W and Wo Contrast  Narrative CLINICAL DATA:  Initial evaluation for acute altered mental status.  EXAM: MRI HEAD WITHOUT AND WITH CONTRAST  TECHNIQUE: Multiplanar, multiecho pulse sequences of the brain and surrounding structures were obtained without and with intravenous contrast.  CONTRAST:  7.31mL GADAVIST GADOBUTROL 1 MMOL/ML IV SOLN  COMPARISON:  Comparison made with prior head CT from 08/19/2020 as well as previous CT from 07/31/2020.  FINDINGS: Brain: Postoperative changes from recent right suboccipital craniotomy for resection of a right cerebellar mass are seen. Postoperative changes in blood products present within the resection cavity at the right cerebellum. Previously seen vasogenic edema within the right cerebellum and posterior fossa is markedly improved as compared to previous CT from 07/31/2020, with wide patency of the fourth ventricle now seen. Small amount of residual vasogenic edema is seen within the right cerebellum (series 16, image 9). The tumor itself has been largely resected, with no convincing evidence for residual or locally recurrent tumor at this time, although small tumor could conceivably be obscured by residual postoperative T1 hyperintensity within this region. Note is made of a small curvilinear area of infarction along the margin of the resection cavity (series 5, images 11, 10). Normal expected postoperative dural thickening and enhancement seen about the cerebellum and overlying the right cerebral hemisphere. There is a small subdural hematoma overlying the left occipital convexity measuring no more than 2-3 mm in maximal thickness (series 9, image 13).  No significant mass effect.  Single punctate focus of possible enhancement noted at the posterior left frontotemporal region (series 20, image 14), nonspecific, but could reflect a tiny metastatic implant versus vascular focus of enhancement. No other convincing evidence for metastatic disease elsewhere within the brain. No other abnormal enhancement or mass lesion.  Linear diffusion signal abnormality seen along the course of a prior ventriculostomy tract at the anterior right frontal lobe (series 5, image 34). No other evidence for acute or subacute ischemia elsewhere within the brain. Chronic right MCA distribution infarct involving the right frontal operculum noted. Underlying moderate chronic microvascular ischemic disease. No midline shift or hydrocephalus. Pituitary gland and suprasellar region within normal limits. Midline structures intact.  Vascular: Major intracranial vascular flow voids are grossly maintained at the skull base, although evaluation limited by motion artifact.  Skull and upper cervical spine: Craniocervical junction within normal limits. Bone marrow signal intensity normal. No focal marrow replacing lesion. Post craniotomy changes present at the right suboccipital region without adverse features.  Sinuses/Orbits: Globes and orbital soft tissues within normal limits. Scattered mucosal thickening noted within the left ethmoidal air cells and left maxillary sinus. Paranasal sinuses are otherwise largely clear. Trace right mastoid effusion noted, of doubtful significance.  Other: None.  IMPRESSION: 1. Postoperative changes from recent right suboccipital craniotomy for resection of a right cerebellar mass. No appreciable residual or locally recurrent tumor. Previously seen vasogenic edema within the right cerebellum is markedly improved, with only mild edema now seen within this region. 2. Small acute to subacute ischemic infarct along the margin of  the resection cavity as above. 3. 2-3 mm subdural hematoma overlying the left occipital convexity without significant mass effect,  presumably postoperative. 4. Single punctate focus of possible enhancement involving the posterior left frontotemporal region, nonspecific, but could reflect a tiny metastatic implant versus vascular focus of enhancement. Attention at follow-up recommended. 5. No other acute intracranial abnormality. 6. 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   Electronically Signed By: Jeannine Boga M.D. On: 08/20/2020 03:55  I personally reviewed radiology studies    Impression/Plan:  Ms. Rease is here after recent craniotomy for a brain metastasis resection.  Her MRI does show improvement in the obstructive hydrocephalus and resection of the enhancing mass in the posterior fossa.  There is no obvious sign of infection.  Her incision is well approximated and appears to be healing appropriately.  The primary team has obtained a lumbar puncture.  She does not appear to have any obvious signs of meningitis but she has been started on antibiotics due to clinical concern..  Given the intermittent symptoms, I would recommend evaluation of possible TIAs given her history of stroke.  She should undergo imaging of her blood vessels including a CTA of the head and neck.  Otherwise, defer to neurology for further evaluation.  Given her incision is well-healed, no further treatment of the incision is needed at this time.    Recommend follow-up with her primary surgeon and oncologist.   1.  Diagnosis : Confusion  2.  Plan -MRI brain reassuring, patient was safe to proceed with an LP which was relayed to the primary team -Would recommend continued follow-up with neurology and consideration of evaluation for TIAs. -No surgical intervention needed at this time.

## 2020-08-21 NOTE — Progress Notes (Signed)
Neurology Progress Note   S:// Patient seen and examined at the emergency room at [facility]. Currently, comfortable in bed, complains of leg pain bilaterally. Was able to speak with the daughter-Ms. Williams over the phone. She reports that the patient has not been able to walk now for months. She reports the main reason for coming to the hospital was the fact that the patient was very confused. She was not behaving like herself. The daughter reports that she has noticed over the past 1 month or so that the patient has started exhibiting some bizarre findings-she just stares off in space without realizing and does not respond to voice. 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 The patient was febrile, and encephalopathic on examination-recommendations were made for a spinal tap which is still pending. She is empirically covered with antibiotics. She is Covid negative.  UA and chest x-ray unremarkable for acute process-chest x-ray with right middle lobe mass that is unchanged from before.    O:// Current vital signs: BP 117/82 (BP Location: Left Arm)   Pulse (!) 106   Temp 98.3 F (36.8 C) (Oral)   Resp 16   Ht 5\' 4"  (1.626 m)   Wt 79.4 kg   SpO2 100%   BMI 30.04 kg/m  Vital signs in last 24 hours: Temp:  [98 F (36.7 C)-98.8 F (37.1 C)] 98.3 F (36.8 C) (01/24 1015) Pulse Rate:  [82-125] 106 (01/24 1015) Resp:  [16-20] 16 (01/24 1015) BP: (117-144)/(78-93) 117/82 (01/24 1015) SpO2:  [94 %-100 %] 100 % (01/24 0431) General: Awake alert in no distress HEENT: Normocephalic atraumatic Lungs: Clear Abdomen nondistended nontender Extremities: Warm well perfused with palpable tenderness all over the lower extremities to touch Cardiovascular: Regular rate rhythm Neurological exam Awake alert  oriented to self, the fact that she is in the hospital, the year 2022 but got the month wrong.  She also got her age wrong-She Is 75 Years Old. Mild dysarthria-question dentition related. Able to name simple objects. Reduced attention concentration Cranial nerves: Pupils equal round react light, extraocular movements intact, visual fields full, facial sensation intact, face symmetric, auditory acuity intact, tongue and palate midline, shoulder shrug intact. Motor examination: 4+/5 symmetric strength in bilateral upper extremities without drift.  Normal tone.  Bilateral lower extremities are barely 2/5-at the hip, knee and ankle. Sensory exam: Intact to light touch bilaterally, reports hyperesthesia and pain on touching both her feet. Coordination: No significant ataxia noted on finger-nose-finger testing bilaterally.  Unable to perform heel-knee-shin. Unable to assess gait  Medications  Current Facility-Administered Medications:  .  0.9 %  sodium chloride infusion, 75 mL/hr, Intravenous, Continuous, Judd Gaudier V, MD, Last Rate: 75 mL/hr at 08/21/20 0448, 75 mL/hr at 08/21/20 0448 .  0.9 %  sodium chloride infusion, , Intravenous, Continuous, Athena Masse, MD, Stopped at 08/20/20 1942 .  acetaminophen (TYLENOL) tablet 650 mg, 650 mg, Oral, Q6H PRN **OR** acetaminophen (TYLENOL) suppository 650 mg, 650 mg, Rectal, Q6H PRN, Judd Gaudier V, MD .  ceFEPIme (MAXIPIME) 2 g in sodium chloride 0.9 % 100 mL IVPB, 2 g, Intravenous, Q8H, Ravishankar, Jayashree, MD .  insulin aspart (novoLOG) injection 0-15 Units, 0-15 Units, Subcutaneous, Q4H, Athena Masse, MD, 3 Units at 08/21/20 0448 .  LORazepam (ATIVAN) injection 1-2 mg, 1-2 mg, Intravenous, Q2H PRN, Athena Masse, MD .  ondansetron (ZOFRAN) tablet 4 mg, 4 mg, Oral, Q6H PRN **OR** ondansetron (ZOFRAN) injection 4 mg, 4 mg, Intravenous, Q6H PRN, Athena Masse, MD .  vancomycin (VANCOCIN) IVPB 1000 mg/200 mL premix, 1,000 mg, Intravenous,  Q12H, Ena Dawley, RPH, Stopped at 08/21/20 1638  Current Outpatient Medications:  .  ACETAMINOPHEN EXTRA STRENGTH 500 MG tablet, Take 1,000 mg by mouth every 8 (eight) hours., Disp: , Rfl:  .  atorvastatin (LIPITOR) 40 MG tablet, Take 40 mg by mouth daily., Disp: , Rfl:  .  DULoxetine (CYMBALTA) 60 MG capsule, Take 60 mg by mouth daily., Disp: , Rfl:  .  latanoprost (XALATAN) 0.005 % ophthalmic solution, Place 1 drop into both eyes at bedtime., Disp: , Rfl:  .  lisinopril-hydrochlorothiazide (ZESTORETIC) 20-25 MG tablet, Take 1 tablet by mouth daily., Disp: , Rfl:  .  metoprolol succinate (TOPROL-XL) 50 MG 24 hr tablet, Take 50 mg by mouth daily. Take with or immediately following a meal., Disp: , Rfl:  .  MIRALAX 17 g packet, Take 17 g by mouth daily., Disp: , Rfl:  .  NIFEdipine (ADALAT CC) 90 MG 24 hr tablet, Take 90 mg by mouth daily., Disp: , Rfl:  .  senna-docusate (SENOKOT-S) 8.6-50 MG tablet, Take 2 tablets by mouth in the morning and at bedtime., Disp: , Rfl:  .  sitaGLIPtin-metformin (JANUMET) 50-1000 MG tablet, Take 1 tablet by mouth 2 (two) times daily with a meal., Disp: , Rfl:  Labs CBC    Component Value Date/Time   WBC 13.2 (H) 08/21/2020 0432   RBC 3.62 (L) 08/21/2020 0432   HGB 10.2 (L) 08/21/2020 0432   HCT 31.0 (L) 08/21/2020 0432   PLT 288 08/21/2020 0432   MCV 85.6 08/21/2020 0432   MCH 28.2 08/21/2020 0432   MCHC 32.9 08/21/2020 0432   RDW 15.4 08/21/2020 0432   LYMPHSABS 1.0 08/20/2020 1014   MONOABS 0.2 08/20/2020 1014   EOSABS 0.0 08/20/2020 1014   BASOSABS 0.0 08/20/2020 1014    CMP     Component Value Date/Time   NA 140 08/21/2020 0432   K 3.3 (L) 08/21/2020 0432   CL 106 08/21/2020 0432   CO2 22 08/21/2020 0432   GLUCOSE 213 (H) 08/21/2020 0432   BUN 18 08/21/2020 0432   CREATININE 0.54 08/21/2020 0432   CALCIUM 8.8 (L) 08/21/2020 0432   PROT 6.7 08/20/2020 1014   ALBUMIN 3.1 (L) 08/20/2020 1014   AST 16 08/20/2020 1014   ALT 12 08/20/2020  1014   ALKPHOS 56 08/20/2020 1014   BILITOT 0.9 08/20/2020 1014   GFRNONAA >60 08/21/2020 0432    glycosylated hemoglobin  Lipid Panel     Component Value Date/Time   CHOL 112 08/01/2020 0705   TRIG 128 08/01/2020 0705   HDL 35 (L) 08/01/2020 0705   CHOLHDL 3.2 08/01/2020 0705   VLDL 26 08/01/2020 0705   LDLCALC 51 08/01/2020 0705     Imaging I have reviewed images in epic and the results pertinent to this consultation are: MRI of the spine for metastases-negative for mets. MR head with without contrast shows postop changes from recent right suboccipital craniotomy for resection of the right cerebellar mass, no appreciable residual or locally recurrent tumor, previously seen vasogenic edema within the right cerebellum is markedly improved with only mild edema now seen within this region.  Small acute to subacute ischemic infarctions along the margin of the resection cavity.  2 to 3 mm subdural hematoma overlying the left occipital convexity  without significant mass-effect, again presumably postoperative due to the decompression.  Single punctate focus of possible enhancement involving the posterior left frontotemporal region nonspecific but could reflect a tiny metastatic implant versus vascular focus of enhancement-needs attention on follow-up.  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  Assessment:  75 year old woman with lung cancer status post resection of right cerebellar metastases, presenting with encephalopathy for few days along with fever, tachycardia and elevated lactate level along with what initially was 4 to 5-week history of bilateral lower extremity weakness and increased urinary frequency but on further clarification, the leg weakness has been going on for months now. Yesterday she was somnolent, encephalopathic and also had bilateral lid ptosis and significant bilateral lower extremity weakness along with lower  extremity-Per reflexia and tenderness to palpation. On my examination today, she was not hyperreflexive, much more awake, much more cooperative with the exam, with mild reduced attention concentration and I did not appreciate any lid ptosis at my encounter. Her fevers have subsided. Her lactate is also normalized. She still awaiting a spinal tap No clear source of infection from the work-up till now.  Initial consultation recommendations included work-up for Lambert-Eaton myasthenic syndromes along with evaluation for CNS infection and CNS metastatic surveillance. No evidence of spinal mets on imaging.   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  Will need outpatient evaluation with EMG/NCS -Evaluate for seizure given the history of staring spells  Recommendations: LP with the studies as recommended in the consultation note. EEG-routine No need for antiepileptics for now.  Might change our recommendation based on EEG findings. Follow-up on the lab work as recommended in the initial consultation. Continue aspirin for stroke prevention-for the chronic right MCA stroke.  Family was unaware of any symptoms from that stroke or the existence of the stroke.  Some of the left-sided lower extremity weakness theoretically could be attributed to the chronic right MCA infarction.  Discussed my plan with primary hospitalist Dr. [place] Pink over secure chat.  Also updated the daughter on the neurological plan. Amie Portland, MD Neurologist [facility] Pager: [phone]

## 2020-08-21 NOTE — Progress Notes (Signed)
eeg done °

## 2020-08-21 NOTE — Procedures (Deleted)
eeg cannot be done in ED hallway.  Need to have a room in order to perform eeg.

## 2020-08-21 NOTE — Consult Note (Signed)
NAME: Cheyenne Anthony  DOB: January 21, 1946  MRN: [id number]  Date/Time: 08/21/2020 2:13 PM  REQUESTING PROVIDER: Dr.Sheikh Subjective:  REASON FOR CONSULT: CNS infection ?Chart reviewed in detail, spoke to daughter at bed side- pt unable to give history Cheyenne Anthony is a 75 y.o. female  With HTN, DM, hyperlipidemia, former smoker newly diagnosed lung cancer and cerebellar mets [facility] with altered mental status weakness legs on 08/19/2020. Pt had recently presented to [facility] on 07/31/20 with confusion and altered mental status.  She was found to have in the chest x-ray of 5 cm rounded opacity of the right middle lobe.  CT chest was done subsequently and also CT head showed a 4 to 5 cm mass lesion within the right cerebellum with areas of internal necrosis surrounding vasogenic edema and mass-effect upon the fourth ventricle with obstructive hydrocephalus of the lateral and third ventricles.  She was transferred to [facility] on 08/01/2020.  She underwent BrainLab craniotomy and resection of the tumor on 08/02/2020.  She also had a right frontal EVD placed.  On 08/04/2020 she went back for surgery to remove the residual tumor.  The EVD was removed on 08/05/2020.  She was discharged to SNF on 08/09/2020.  At [facility] she was on tapering dose of decadron. Her daughter went to see her at [facility] and noticed that she was not as responsive as previously.  Usually patient would respond to her greetings but on 08/19/2020 she found that the patient was lethargic and also was not eating and was weak in her legs.  She also had a fever.  Rapid Covid test at the facility was negative.  She was brought to [facility] through EMS. In the emergency room temperature was 100.6 rectally, heart rate 105, BP 131/79, oxygen sats 96%.  Labs revealed WBC of 10,000, Hb 10.8, lactate 2.5, potassium 2.8, creatinine 0.68. CT head initially revealed mass of right cerebellum with old infarct of right frontal lobe with encephalomalacia.  MRI with and  without contrast was done which showed postoperative changes from recent right suboccipital craniotomy for resection of his right cerebellar mass.  There was no appreciable residual or locally recurrent tumor.  Previously seen vasogenic edema was also markedly improved.  There was a small acute to subacute ischemic infarct along the margin of the resection cavity.  There was a 2 to 3 mm subdural hematoma overlying the left occipital convexity without significant mass-effect presumably postoperative.  Patient was seen by neurosurgery who did not think that she needed any surgical intervention.  Blood cultures were sent and she was started on IV cefepime and vancomycin.  She was seen by neurologist and lumbar puncture was done today.  He also ordered MRI of the spine to look for any mets.  CSF revealed by 67 RBCs, WBC 39 with 85% lymphocytes and 8% neutrophils.  Total protein was 63 and blood glucose was 121.  Cultures were sent.  I am asked to see the patient to manage for any infection.    Past Medical History:  Diagnosis Date  . Brain mass 07/31/2020  . Cancer ([place])   . Hypertension   . Right lower lobe lung mass   . Type II diabetes mellitus with renal manifestations ([place])     History reviewed. No pertinent surgical history.  Social History   Socioeconomic History  . Marital status: Unknown    Spouse name: Not on file  . Number of children: Not on file  . Years of education: Not on  file  . Highest education level: Not on file  Occupational History  . Not on file  Tobacco Use  . Smoking status: Former Smoker    Types: Cigarettes    Quit date: 07/31/2020    Years since quitting: 0.0  . Smokeless tobacco: Never Used  Substance and Sexual Activity  . Alcohol use: Not Currently    Comment: occ  . Drug use: Never  . Sexual activity: Not on file  Other Topics Concern  . Not on file  Social History Narrative  . Not on file   Social Determinants of Health   Financial Resource  Strain: Not on file  Food Insecurity: Not on file  Transportation Needs: Not on file  Physical Activity: Not on file  Stress: Not on file  Social Connections: Not on file  Intimate Partner Violence: Not on file    Family History  Problem Relation Age of Onset  . Stroke Brother    No Known Allergies  ? Current Facility-Administered Medications  Medication Dose Route Frequency Provider Last Rate Last Admin  . 0.9 %  sodium chloride infusion  75 mL/hr Intravenous Continuous Athena Masse, MD 75 mL/hr at 08/21/20 0448 75 mL/hr at 08/21/20 0448  . 0.9 %  sodium chloride infusion   Intravenous Continuous Athena Masse, MD   Stopped at 08/20/20 1942  . acetaminophen (TYLENOL) tablet 650 mg  650 mg Oral Q6H PRN Athena Masse, MD       Or  . acetaminophen (TYLENOL) suppository 650 mg  650 mg Rectal Q6H PRN Athena Masse, MD      . ceFEPIme (MAXIPIME) 2 g in sodium chloride 0.9 % 100 mL IVPB  2 g Intravenous Q8H Takelia Urieta, Joellyn Quails, MD 200 mL/hr at 08/21/20 1213 2 g at 08/21/20 1213  . insulin aspart (novoLOG) injection 0-15 Units  0-15 Units Subcutaneous Q4H Athena Masse, MD   3 Units at 08/21/20 1212  . LORazepam (ATIVAN) injection 1-2 mg  1-2 mg Intravenous Q2H PRN Athena Masse, MD      . ondansetron [facility]) tablet 4 mg  4 mg Oral Q6H PRN Athena Masse, MD       Or  . ondansetron [facility]) injection 4 mg  4 mg Intravenous Q6H PRN Athena Masse, MD      . vancomycin (VANCOCIN) IVPB 1000 mg/200 mL premix  1,000 mg Intravenous Q12H Ena Dawley, RPH   Stopped at 08/21/20 4132   Current Outpatient Medications  Medication Sig Dispense Refill  . ACETAMINOPHEN EXTRA STRENGTH 500 MG tablet Take 1,000 mg by mouth every 8 (eight) hours.    Marland Kitchen atorvastatin (LIPITOR) 40 MG tablet Take 40 mg by mouth daily.    . DULoxetine (CYMBALTA) 60 MG capsule Take 60 mg by mouth daily.    Marland Kitchen latanoprost (XALATAN) 0.005 % ophthalmic solution Place 1 drop into both eyes at bedtime.    Marland Kitchen  lisinopril-hydrochlorothiazide (ZESTORETIC) 20-25 MG tablet Take 1 tablet by mouth daily.    . metoprolol succinate (TOPROL-XL) 50 MG 24 hr tablet Take 50 mg by mouth daily. Take with or immediately following a meal.    . MIRALAX 17 g packet Take 17 g by mouth daily.    Marland Kitchen NIFEdipine (ADALAT CC) 90 MG 24 hr tablet Take 90 mg by mouth daily.    Marland Kitchen senna-docusate (SENOKOT-S) 8.6-50 MG tablet Take 2 tablets by mouth in the morning and at bedtime.    . sitaGLIPtin-metformin (JANUMET) 50-1000 MG tablet  Take 1 tablet by mouth 2 (two) times daily with a meal.       Abtx:  Anti-infectives (From admission, onward)   Start     Dose/Rate Route Frequency Ordered Stop   08/21/20 1200  ceFEPIme (MAXIPIME) 2 g in sodium chloride 0.9 % 100 mL IVPB        2 g 200 mL/hr over 30 Minutes Intravenous Every 8 hours 08/21/20 1001     08/20/20 1200  vancomycin (VANCOCIN) IVPB 1000 mg/200 mL premix        1,000 mg 200 mL/hr over 60 Minutes Intravenous Every 12 hours 08/20/20 0532     08/20/20 0600  cefTRIAXone (ROCEPHIN) 2 g in sodium chloride 0.9 % 100 mL IVPB  Status:  Discontinued        2 g 200 mL/hr over 30 Minutes Intravenous Every 12 hours 08/20/20 0521 08/21/20 1001   08/20/20 0530  vancomycin (VANCOCIN) IVPB 1000 mg/200 mL premix        1,000 mg 200 mL/hr over 60 Minutes Intravenous  Once 08/20/20 0521 08/20/20 0752      REVIEW OF SYSTEMS: Patient says she is feeling better.  But she is not reliable because of her slightly confused status. Const:  fever, negative chills, negative weight loss Eyes: negative diplopia or visual changes, negative eye pain ENT: negative coryza, negative sore throat Resp: negative cough, hemoptysis, dyspnea Cards: negative for chest pain, palpitations, lower extremity edema GU: negative for frequency, dysuria and hematuria GI: Negative for abdominal pain, diarrhea, bleeding, constipation Skin: negative for rash and pruritus Heme: negative for easy bruising and gum/nose  bleeding MS: Weakness legs. Neurolo confusion Psych: negative for feelings of anxiety, depression  Endocrine: negative for thyroid, diabetes Allergy/Immunology- negative for any medication or food allergies ? Objective:  VITALS:  BP (!) 152/87 (BP Location: Left Arm)   Pulse (!) 105   Temp 98.1 F (36.7 C) (Oral)   Resp 16   Ht 5\' 4"  (1.626 m)   Wt 79.4 kg   SpO2 95%   BMI 30.04 kg/m  PHYSICAL EXAM:  General: Awake able to follow commands Some confusion But no distress No neck rigidity  Head: Right parietal area surgical site is healed well with sutures in place no erythema or discharge  eyes: Conjunctivae clear, anicteric sclerae. Pupils are equal ENT Nares normal. No drainage or sinus tenderness. Lips, mucosa, and tongue normal. No Thrush Neck: Supple, symmetrical, no adenopathy, thyroid: non tender no carotid bruit and no JVD. Back: No CVA tenderness. Lungs: Clear to auscultation bilaterally. No Wheezing or Rhonchi. No rales. Heart: Regular rate and rhythm, no murmur, rub or gallop. Abdomen: Soft,  distended. Bowel sounds normal. No masses Extremities: Edema legs left more than right skin: No rashes or lesions. Or bruising Lymph: Cervical, supraclavicular normal. Neurologic: Did not examine in detail.  But she is able to move her  upper extremities much better than the lower Pertinent Labs Lab Results CBC    Component Value Date/Time   WBC 13.2 (H) 08/21/2020 0432   RBC 3.62 (L) 08/21/2020 0432   HGB 10.2 (L) 08/21/2020 0432   HCT 31.0 (L) 08/21/2020 0432   PLT 288 08/21/2020 0432   MCV 85.6 08/21/2020 0432   MCH 28.2 08/21/2020 0432   MCHC 32.9 08/21/2020 0432   RDW 15.4 08/21/2020 0432   LYMPHSABS 1.0 08/20/2020 1014   MONOABS 0.2 08/20/2020 1014   EOSABS 0.0 08/20/2020 1014   BASOSABS 0.0 08/20/2020 1014    CMP Latest  Ref Rng & Units 08/21/2020 08/20/2020 08/19/2020  Glucose 70 - 99 mg/dL 213(H) 267(H) 218(H)  BUN 8 - 23 mg/dL 18 18 28(H)  Creatinine  0.44 - 1.00 mg/dL 0.54 0.53 0.68  Sodium 135 - 145 mmol/L 140 138 138  Potassium 3.5 - 5.1 mmol/L 3.3(L) 3.6 2.8(L)  Chloride 98 - 111 mmol/L 106 103 99  CO2 22 - 32 mmol/L 22 23 24   Calcium 8.9 - 10.3 mg/dL 8.8(L) 8.8(L) 9.3  Total Protein 6.5 - 8.1 g/dL - 6.7 6.9  Total Bilirubin 0.3 - 1.2 mg/dL - 0.9 1.2  Alkaline Phos 38 - 126 U/L - 56 56  AST 15 - 41 U/L - 16 19  ALT 0 - 44 U/L - 12 11      Microbiology: Recent Results (from the past 240 hour(s))  Urine Culture     Status: None   Collection Time: 08/19/20 11:46 PM   Specimen: Urine, Random  Result Value Ref Range Status   Specimen Description   Final    URINE, RANDOM Performed at [facility], [address], [place], [place] [address]    Special Requests   Final    NONE Performed at [facility], [address], [place], [place] [address]    Culture   Final    NO GROWTH Performed at [facility], [place] [address] [place], [place] [address]    Report Status 08/21/2020 FINAL  Final  SARS Coronavirus 2 by RT PCR (hospital order, performed in [facility] hospital lab) Nasopharyngeal Nasopharyngeal Swab     Status: None   Collection Time: 08/20/20 12:39 AM   Specimen: Nasopharyngeal Swab  Result Value Ref Range Status   SARS Coronavirus 2 NEGATIVE NEGATIVE Final    Comment: (NOTE) SARS-CoV-2 target nucleic acids are NOT DETECTED.  The SARS-CoV-2 RNA is generally detectable in upper and lower respiratory specimens during the acute phase of infection. The lowest concentration of SARS-CoV-2 viral copies this assay can detect is 250 copies / mL. A negative result does not preclude SARS-CoV-2 infection and should not be used as the sole basis for treatment or other patient management decisions.  A negative result may occur with improper specimen collection / handling, submission of specimen other than nasopharyngeal swab, presence of viral mutation(s) within the areas targeted by this assay, and  inadequate number of viral copies (<250 copies / mL). A negative result must be combined with clinical observations, patient history, and epidemiological information.  Fact Sheet for Patients:   StrictlyIdeas.no  Fact Sheet for Healthcare Providers: BankingDealers.co.za  This test is not yet approved or  cleared by the [place] FDA and has been authorized for detection and/or diagnosis of SARS-CoV-2 by FDA under an Emergency Use Authorization (EUA).  This EUA will remain in effect (meaning this test can be used) for the duration of the COVID-19 declaration under Section 564(b)(1) of the Act, 21 U.S.C. section 360bbb-3(b)(1), unless the authorization is terminated or revoked sooner.  Performed at [facility], [address] [place], [place] [address]   Culture, blood (Routine X 2) w Reflex to ID Panel     Status: None (Preliminary result)   Collection Time: 08/20/20 12:39 AM   Specimen: BLOOD  Result Value Ref Range Status   Specimen Description BLOOD BLOOD RIGHT HAND  Final   Special Requests   Final    BOTTLES DRAWN AEROBIC AND ANAEROBIC Blood Culture results may not be optimal due to an excessive volume of  blood received in culture bottles   Culture   Final    NO GROWTH 1 DAY Performed at [facility], [address] [place], [place] [address]    Report Status PENDING  Incomplete  Culture, blood (Routine X 2) w Reflex to ID Panel     Status: None (Preliminary result)   Collection Time: 08/20/20 12:39 AM   Specimen: BLOOD  Result Value Ref Range Status   Specimen Description BLOOD BLOOD LEFT WRIST  Final   Special Requests   Final    BOTTLES DRAWN AEROBIC AND ANAEROBIC Blood Culture results may not be optimal due to an excessive volume of blood received in culture bottles   Culture   Final    NO GROWTH 1 DAY Performed at [facility], [address] [place], [place] [address]    Report  Status PENDING  Incomplete  MRSA PCR Screening     Status: None   Collection Time: 08/20/20 12:43 PM   Specimen: Nasopharyngeal  Result Value Ref Range Status   MRSA by PCR NEGATIVE NEGATIVE Final    Comment:        The GeneXpert MRSA Assay (FDA approved for NASAL specimens only), is one component of a comprehensive MRSA colonization surveillance program. It is not intended to diagnose MRSA infection nor to guide or monitor treatment for MRSA infections. Performed at [facility], [address] [place], [place] [address]   CSF culture     Status: None (Preliminary result)   Collection Time: 08/21/20  8:43 AM   Specimen: PATH Cytology CSF; Cerebrospinal Fluid  Result Value Ref Range Status   Specimen Description CSF  Final   Special Requests NONE  Final   Gram Stain   Final    NO ORGANISMS SEEN WBC SEEN RBC SEEN Performed at [facility], [address] [place], [place] [address]    Culture PENDING  Incomplete   Report Status PENDING  Incomplete    IMAGING RESULTS:  Postoperative changes from recent right suboccipital craniotomy for resection of a right cerebellar mass. No appreciable residual or locally recurrent tumor. Previously seen vasogenic edema within the right cerebellum is markedly improved, with only mild edema now seen within this region. 2. Small acute to subacute ischemic infarct along the margin of the resection cavity as above. 3. 2-3 mm subdural hematoma overlying the left occipital convexity without significant mass effect, presumably postoperative. 4. Single punctate focus of possible enhancement involving the posterior left frontotemporal region, nonspecific, but could reflect a tiny metastatic implant versus vascular focus of enhancement. Attention at follow-up recommended. 5. No other acute intracranial abnormality. 6. 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 I have  personally reviewed the films ? Impression/Recommendation ? ?Lung malignancy with right cerebellar mets. Status post resection of the cerebellar tumor on 08/02/2020 and 08/04/2020. She had EVD for 3 days. Encephalopathy.  Concern for CNS infection. MRI did not show any changes other than postoperative. Lumbar puncture done showed minimal lymphocytic pleocytosis and slightly elevated protein and glucose.  Not typical for an infection. Currently on vancomycin and ceftriaxone.  We will change the latter to cefepime or ceftazidime. Await CSF cultures. Need to rule out paraneoplastic syndrome which can  cause encephalopathy   Low cortisol level of 1.5.  She was on Decadron when she was at [facility].  Not sure when it was tapered and stopped. Rule out adrenal crisis.  This could clearly explain her altered mental status  weakness and fever.  She needs to be on stress dose hydrocortisone.  Left nonocclusive thrombus within the profundofemoral vein.  There was no deep vein thrombosis. ___________________________________________________ Discussed with patient, her daughter and the hospitalist.    Note:  This document was prepared using Dragon voice recognition software and may include unintentional dictation errors.

## 2020-08-22 DIAGNOSIS — R509 Fever, unspecified: Secondary | ICD-10-CM | POA: Diagnosis not present

## 2020-08-22 DIAGNOSIS — R531 Weakness: Secondary | ICD-10-CM

## 2020-08-22 DIAGNOSIS — E272 Addisonian crisis: Secondary | ICD-10-CM

## 2020-08-22 DIAGNOSIS — G9340 Encephalopathy, unspecified: Secondary | ICD-10-CM

## 2020-08-22 DIAGNOSIS — E876 Hypokalemia: Secondary | ICD-10-CM

## 2020-08-22 DIAGNOSIS — R4182 Altered mental status, unspecified: Secondary | ICD-10-CM

## 2020-08-22 LAB — COMPREHENSIVE METABOLIC PANEL
ALT: 12 U/L (ref 0–44)
AST: 18 U/L (ref 15–41)
Albumin: 2.6 g/dL — ABNORMAL LOW (ref 3.5–5.0)
Alkaline Phosphatase: 54 U/L (ref 38–126)
Anion gap: 11 (ref 5–15)
BUN: 14 mg/dL (ref 8–23)
CO2: 22 mmol/L (ref 22–32)
Calcium: 8.5 mg/dL — ABNORMAL LOW (ref 8.9–10.3)
Chloride: 107 mmol/L (ref 98–111)
Creatinine, Ser: 0.51 mg/dL (ref 0.44–1.00)
GFR, Estimated: >60 mL/min (ref >60)
Glucose, Bld: 168 mg/dL — ABNORMAL HIGH (ref 70–99)
Potassium: 2.9 mmol/L — ABNORMAL LOW (ref 3.5–5.1)
Sodium: 140 mmol/L (ref 135–145)
Total Bilirubin: 0.7 mg/dL (ref 0.3–1.2)
Total Protein: 5.9 g/dL — ABNORMAL LOW (ref 6.5–8.1)

## 2020-08-22 LAB — CBC
HCT: 30.7 % — ABNORMAL LOW (ref 36.0–46.0)
Hemoglobin: 9.8 g/dL — ABNORMAL LOW (ref 12.0–15.0)
MCH: 27.7 pg (ref 26.0–34.0)
MCHC: 31.9 g/dL (ref 30.0–36.0)
MCV: 86.7 fL (ref 80.0–100.0)
Platelets: 252 10*3/uL (ref 150–400)
RBC: 3.54 MIL/uL — ABNORMAL LOW (ref 3.87–5.11)
RDW: 15.5 % (ref 11.5–15.5)
WBC: 11 10*3/uL — ABNORMAL HIGH (ref 4.0–10.5)
nRBC: 0 % (ref 0.0–0.2)

## 2020-08-22 LAB — GLUCOSE, CAPILLARY
Glucose-Capillary: 139 mg/dL — ABNORMAL HIGH (ref 70–99)
Glucose-Capillary: 144 mg/dL — ABNORMAL HIGH (ref 70–99)
Glucose-Capillary: 158 mg/dL — ABNORMAL HIGH (ref 70–99)
Glucose-Capillary: 162 mg/dL — ABNORMAL HIGH (ref 70–99)
Glucose-Capillary: 216 mg/dL — ABNORMAL HIGH (ref 70–99)
Glucose-Capillary: 242 mg/dL — ABNORMAL HIGH (ref 70–99)

## 2020-08-22 LAB — MAGNESIUM: Magnesium: 1.7 mg/dL (ref 1.7–2.4)

## 2020-08-22 LAB — AMMONIA: Ammonia: 17 umol/L (ref 9–35)

## 2020-08-22 LAB — VANCOMYCIN, TROUGH: Vancomycin Tr: 16 ug/mL (ref 15–20)

## 2020-08-22 LAB — CYTOLOGY - NON PAP

## 2020-08-22 LAB — VDRL, CSF: VDRL Quant, CSF: NONREACTIVE

## 2020-08-22 MED ORDER — SODIUM CHLORIDE 0.9 % IV SOLN
2.0000 g | Freq: Three times a day (TID) | INTRAVENOUS | Status: AC
  Administered 2020-08-22 (×3): 2 g via INTRAVENOUS
  Filled 2020-08-22 (×5): qty 2

## 2020-08-22 MED ORDER — HYDROCORTISONE 5 MG PO TABS
5.0000 mg | ORAL_TABLET | Freq: Every day | ORAL | Status: DC
Start: 2020-08-22 — End: 2020-08-25
  Administered 2020-08-22 – 2020-08-24 (×3): 5 mg via ORAL
  Filled 2020-08-22 (×4): qty 1

## 2020-08-22 MED ORDER — POTASSIUM CHLORIDE CRYS ER 20 MEQ PO TBCR
40.0000 meq | EXTENDED_RELEASE_TABLET | ORAL | Status: AC
  Administered 2020-08-22 (×2): 40 meq via ORAL
  Filled 2020-08-22 (×2): qty 2

## 2020-08-22 MED ORDER — MAGNESIUM SULFATE 2 GM/50ML IV SOLN
2.0000 g | Freq: Once | INTRAVENOUS | Status: AC
  Administered 2020-08-22: 2 g via INTRAVENOUS
  Filled 2020-08-22: qty 50

## 2020-08-22 NOTE — Progress Notes (Signed)
Neurology Progress Note   S:// Seen and examined. Awake, alert, in NAD    O:// Current vital signs: BP 125/82 (BP Location: Right Arm)   Pulse (!) 111   Temp 98.1 F (36.7 C)   Resp 20   Ht 5\' 4"  (1.626 m)   Wt 79.4 kg   SpO2 97%   BMI 30.04 kg/m  Vital signs in last 24 hours: Temp:  [97.9 F (36.6 C)-98.7 F (37.1 C)] 98.1 F (36.7 C) (01/25 0731) Pulse Rate:  [100-116] 111 (01/25 0731) Resp:  [16-20] 20 (01/25 0731) BP: (117-152)/(76-95) 125/82 (01/25 0731) SpO2:  [93 %-97 %] 97 % (01/25 0731) General: Awake alert in no distress HEENT: Normocephalic atraumatic Lungs: Clear Abdomen nondistended nontender Extremities: Warm well perfused with palpable tenderness all over the lower extremities to touch Cardiovascular: Regular rate rhythm Neurological exam Awake alert oriented to self, the fact that she is in the hospital, the year 2022 but got the month wrong.  She also got her age wrong-She Is 75 Years Old. No dysarthria noted Able to name simple objects. Reduced attention concentration Cranial nerves: Pupils equal round react light, extraocular movements intact, visual fields full, facial sensation intact, face grossly symmetric-question subtle right NLF flattening, auditory acuity intact, tongue and palate midline, shoulder shrug intact. Motor examination: 4+ to 5/5 symmetric strength in bilateral upper extremities without drift.  Normal tone.  Bilateral lower extremities are barely 2-3/5-at the hip, knee and 4/5 ankle-limited by pain. Sensory exam: Intact to light touch bilaterally, reports hyperesthesia and pain on touching both her feet. Coordination: No significant ataxia noted on finger-nose-finger testing bilaterally.  Unable to perform heel-knee-shin. Unable to assess gait  Medications  Current Facility-Administered Medications:  .  0.9 %  sodium chloride infusion, 75 mL/hr, Intravenous, Continuous, Judd Gaudier V, MD, Last Rate: 75 mL/hr at 08/21/20 0448,  75 mL/hr at 08/21/20 0448 .  acetaminophen (TYLENOL) tablet 650 mg, 650 mg, Oral, Q6H PRN **OR** acetaminophen (TYLENOL) suppository 650 mg, 650 mg, Rectal, Q6H PRN, Athena Masse, MD .  cefTAZidime (FORTAZ) 2 g in sodium chloride 0.9 % 100 mL IVPB, 2 g, Intravenous, Q8H, Belue, Alver Sorrow, RPH, Last Rate: 200 mL/hr at 08/22/20 0436, Infusion Verify at 08/22/20 0436 .  DULoxetine (CYMBALTA) DR capsule 60 mg, 60 mg, Oral, Daily, Annita Brod, MD, 60 mg at 08/21/20 2340 .  hydrocortisone (CORTEF) tablet 10 mg, 10 mg, Oral, Daily, Gevena Barre K, MD .  hydrocortisone (CORTEF) tablet 5 mg, 5 mg, Oral, Daily, Amin, Sumayya, MD .  insulin aspart (novoLOG) injection 0-15 Units, 0-15 Units, Subcutaneous, Q4H, Athena Masse, MD, 2 Units at 08/22/20 [id number] .  insulin glargine (LANTUS) injection 5 Units, 5 Units, Subcutaneous, QHS, Annita Brod, MD, 5 Units at 08/21/20 2343 .  latanoprost (XALATAN) 0.005 % ophthalmic solution 1 drop, 1 drop, Both Eyes, QHS, Annita Brod, MD, 1 drop at 08/21/20 2341 .  LORazepam (ATIVAN) injection 1-2 mg, 1-2 mg, Intravenous, Q2H PRN, Athena Masse, MD .  metoprolol succinate (TOPROL-XL) 24 hr tablet 50 mg, 50 mg, Oral, Daily, [place] Pink, Sendil K, MD .  ondansetron (ZOFRAN) tablet 4 mg, 4 mg, Oral, Q6H PRN **OR** ondansetron (ZOFRAN) injection 4 mg, 4 mg, Intravenous, Q6H PRN, Judd Gaudier V, MD .  potassium chloride SA (KLOR-CON) CR tablet 40 mEq, 40 mEq, Oral, Q4H, Amin, Sumayya, MD .  vancomycin (VANCOCIN) IVPB 1000 mg/200 mL premix, 1,000 mg, Intravenous, Q12H, Ena Dawley, RPH, Stopped at 08/22/20 0123 Labs  CBC    Component Value Date/Time   WBC 11.0 (H) 08/22/2020 0553   RBC 3.54 (L) 08/22/2020 0553   HGB 9.8 (L) 08/22/2020 0553   HCT 30.7 (L) 08/22/2020 0553   PLT 252 08/22/2020 0553   MCV 86.7 08/22/2020 0553   MCH 27.7 08/22/2020 0553   MCHC 31.9 08/22/2020 0553   RDW 15.5 08/22/2020 0553   LYMPHSABS 1.0 08/20/2020 1014   MONOABS  0.2 08/20/2020 1014   EOSABS 0.0 08/20/2020 1014   BASOSABS 0.0 08/20/2020 1014    CMP     Component Value Date/Time   NA 140 08/22/2020 0553   K 2.9 (L) 08/22/2020 0553   CL 107 08/22/2020 0553   CO2 22 08/22/2020 0553   GLUCOSE 168 (H) 08/22/2020 0553   BUN 14 08/22/2020 0553   CREATININE 0.51 08/22/2020 0553   CALCIUM 8.5 (L) 08/22/2020 0553   PROT 5.9 (L) 08/22/2020 0553   ALBUMIN 2.6 (L) 08/22/2020 0553   AST 18 08/22/2020 0553   ALT 12 08/22/2020 0553   ALKPHOS 54 08/22/2020 0553   BILITOT 0.7 08/22/2020 0553   GFRNONAA >60 08/22/2020 0553    glycosylated hemoglobin  Lipid Panel     Component Value Date/Time   CHOL 112 08/01/2020 0705   TRIG 128 08/01/2020 0705   HDL 35 (L) 08/01/2020 0705   CHOLHDL 3.2 08/01/2020 0705   VLDL 26 08/01/2020 0705   LDLCALC 51 08/01/2020 0705     EEG-normal.   Imaging I have reviewed images in epic and the results pertinent to this consultation are: MRI of the spine for metastases-negative for mets. MR head with without contrast shows postop changes from recent right suboccipital craniotomy for resection of the right cerebellar mass, no appreciable residual or locally recurrent tumor, previously seen vasogenic edema within the right cerebellum is markedly improved with only mild edema now seen within this region.  Small acute to subacute ischemic infarctions along the margin of the resection cavity.  2 to 3 mm subdural hematoma overlying the left occipital convexity without significant mass-effect, again presumably postoperative due to the decompression.  Single punctate focus of possible enhancement involving the posterior left frontotemporal region nonspecific but could reflect a tiny metastatic implant versus vascular focus of enhancement-needs attention on follow-up.  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  Assessment:  75 year old woman with lung cancer status post  resection of right cerebellar metastases, presenting with encephalopathy for few days along with fever, tachycardia and elevated lactate level along with what initially was 4 to 5-week history of bilateral lower extremity weakness and increased urinary frequency but on further clarification, the leg weakness has been going on for months now. She was somnolent, encephalopathic and also had bilateral lid ptosis and significant bilateral lower extremity weakness along with lower extremity hyperreflexia and tenderness to palpation. Today, Not hyperreflexive, much more awake, much more cooperative with the exam, with mild reduced attention concentration and I did not appreciate any lid ptosis at my encounter. Her fevers have subsided. Her lactate is also normalized. LP done - CSF with bloody tap and pleocytosis with mild elevated protein. Mainly lymphocytic. CSF c/s neg thus far. Findings less likely infectious and more in favor of reactive changes post op.  Initial consultation recommendations included work-up for Lambert-Eaton myasthenic syndromes along with evaluation for CNS infection and CNS metastatic surveillance. No evidence of spinal mets on imaging.   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  Will need outpatient evaluation with EMG/NCS. No further inpatient w/u -Evaluate for seizure given the history of staring spells-normal EEG, will not initiate AEDs  Recommendations: -No need for antiepileptics for now.  If has more staring spells, or has a seizure, will initiate AED then. -ASA when OK with NSGY given post op status as well as  SDH -Outpatient neurology workup for bilateral LE weakness. -Antibiotics can be de-escalated per ID  -Appreciate ID input. Also started on stress dose steroids. -No further inpatient neurological work up at this time. -Please call with questions.  Amie Portland, MD Neurologist [facility] Pager: [phone]

## 2020-08-22 NOTE — Progress Notes (Signed)
PROGRESS NOTE    Cheyenne Anthony  ZOX:[id number] DOB: 05/18/1946 DOA: 08/19/2020 PCP: [facility]   Brief Narrative: Taken from prior notes. Patient is a 75 year old female with past medical history of diabetes, hypertension and recently diagnosed lung cancer with cerebellar brain metastases status post suboccipital craniotomy on 1/5 at [facility] discharged to skilled nursing on 1/12 and sent to the emergency room on 1/22 from the nursing facility for confusion.  In the emergency room, noted to have a rectal temperature of 100.6 and MRI noted a small acute to subacute ischemic infarct along margins of resection cavity with small 2 to 3 cm subdural hematoma over the left occipital condyle with no appreciable residual or local recurrent tumor.  Neurosurgery consulted who felt that findings appear to be more in line with patient's recent surgical procedure and recommended neurology consult and EEG.  Admitted to the hospitalist service.  Patient noted to have elevated lactic acid level on admission so started on broad-spectrum antibiotics as concerns for meningitis.  Following admission, patient much more alert and interactive, looking to be back to baseline by day 2.  Seen by neurology and after cleared by neurosurgery, LP done on 1/24 by fluoroscopy.  CSF with high RBC and white cells, lymphocytic predominant, increased protein, cultures remain negative, more consistent with reactive with recent surgery.  EEG was done and it was negative for any seizure-like activity.  There was some concern of staring spells.  Neurology does not want to start any antiseizure medications and can consider if she continued to have more spells.  No more spells noted.  There was some concern of paraneoplastic syndrome/Lambert-Eaton syndrome due to bilateral lower extremity weakness, paraneoplastic labs along with calcium channel antibodies were sent by neurology.  According to neurology note it seems more like  deconditioning.  Mentation improved and she appears to be at her baseline now.  Subjective: Patient has no new complaint when seen today.  She thinks that she is at her baseline.  She wants to go home, stating that her daughter can take care of her.  Continue to feel weak in both of her legs.  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  Resolved. Patient had abnormal CT head findings with history of metastatic lung cancer with cerebellar mets s/p post craniotomy. CSF cultures were negative.  Elevated white cell count with lymphocytic predominance was more consistent with reactive and her history of malignancy. She was initially started on ceftriaxone and vancomycin and later transitioned to ceftazidime and vancomycin.  ID was consulted. -We will defer decision about continuation of antibiotics or stopping it to ID. -No obvious source of infection so sepsis ruled out.  Bilateral lower extremity weakness.  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  Neurology was consulted and labs along with calcium channel antibodies were sent. -Follow-up lab results -Neurology thinks that it might be due to physical deconditioning. -PT/OT evaluation  Low cortisol level.  Might be some contributory adrenal crisis element.  As symptoms improved with addition of steroid.  Apparently she was discharged on Decadron from  [facility] and not sure when it was stopped. -Continue Cortef 10 mg in the morning and 5 mg in afternoon.  Concern of staring spells.  EEG was negative for any epileptiform  activity. Per neurology no need for any antiepileptic medications-can be considered if continue to have those spells.  Hypokalemia.  Magnesium at 1.7. -Replete magnesium and potassium and monitor.  Lactic acidosis.  Resolved.  Patient was also on Metformin and poor p.o. intake.  Type 2 diabetes mellitus with renal manifestations.  -Continue with Lantus and SSI  History of depression.  No acute concern. -Continue with Cymbalta.  Glaucoma. -Continue with home eyedrops.  Objective: Vitals:   08/22/20 0207 08/22/20 0616 08/22/20 0731 08/22/20 1148  BP: 132/82 133/77 125/82 (!) 142/89  Pulse: (!) 116 (!) 113 (!) 111 100  Resp: 18 18 20 18   Temp: 98.6 F (37 C) 98.7 F (37.1 C) 98.1 F (36.7 C) 98 F (36.7 C)  TempSrc:  Oral    SpO2: 93% 96% 97% 97%  Weight:      Height:        Intake/Output Summary (Last 24 hours) at 08/22/2020 1606 Last data filed at 08/22/2020 0436 Gross per 24 hour  Intake 2091.52 ml  Output 350 ml  Net 1741.52 ml   Filed Weights   08/19/20 1237  Weight: 79.4 kg    Examination:  General exam: Appears calm and comfortable  Respiratory system: Clear to auscultation. Respiratory effort normal. Cardiovascular system: S1 & S2 heard, RRR. Gastrointestinal system: Soft, nontender, nondistended, bowel sounds positive. Central nervous system: Alert and oriented.  3/5 bilateral lower extremities. Extremities: No edema, no cyanosis, pulses intact and symmetrical. Psychiatry: Judgement and insight appear normal.    DVT prophylaxis: SCDs, recent brain surgery Code Status: Full Family Communication: Discussed with daughter on phone, she was asking about suture removal which is due tomorrow. Disposition Plan:  Status is: Inpatient  Remains inpatient appropriate because:Inpatient level of care appropriate due to severity of illness   Dispo: The patient is from: SNF              Anticipated d/c is to: To be determined              Anticipated d/c date  is: 2 days              Patient currently is not medically stable to d/c.   Difficult to place patient No   Consultants:   ID  Neurology  Neuro surgery  Procedures:  Antimicrobials:  Ceftazidime Vancomycin  Data Reviewed: I have personally reviewed following labs and imaging studies  CBC: Recent Labs  Lab 08/19/20 1255 08/20/20 1014 08/21/20 0432 08/22/20 0553  WBC 10.3 8.0 13.2* 11.0*  NEUTROABS  --  6.7  --   --   HGB 10.8* 10.2* 10.2* 9.8*  HCT 33.2* 31.7* 31.0* 30.7*  MCV 86.7 87.1 85.6 86.7  PLT 345 292 288 962   Basic Metabolic Panel: Recent Labs  Lab 08/19/20 1255 08/20/20 1014 08/21/20 0432 08/22/20 0553  NA 138 138 140 140  K 2.8* 3.6 3.3* 2.9*  CL 99 103 106 107  CO2 24 23 22 22   GLUCOSE 218* 267* 213* 168*  BUN 28* 18 18 14   CREATININE 0.68 0.53 0.54 0.51  CALCIUM 9.3 8.8* 8.8* 8.5*  MG 1.8 1.7  --  1.7  PHOS  --  2.3*  --   --    GFR: Estimated Creatinine Clearance: 63.9 mL/min (by C-G formula based on  SCr of 0.51 mg/dL). Liver Function Tests: Recent Labs  Lab 08/19/20 1255 08/20/20 1014 08/22/20 0553  AST 19 16 18   ALT 11 12 12   ALKPHOS 56 56 54  BILITOT 1.2 0.9 0.7  PROT 6.9 6.7 5.9*  ALBUMIN 3.2* 3.1* 2.6*   No results for input(s): LIPASE, AMYLASE in the last 168 hours. Recent Labs  Lab 08/22/20 0553  AMMONIA 17   Coagulation Profile: Recent Labs  Lab 08/19/20 1255 08/21/20 0432  INR 1.0 1.1   Cardiac Enzymes: No results for input(s): CKTOTAL, CKMB, CKMBINDEX, TROPONINI in the last 168 hours. BNP (last 3 results) No results for input(s): PROBNP in the last 8760 hours. HbA1C: No results for input(s): HGBA1C in the last 72 hours. CBG: Recent Labs  Lab 08/21/20 1949 08/22/20 0027 08/22/20 0435 08/22/20 0730 08/22/20 1155  GLUCAP 227* 158* 139* 144* 242*   Lipid Profile: No results for input(s): CHOL, HDL, LDLCALC, TRIG, CHOLHDL, LDLDIRECT in the last 72 hours. Thyroid Function Tests: No results for  input(s): TSH, T4TOTAL, FREET4, T3FREE, THYROIDAB in the last 72 hours. Anemia Panel: No results for input(s): VITAMINB12, FOLATE, FERRITIN, TIBC, IRON, RETICCTPCT in the last 72 hours. Sepsis Labs: Recent Labs  Lab 08/20/20 0039 08/20/20 1014 08/21/20 0432 08/21/20 2226  PROCALCITON  --   --  21.14 21.59  LATICACIDVEN 2.5* 1.2  --   --     Recent Results (from the past 240 hour(s))  Urine Culture     Status: None   Collection Time: 08/19/20 11:46 PM   Specimen: Urine, Random  Result Value Ref Range Status   Specimen Description   Final    URINE, RANDOM Performed at [facility], [address], [place], [place] [address]    Special Requests   Final    NONE Performed at [facility], [address], [place], [place] [address]    Culture   Final    NO GROWTH Performed at [facility], [place] [address], [place], [place] [address]    Report Status 08/21/2020 FINAL  Final  SARS Coronavirus 2 by RT PCR (hospital order, performed in [facility] hospital lab) Nasopharyngeal Nasopharyngeal Swab     Status: None   Collection Time: 08/20/20 12:39 AM   Specimen: Nasopharyngeal Swab  Result Value Ref Range Status   SARS Coronavirus 2 NEGATIVE NEGATIVE Final    Comment: (NOTE) SARS-CoV-2 target nucleic acids are NOT DETECTED.  The SARS-CoV-2 RNA is generally detectable in upper and lower respiratory specimens during the acute phase of infection. The lowest concentration of SARS-CoV-2 viral copies this assay can detect is 250 copies / mL. A negative result does not preclude SARS-CoV-2 infection and should not be used as the sole basis for treatment or other patient management decisions.  A negative result may occur with improper specimen collection / handling, submission of specimen other than nasopharyngeal swab, presence of viral mutation(s) within the areas targeted by this assay, and inadequate number of viral copies (<250 copies / mL). A negative result  must be combined with clinical observations, patient history, and epidemiological information.  Fact Sheet for Patients:   StrictlyIdeas.no  Fact Sheet for Healthcare Providers: BankingDealers.co.za  This test is not yet approved or  cleared by the [place] FDA and has been authorized for detection and/or diagnosis of SARS-CoV-2 by FDA under an Emergency Use Authorization (EUA).  This EUA will remain in effect (meaning this test can be used) for the duration of the COVID-19 declaration  under Section 564(b)(1) of the Act, 21 U.S.C. section 360bbb-3(b)(1), unless the authorization is terminated or revoked sooner.  Performed at [facility], [address] [place], [place] [address]   Culture, blood (Routine X 2) w Reflex to ID Panel     Status: None (Preliminary result)   Collection Time: 08/20/20 12:39 AM   Specimen: BLOOD  Result Value Ref Range Status   Specimen Description BLOOD BLOOD RIGHT HAND  Final   Special Requests   Final    BOTTLES DRAWN AEROBIC AND ANAEROBIC Blood Culture results may not be optimal due to an excessive volume of blood received in culture bottles   Culture   Final    NO GROWTH 2 DAYS Performed at [facility], [address], [place], [place] [address]    Report Status PENDING  Incomplete  Culture, blood (Routine X 2) w Reflex to ID Panel     Status: None (Preliminary result)   Collection Time: 08/20/20 12:39 AM   Specimen: BLOOD  Result Value Ref Range Status   Specimen Description BLOOD BLOOD LEFT WRIST  Final   Special Requests   Final    BOTTLES DRAWN AEROBIC AND ANAEROBIC Blood Culture results may not be optimal due to an excessive volume of blood received in culture bottles   Culture   Final    NO GROWTH 2 DAYS Performed at [facility], [address], [place], [place] [address]    Report Status PENDING  Incomplete  MRSA PCR Screening     Status: None    Collection Time: 08/20/20 12:43 PM   Specimen: Nasopharyngeal  Result Value Ref Range Status   MRSA by PCR NEGATIVE NEGATIVE Final    Comment:        The GeneXpert MRSA Assay (FDA approved for NASAL specimens only), is one component of a comprehensive MRSA colonization surveillance program. It is not intended to diagnose MRSA infection nor to guide or monitor treatment for MRSA infections. Performed at [facility], [address] [place], [place] [address]   CSF culture     Status: None (Preliminary result)   Collection Time: 08/21/20  8:43 AM   Specimen: PATH Cytology CSF; Cerebrospinal Fluid  Result Value Ref Range Status   Specimen Description   Final    CSF Performed at [facility], [address], [place], [place] [address]    Special Requests   Final    NONE Performed at [facility], [address] [place], [place] [address]    Gram Stain   Final    NO ORGANISMS SEEN WBC SEEN RBC SEEN Performed at [facility], [address] [place], [place] [address]    Culture   Final    NO GROWTH < 24 HOURS Performed at [facility], [place] [address] [place], [place] [address]    Report Status PENDING  Incomplete  Culture, fungus without smear     Status: None (Preliminary result)   Collection Time: 08/21/20  8:43 AM   Specimen: PATH Cytology CSF; Cerebrospinal Fluid  Result Value Ref Range Status   Specimen Description   Final    CSF Performed at [facility], [address], [place], [place] [address]    Special Requests   Final    NONE Performed at [facility], [address], [place], [place] [address]    Culture   Final    NO FUNGUS ISOLATED AFTER 1 DAY Performed at [facility], [place] [address], [place], [place] [address]    Report Status PENDING  Incomplete     Radiology Studies: EEG  Result Date: 08/21/2020 Lora Havens, MD     08/21/2020  5:53 PM Patient Name: Cheyenne Anthony  MRN: [id number] Epilepsy Attending: Lora Havens Referring Physician/Provider: Dr Donnetta Simpers Date: 08/21/2020 Duration: 20.59 mins Patient history: 74yo F with ams. EEG to evaluate for seizure Level of alertness: Awake AEDs during EEG study: None Technical aspects: This EEG study was done with scalp electrodes positioned according to the 10-20 International system of electrode placement. Electrical activity was acquired at a sampling rate of 500Hz  and reviewed with a high frequency filter of 70Hz  and a low frequency filter of 1Hz . EEG data were recorded continuously and digitally stored. Description: The posterior dominant rhythm consists of 8-9 Hz activity of moderate voltage (25-35 uV) seen predominantly in posterior head regions, symmetric and reactive to eye opening and eye closing. Hyperventilation and photic stimulation were not performed.   IMPRESSION: This study is within normal limits. No seizures or epileptiform discharges were seen throughout the recording. [place]   DG FLUORO GUIDED LOC OF NEEDLE/CATH TIP FOR SPINAL INJECT LT  Result Date: 08/21/2020 CLINICAL DATA:  Encephalopathy EXAM: DIAGNOSTIC LUMBAR PUNCTURE UNDER FLUOROSCOPIC GUIDANCE FLUOROSCOPY TIME:  Fluoroscopy Time:  0.2 minute Radiation Exposure Index (if provided by the fluoroscopic device): 1.1 mGy Number of Acquired Spot Images: 0 PROCEDURE: Informed consent was obtained from the patient prior to the procedure, including potential complications of headache, allergy, and pain. With the patient prone, the lower back was prepped with Betadine. 1% Lidocaine was used for local anesthesia. Lumbar puncture was performed at the L2-3 level using a 22 gauge needle with return of blood tinged CSF with progressive clearing 2 clear CSF. 8 ml of CSF were obtained for laboratory studies. The patient tolerated the procedure well and there were no apparent complications. IMPRESSION: Successful fluoroscopic guided lumbar puncture.  Electronically Signed   By: Kathreen Devoid   On: 08/21/2020 09:18    Scheduled Meds: . DULoxetine  60 mg Oral Daily  . hydrocortisone  10 mg Oral Daily  . hydrocortisone  5 mg Oral Daily  . insulin aspart  0-15 Units Subcutaneous Q4H  . insulin glargine  5 Units Subcutaneous QHS  . latanoprost  1 drop Both Eyes QHS  . metoprolol succinate  50 mg Oral Daily   Continuous Infusions: . sodium chloride 75 mL/hr (08/22/20 0855)  . cefTAZidime (FORTAZ)  IV 2 g (08/22/20 1413)  . vancomycin 1,000 mg (08/22/20 1251)     LOS: 2 days   Time spent: 40 minutes.  Lorella Nimrod, MD [facility]  If 7PM-7AM, please contact night-coverage [email or web address]  08/22/2020, 4:06 PM   This record has been created using Systems analyst. Errors have been sought and corrected,but may not always be located. Such creation errors do not reflect on the standard of care.

## 2020-08-22 NOTE — Plan of Care (Signed)
New care plan initiated 

## 2020-08-22 NOTE — Progress Notes (Signed)
Pharmacy Antibiotic Note  Cheyenne Anthony is a 75 y.o. 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 She initiated antibiotic treatment with vancomycin and ceftriaxone 1/23, which was broadened to cefepime then changed to ceftazidime to prevent further altered mental status. LP on 1/24 showed RBC 346 and 527, WBC 27 and 39 mostly lymphs, protein 63, glucose 121. No growth to date on CSF Cx. Patient's mental status has improved, she is afebrile, and her WBC decreased to 11.0 on Day 3 of antibiotics. 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  Pharmacy has been consulted for vancomycin dosing.   - Vancomycin trough: 16 mcg/mL drawn 1/25 @ 11:30 - Vancomycin 1/25 doses: 00:22 and 12:51 - Trough drawn appropriately and is at 15-20 mcg/mL goal. - Renal function is stable.  Plan: - Continue vancomycin 1000mg  q12hr. - Continue ceftazidime 2g q8hr. - Reassess appropriateness for antibiotic de-escalation. - Monitor creatinine and WBC.  Height: 5\' 4"  (162.6 cm) Weight: 79.4 kg (175 lb) IBW/kg (Calculated) : 54.7  Temp (24hrs), Avg:98.4 F (36.9 C), Min:98 F (36.7 C), Max:98.7 F (37.1 C)  Recent Labs  Lab 08/19/20 1255 08/20/20 0039 08/20/20 1014 08/21/20 0432 08/22/20 0553 08/22/20 1130  WBC 10.3  --  8.0 13.2* 11.0*  --   CREATININE 0.68  --  0.53 0.54 0.51  --   LATICACIDVEN  --  2.5* 1.2  --   --   --   VANCOTROUGH  --   --   --   --   --  16    Estimated Creatinine Clearance: 63.9 mL/min (by C-G formula based on SCr of 0.51 mg/dL).    No Known Allergies  Antimicrobials this admission: ceftriaxone 1/23 >> 1/24 cefepime x1 1/24 ceftazidime 1/25 >> vancomycin 1/23 >>  Microbiology results: 1/22 UCx: no growth 1/23 BCx 2 sets: no growth 2 days 1/23 MRSA PCR: negative 1/23 SARS-CoV-2 PCR: negative 1/24 CSF Fungus Cx: no growth 1/24 CSF Cx: no growth  Thank  you for allowing pharmacy to be a part of this patient's care.  Alden Benjamin 08/22/2020 12:48 PM

## 2020-08-22 NOTE — Progress Notes (Signed)
ID Pt doing much better Awake and alert and oriented X 5 Says weakness legs better Patient Vitals for the past 24 hrs:  BP Temp Temp src Pulse Resp SpO2  08/22/20 1630 125/88 98.8 F (37.1 C) - (!) 102 20 97 %  08/22/20 1148 (!) 142/89 98 F (36.7 C) - 100 18 97 %  08/22/20 0731 125/82 98.1 F (36.7 C) - (!) 111 20 97 %  08/22/20 0616 133/77 98.7 F (37.1 C) Oral (!) 113 18 96 %  08/22/20 0207 132/82 98.6 F (37 C) - (!) 116 18 93 %  08/22/20 0032 119/76 98.5 F (36.9 C) - (!) 101 18 94 %  08/21/20 2003 131/89 - - (!) 109 16 97 %   No neck rigidity Eyes full range of movt Sutures on the rt parietal area intact- surgical site without any erythema or discharge Chest b/l air entry HS irregular Abd soft CNS moves upper extremities much better than lower Edema legs  Labs CBC Latest Ref Rng & Units 08/22/2020 08/21/2020 08/20/2020  WBC 4.0 - 10.5 K/uL 11.0(H) 13.2(H) 8.0  Hemoglobin 12.0 - 15.0 g/dL 9.8(L) 10.2(L) 10.2(L)  Hematocrit 36.0 - 46.0 % 30.7(L) 31.0(L) 31.7(L)  Platelets 150 - 400 K/uL 252 288 292    CMP Latest Ref Rng & Units 08/22/2020 08/21/2020 08/20/2020  Glucose 70 - 99 mg/dL 168(H) 213(H) 267(H)  BUN 8 - 23 mg/dL 14 18 18   Creatinine 0.44 - 1.00 mg/dL 0.51 0.54 0.53  Sodium 135 - 145 mmol/L 140 140 138  Potassium 3.5 - 5.1 mmol/L 2.9(L) 3.3(L) 3.6  Chloride 98 - 111 mmol/L 107 106 103  CO2 22 - 32 mmol/L 22 22 23   Calcium 8.9 - 10.3 mg/dL 8.5(L) 8.8(L) 8.8(L)  Total Protein 6.5 - 8.1 g/dL 5.9(L) - 6.7  Total Bilirubin 0.3 - 1.2 mg/dL 0.7 - 0.9  Alkaline Phos 38 - 126 U/L 54 - 56  AST 15 - 41 U/L 18 - 16  ALT 0 - 44 U/L 12 - 12   CSF culture no growth so far BC NG Impression/recommendation  Encephalopathy- likely low cortisol /adrenal crisis was playing a role Initially  nosocomial meningitis post surgery was a concern but csf findings did not favor it and pt has responded very swiftly . 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  Not sure when it was tapered and stopped.  This could clearly explain her altered mental status weakness and fever.  she is now on physiologic dose steroid ( not stress dose) and seems to be better. 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

## 2020-08-22 NOTE — Progress Notes (Signed)
   08/22/20 0207  Assess: MEWS Score  Temp 98.6 F (37 C)  BP 132/82  Pulse Rate (!) 116  Resp 18  SpO2 93 %  O2 Device Room Air  Assess: MEWS Score  MEWS Temp 0  MEWS Systolic 0  MEWS Pulse 2  MEWS RR 0  MEWS LOC 0  MEWS Score 2  MEWS Score Color Yellow  Assess: if the MEWS score is Yellow or Red  Were vital signs taken at a resting state? Yes  Focused Assessment No change from prior assessment  Early Detection of Sepsis Score *See Row Information* Low  MEWS guidelines implemented *See Row Information* Yes  Treat  MEWS Interventions Escalated (See documentation below)  Pain Scale 0-10  Take Vital Signs  Increase Vital Sign Frequency  Yellow: Q 2hr X 2 then Q 4hr X 2, if remains yellow, continue Q 4hrs  Escalate  MEWS: Escalate Yellow: discuss with charge nurse/RN and consider discussing with provider and RRT  Notify: Charge Nurse/RN  Name of Charge Nurse/RN Notified Linise RN  Date Charge Nurse/RN Notified 08/22/20  Time Charge Nurse/RN Notified 0222  Notify: Provider  Provider Name/Title Jonny Ruiz NP  Date Provider Notified 08/22/20  Time Provider Notified 0222  Notification Type  (secure chat)  Notification Reason Other (Comment) (Elevated heart rate)  Response No new orders;Other (Comment) (Will treat if heart rate goes above 120bpm.)  Date of Provider Response 08/22/20  Time of Provider Response 0231  Document  Patient Outcome Other (Comment) (No intervention necessary at this time. Pt is asymptomatic.Marland Kitchen)  Progress note created (see row info) Yes

## 2020-08-22 NOTE — Plan of Care (Signed)

## 2020-08-23 LAB — BASIC METABOLIC PANEL
Anion gap: 11 (ref 5–15)
BUN: 10 mg/dL (ref 8–23)
CO2: 22 mmol/L (ref 22–32)
Calcium: 9 mg/dL (ref 8.9–10.3)
Chloride: 111 mmol/L (ref 98–111)
Creatinine, Ser: 0.49 mg/dL (ref 0.44–1.00)
GFR, Estimated: >60 mL/min (ref >60)
Glucose, Bld: 136 mg/dL — ABNORMAL HIGH (ref 70–99)
Potassium: 3.6 mmol/L (ref 3.5–5.1)
Sodium: 144 mmol/L (ref 135–145)

## 2020-08-23 LAB — CBC
HCT: 30.8 % — ABNORMAL LOW (ref 36.0–46.0)
Hemoglobin: 9.8 g/dL — ABNORMAL LOW (ref 12.0–15.0)
MCH: 27.7 pg (ref 26.0–34.0)
MCHC: 31.8 g/dL (ref 30.0–36.0)
MCV: 87 fL (ref 80.0–100.0)
Platelets: 237 10*3/uL (ref 150–400)
RBC: 3.54 MIL/uL — ABNORMAL LOW (ref 3.87–5.11)
RDW: 15.7 % — ABNORMAL HIGH (ref 11.5–15.5)
WBC: 9.4 10*3/uL (ref 4.0–10.5)
nRBC: 0 % (ref 0.0–0.2)

## 2020-08-23 LAB — OLIGOCLONAL BANDS, CSF + SERM

## 2020-08-23 LAB — GLUCOSE, CAPILLARY
Glucose-Capillary: 133 mg/dL — ABNORMAL HIGH (ref 70–99)
Glucose-Capillary: 136 mg/dL — ABNORMAL HIGH (ref 70–99)
Glucose-Capillary: 152 mg/dL — ABNORMAL HIGH (ref 70–99)
Glucose-Capillary: 148 mg/dL — ABNORMAL HIGH (ref 70–99)
Glucose-Capillary: 102 mg/dL — ABNORMAL HIGH (ref 70–99)

## 2020-08-23 MED ORDER — INSULIN ASPART 100 UNIT/ML ~~LOC~~ SOLN
0.0000 [IU] | Freq: Three times a day (TID) | SUBCUTANEOUS | Status: DC
  Administered 2020-08-23: 3 [IU] via SUBCUTANEOUS
  Administered 2020-08-23 – 2020-08-25 (×4): 2 [IU] via SUBCUTANEOUS
  Filled 2020-08-23 (×5): qty 1

## 2020-08-23 MED ORDER — INSULIN ASPART 100 UNIT/ML ~~LOC~~ SOLN: 0.0000 [IU] | Freq: Every day | SUBCUTANEOUS | Status: DC

## 2020-08-23 MED ORDER — INSULIN ASPART 100 UNIT/ML ~~LOC~~ SOLN
SUBCUTANEOUS | Status: AC
  Filled 2020-08-23: qty 1

## 2020-08-23 NOTE — TOC Progression Note (Signed)
Transition of Care [facility]) - Progression Note    Patient Details  Name: Cheyenne Anthony MRN: [id number] Date of Birth: March 16, 1946  Transition [facility]) CM/SW [place], RN Phone Number: 08/23/2020, 2:14 PM  Clinical Narrative:   RNCM received communication from patient's [facility] social worker Trudy. Per Aram Beecham patient's family has been very interested in moving her out of [facility] but at the current time the other 2 [facility] contracted SNFs do not have any availability.   RNCM placed call to patient's daughter [name] to discuss discharge planning and recommendations for SNF. Discussed with [name] that patient is currently saying she wants to come home with her daughter but that she is also still having some issues with cognition.   Daughter [name] is verbalizing some concerns that she does not think patient is ready to leave the hospital. Discussed that if patient is going to SNF the facility will have to be determined first so patient will not leave today. Daughter [name] verbalizes that she will get in touch with her sister to discuss and then will let this RNCM know.          Expected Discharge Plan and Services                                                 Social Determinants of Health (SDOH) Interventions    Readmission Risk Interventions No flowsheet data found.

## 2020-08-23 NOTE — Evaluation (Addendum)
Occupational Therapy Evaluation Patient Details Name: Cheyenne Anthony MRN: [id number] DOB: February 18, 1946 Today's Date: 08/23/2020    History of Present Illness Pt is a 75yo F admitted to [facility] on 08/19/20 for daughters c/o  "not acting right" and mild fever of 99.6deg. Pt with recent hospitalization for AMS due to R cerebellar lesion, with brainstem compression and hyrocephalus. Pt admitted to neurosurgical ICU at [facility] and underwent EVD and suboccipital craniotomy for resection of R cerebellar tumor (08/02/20) and secondary suboccipital craniotomy for resection of residual tumor (08/04/20). 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 PMH significant for: DM, HTN, Depression, HLD, knee arthritis, lung CA, cerebellar brain metastases s/p suboccipital craniotomy on 1/5. EEG performed on 1/24 - study WNL.   Clinical Impression   Cheyenne Anthony was seen for OT evaluation this date. Prior to hospital admission, pt was receiving care in a STR facility. Per chart/caregivers pt function began to decline while in STR. She has had multiple hospital admissions in the past month. Per chart, pt was MOD I for functional mobility using a RW prior to her initial brain surgery at the start of January. Pt was living with her daughter in a 1-level apartment home and was MOD I for ADL management prior to initial surgery. Pt is an active PACE participant. Currently pt demonstrates impairments as described below (See OT problem list) which functionally limit her ability to perform ADL/self-care tasks. Pt currently requires Mod-Max +2 for bed/functional mobility. She is able to perform STS from EOB with MAX +2 and maintains standing for ~30 seconds during bed linen change. She is able to perform UB ADL management at bed-level with set-up/supervision assist including self feeding and drinking. Pt  cognition limited t/o session. She requires multimodal cueing to follow single step commands, but with increased time and cueing she is able to follow all prompts appropriately during session. She is pleasant and motivated to participate t/o session. Pt oriented to limited place and self only during session.  Pt would benefit from skilled OT services to address noted impairments and functional limitations (see below for any additional details) in order to maximize safety and independence while minimizing falls risk and caregiver burden. Upon hospital discharge, recommend STR to maximize pt safety and return to PLOF.      Follow Up Recommendations  SNF;Supervision/Assistance - 24 hour    Equipment Recommendations   (TBD)    Recommendations for Other Services       Precautions / Restrictions Precautions Precautions: Fall Precaution Comments: recent craniotomy 1/5 and 1/7 Restrictions Weight Bearing Restrictions: No      Mobility Bed Mobility Overal bed mobility: Needs Assistance Bed Mobility: Sit to Supine;Supine to Sit;Rolling Rolling: Mod assist   Supine to sit: +2 for physical assistance;HOB elevated;Mod assist Sit to supine: Total assist;+2 for physical assistance        Transfers Overall transfer level: Needs assistance   Transfers: Sit to/from Stand Sit to Stand: Max assist;+2 physical assistance         General transfer comment: Pt able to perform STS from bed in lowest position with +2 MAX A. she is able to maintain standing for ~30 sec during sheet change using RW for BUE support.    Balance Overall balance assessment: Needs assistance Sitting-balance support: Bilateral upper extremity supported;Feet supported Sitting balance-Leahy Scale: Fair Sitting balance - Comments: Variable assist required for sitting balance Supervision -  MIN A to maintain upright posture at EOB 2/2 posterior lean intermittently. Postural control: Posterior lean Standing balance support:  Bilateral upper extremity supported;During functional activity Standing balance-Leahy Scale: Zero Standing balance comment: Max assist +2 to stand with BUE support in RW                           ADL either performed or assessed with clinical judgement   ADL Overall ADL's : Needs assistance/impaired Eating/Feeding: Set up;Supervision/ safety;Bed level;Sitting Eating/Feeding Details (indicate cue type and reason): Pt noted to spit out parts of a honey dew melon from her tray. She states she is unable to chew them. Grooming: Sitting;Minimal assistance;Moderate assistance                   Toilet Transfer: RW;+2 for physical assistance;Maximal assistance;BSC   Toileting- Clothing Manipulation and Hygiene: +2 for physical assistance;Maximal assistance;Sit to/from stand       Functional mobility during ADLs: Rolling walker;Maximal assistance;+2 for physical assistance       Vision Baseline Vision/History: Wears glasses Wears Glasses: At all times Patient Visual Report: No change from baseline Additional Comments: Pt denies functional changes to vision. Appears to visually track appropriately and can identify objects outside of her room window with glasses on.     Perception     Praxis      Pertinent Vitals/Pain Pain Assessment: Faces Faces Pain Scale: Hurts little more Pain Location: R knee with mobility Pain Descriptors / Indicators: Aching ("Puffy") Pain Intervention(s): Monitored during session;Repositioned;Limited activity within patient's tolerance     Hand Dominance Right   Extremity/Trunk Assessment Upper Extremity Assessment Upper Extremity Assessment: Generalized weakness (Grip/FMC grossly WFL. Pt is able to hold a cup for drinking and self-feed from meal tray during session w/o assist. Pt denies sensory deficits on either side. Per PT note, decreased ability to differentiate R/L sides of her body earlier this date.)   Lower Extremity  Assessment Lower Extremity Assessment: Generalized weakness;Defer to PT evaluation       Communication Communication Communication: No difficulties   Cognition Arousal/Alertness: Awake/alert Behavior During Therapy: WFL for tasks assessed/performed Overall Cognitive Status: No family/caregiver present to determine baseline cognitive functioning Area of Impairment: Orientation;Memory;Safety/judgement;Awareness;Problem solving                 Orientation Level: Disoriented to;Time;Situation (Requires increased time to provide DOB)     Following Commands: Follows one step commands inconsistently;Follows one step commands with increased time Safety/Judgement: Decreased awareness of safety;Decreased awareness of deficits Awareness: Intellectual Problem Solving: Slow processing;Decreased initiation;Difficulty sequencing;Requires verbal cues     General Comments       Exercises Other Exercises Other Exercises: Pt able to participate in bed mobility and transfes with max-total assist +2. Due to impaired cognition, pt required max multimodal cues for sequencing/safety. Further mobility limited secondary to fatigue, weakness, and confusion. Other Exercises: Pt educated on role of OT in acute setting, bed mobility techniques, falls prevention strategies, and safety/self-care skills. Pt education limited 2/2 impaired cognition.   Shoulder Instructions      Home Living Family/patient expects to be discharged to:: Skilled nursing facility (c LRAD PRN for improved safety and functional indep. Is PACE participant.) Living Arrangements: Children Available Help at Discharge: [place];Available 24 hours/day;Family                         Home Equipment: Tub bench;Walker - 4 wheels;Cane -  single point;Transport chair;Toilet riser;Grab bars - tub/shower;Bedside commode          Prior Functioning/Environment      ADL's / Homemaking Assistance Needed: Prior to  previous hospitalization/sx, pt was Ind with ADL's.            OT Problem List: Decreased strength;Decreased range of motion;Decreased activity tolerance;Decreased cognition;Decreased safety awareness      OT Treatment/Interventions: Self-care/ADL training;Therapeutic exercise;Energy conservation;DME and/or AE instruction;Therapeutic activities;Patient/family education;Balance training    OT Goals(Current goals can be found in the care plan section) Acute Rehab OT Goals Patient Stated Goal: To go home with my daughter OT Goal Formulation: With patient Time For Goal Achievement: 09/06/20 Potential to Achieve Goals: Fair ADL Goals Pt Will Perform Grooming: sitting;with set-up;with supervision (c LRAD PRN for improved safety and functional indep.) Pt Will Perform Lower Body Dressing: sit to/from stand;with set-up;with supervision (c LRAD PRN for improved safety and functional indep.) Pt Will Transfer to Toilet: with +2 assist;with min assist;bedside commode;stand pivot transfer (c LRAD PRN for improved safety and functional indep.) Pt Will Perform Toileting - Clothing Manipulation and hygiene: sit to/from stand;with supervision;with set-up (c LRAD PRN for improved safety and functional indep.)  OT Frequency: Min 1X/week   Barriers to D/C: Inaccessible home environment;Decreased caregiver support          Co-evaluation              AM-PAC OT "6 Clicks" Daily Activity     Outcome Measure Help from another person eating meals?: A Little Help from another person taking care of personal grooming?: A Little Help from another person toileting, which includes using toliet, bedpan, or urinal?: A Lot Help from another person bathing (including washing, rinsing, drying)?: A Lot Help from another person to put on and taking off regular upper body clothing?: A Little Help from another person to put on and taking off regular lower body clothing?: A Lot 6 Click Score: 15   End of Session  Equipment Utilized During Treatment: Gait belt;Rolling walker  Activity Tolerance: Patient tolerated treatment well Patient left: in bed;with call bell/phone within reach;with bed alarm set (With student nsg present in room.)  OT Visit Diagnosis: Other abnormalities of gait and mobility (R26.89)                Time: 1950-9326 OT Time Calculation (min): 28 min Charges:  OT General Charges $OT Visit: 1 Visit OT Evaluation $OT Eval Moderate Complexity: 1 Mod OT Treatments $Self Care/Home Management : 23-37 mins  Shara Blazing, M.S., OTR/L Ascom: [phone] 08/23/20, 3:59 PM

## 2020-08-23 NOTE — Evaluation (Signed)
Physical Therapy Evaluation Patient Details Name: Cheyenne Anthony MRN: [id number] DOB: [birth year] Today's Date: 08/23/2020   History of Present Illness  Pt is a 75yo F admitted to [facility] on 08/19/20 for daughters c/o  "not acting right" and mild fever of 99.6deg. Pt with recent hospitalization for AMS due to R cerebellar lesion, with brainstem compression and hyrocephalus. Pt admitted to neurosurgical ICU at [facility] and underwent EVD and suboccipital craniotomy for resection of R cerebellar tumor (08/02/20) and secondary suboccipital craniotomy for resection of residual tumor (08/04/20). 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 PMH significant for: DM, HTN, Depression, HLD, knee arthritis, lung CA, cerebellar brain metastases s/p suboccipital craniotomy on 1/5. EEG performed on 1/24 - study WNL.    Clinical Impression  Pt is a 75 year old F admitted to hospital on 08/19/20 for AMS; pt s/p craniotomy and cerebellar tumor resection on 1/5 and 1/7. Pt questionable historian of health as she was only A&O x2; per chart review pt from SNF and was WC bound. Prior to hospitalization in Dec-Jan and recent sx, pt used rollator for ambulation and was Ind with ADL's. Pt presents with confusion, altered sensation, generalized weakness, grossly poor balance, decreased insight into deficits, poor safety awareness, decreased activity tolerance, and impaired coordination and motor planning, resulting in impaired functional mobility. Pt with inability to differentiate R vs. L side of body, despite orientation and max cueing. Due to deficits, pt required max - total assist +2 for bed mobility, and max assist +2 for STS transfer with RW. Further mobility limited due to poor activity tolerance, balance, and safety awareness. Deficits limit the pt's ability to safely and independently  perform ADL's, transfer, and ambulate. Pt will benefit from acute skilled PT services to address deficits for return to baseline function. At this time, PT recommends SNF at DC to address deficits and improve overall safety with functional mobility for return to baseline function.     Follow Up Recommendations SNF    Equipment Recommendations   (defer to postacute)    Recommendations for Other Services       Precautions / Restrictions Precautions Precautions: Fall Precaution Comments: recent craniotomy 1/5 and 1/7 Restrictions Weight Bearing Restrictions: No      Mobility  Bed Mobility Overal bed mobility: Needs Assistance Bed Mobility: Rolling;Sit to Supine;Supine to Sit Rolling: Max assist;+2 for physical assistance   Supine to sit: Max assist;+2 for physical assistance;HOB elevated Sit to supine: Total assist;+2 for physical assistance   General bed mobility comments: Max - total assist +2 for all bed mobility; increased cues required for sequencing/safety.    Transfers Overall transfer level: Needs assistance   Transfers: Sit to/from Stand Sit to Stand: Max assist;+2 physical assistance;From elevated surface         General transfer comment: Max assist +2 to perform STS transfer from elevated surface with RW; max multimodal cues for sequencing and positioning. Pt only able to maintain static standing for ~5sec, demonstrating wide BOS, forward flexed posture, and downward gaze.  Ambulation/Gait             General Gait Details: Unable to assess due to poor standing tolerance     Balance Overall balance assessment: Needs assistance Sitting-balance support: Bilateral upper extremity supported;Feet supported Sitting balance-Leahy Scale: Poor Sitting balance - Comments: Intermittently required min guard - max assist for maintenance of upright posture. x1  posterior LOB, requiring total assist for correction. Able to tolerate ~19min seated at EOB before reports of  fatigue, and increased assistance levels Postural control: Posterior lean Standing balance support: Bilateral upper extremity supported;During functional activity Standing balance-Leahy Scale: Zero Standing balance comment: Max assist +2 to stand with BUE support in RW                             Pertinent Vitals/Pain Pain Assessment: Faces Faces Pain Scale: Hurts little more Pain Location: R knee with mobility Pain Descriptors / Indicators: Aching Pain Intervention(s): Monitored during session;Repositioned    Home Living Family/patient expects to be discharged to:: Skilled nursing facility (Pt admitted to [facility] from SNF, but previously lived with daughter in one level home with level entry.)   Available Help at Discharge: [place];Available 24 hours/day                  Prior Function Level of Independence: Needs assistance   Gait / Transfers Assistance Needed: Pt questionable historian of health as she was only A&O x2. Per chart review, pt has been WC bound since last hospital admission in Dec-Jan. Prior to recent hospitalization/sx, pt was ambulating with rollator.  ADL's / Homemaking Assistance Needed: Prior to previous hospitalization/sx, pt was Ind with ADL's.        Hand Dominance        Extremity/Trunk Assessment   Upper Extremity Assessment Upper Extremity Assessment: Generalized weakness;Defer to OT evaluation LUE Deficits / Details: hyperesthesia; unable to differentiate R vs. L side of body    Lower Extremity Assessment Lower Extremity Assessment: Generalized weakness;RLE deficits/detail;LLE deficits/detail RLE Deficits / Details: Grossly 2/5 with exception of DF at 3/5 and PF at 3+/5; incoordination, decreased motor planning/grading LLE Deficits / Details: Grossly 2/5 with exception of DF at 3/5 and PF at 3+/5; incoordination, diminished sensation at L3, decreased motor planning/grading.    Cervical / Trunk  Assessment Cervical / Trunk Assessment: Normal  Communication      Cognition Arousal/Alertness: Awake/alert Behavior During Therapy: WFL for tasks assessed/performed Overall Cognitive Status: No family/caregiver present to determine baseline cognitive functioning Area of Impairment: Orientation;Memory;Safety/judgement;Awareness;Problem solving                 Orientation Level: Disoriented to;Time;Situation   Memory: Decreased recall of precautions;Decreased short-term memory Following Commands: Follows one step commands inconsistently;Follows one step commands with increased time Safety/Judgement: Decreased awareness of safety;Decreased awareness of deficits Awareness: Intellectual Problem Solving: Slow processing;Decreased initiation;Difficulty sequencing;Requires verbal cues General Comments: Pt oriented to person/DOB (not age), location, and month; unable to state situation or year without cues.      General Comments General comments (skin integrity, edema, etc.): PT to assist student RN with pt bathing and linen change during session    Exercises Other Exercises Other Exercises: Pt educated pt regarding: PT role/POC, DC recommendations, safety with mobility, s/s of increased ICP (dizziness, HA, changes in vision). Other Exercises: Pt able to participate in bed mobility and transfes with max-total assist +2. Due to impaired cognition, pt required max multimodal cues for sequencing/safety. Further mobility limited secondary to fatigue, weakness, and confusion.   Assessment/Plan    PT Assessment Patient needs continued PT services  PT Problem List Decreased strength;Decreased coordination;Decreased range of motion;Decreased cognition;Decreased activity tolerance;Decreased knowledge of use of DME;Decreased balance;Decreased safety awareness;Obesity;Decreased mobility;Decreased knowledge of precautions       PT Treatment Interventions DME instruction;Balance training;Gait  training;Neuromuscular re-education;Cognitive  remediation;Functional mobility training;Patient/family education;Therapeutic activities;Therapeutic exercise    PT Goals (Current goals can be found in the Care Plan section)  Acute Rehab PT Goals PT Goal Formulation: Patient unable to participate in goal setting    Frequency 7X/week   Barriers to discharge Inaccessible home environment;Decreased caregiver support Pt is currently non-ambulatory and dependent for care. Daughter works full time and is primary caregiver.    Co-evaluation               AM-PAC PT "6 Clicks" Mobility  Outcome Measure Help needed turning from your back to your side while in a flat bed without using bedrails?: A Lot Help needed moving from lying on your back to sitting on the side of a flat bed without using bedrails?: A Lot Help needed moving to and from a bed to a chair (including a wheelchair)?: Total Help needed standing up from a chair using your arms (e.g., wheelchair or bedside chair)?: A Lot Help needed to walk in hospital room?: Total Help needed climbing 3-5 steps with a railing? : Total 6 Click Score: 9    End of Session Equipment Utilized During Treatment: Gait belt Activity Tolerance: Patient tolerated treatment well;Patient limited by lethargy Patient left: in bed;with call bell/phone within reach;with bed alarm set;with nursing/sitter in room;with SCD's reapplied (HOB at 21deg) Nurse Communication: Mobility status PT Visit Diagnosis: Muscle weakness (generalized) (M62.81);Unsteadiness on feet (R26.81);Difficulty in walking, not elsewhere classified (R26.2);Other symptoms and signs involving the nervous system (H74.142)    Time: 3953-2023 PT Time Calculation (min) (ACUTE ONLY): 33 min   Charges:   PT Evaluation $PT Eval Moderate Complexity: 1 Mod PT Treatments $Therapeutic Activity: 8-22 mins       Herminio Commons, PT, DPT 9:55 AM,08/23/20

## 2020-08-23 NOTE — Progress Notes (Signed)
PROGRESS NOTE    Cheyenne Anthony  WCB:[id number] DOB: 02-27-1946 DOA: 08/19/2020 PCP: [place]   Brief Narrative: Taken from prior notes. 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  In the emergency room, noted to have a rectal temperature of 100.6 and MRI noted a small acute to subacute ischemic infarct along margins of resection cavity with small 2 to 3 cm subdural hematoma over the left occipital condyle with no appreciable residual or local recurrent tumor.  Neurosurgery consulted who felt that findings appear to be more in line with patient's recent surgical procedure and recommended neurology consult and EEG.  Admitted to the hospitalist service.  Patient noted to have elevated lactic acid level on admission so started on broad-spectrum antibiotics as concerns for meningitis.  Following admission, patient much more alert and interactive, looking to be back to baseline by day 2.  Seen by neurology and after cleared by neurosurgery, LP done on 1/24 by fluoroscopy.  CSF with high RBC and white cells, lymphocytic predominant, increased protein, cultures remain negative, more consistent with reactive with recent surgery.  EEG was done and it was negative for any seizure-like activity.  There was some concern of staring spells.  Neurology does not want to start any antiseizure medications and can consider if she continued to have more spells.  No more spells noted.  There was some concern of paraneoplastic syndrome/Lambert-Eaton syndrome due to bilateral lower extremity weakness, paraneoplastic labs along with calcium channel antibodies were sent by neurology.  According to neurology note it seems more like  deconditioning.  Mentation improved and she appears to be at her baseline now.  Subjective: Patient has no new complaint today. Received a call from [facility] program regarding for update, and they were also requesting removal of sutures as they were supposed to come off today.  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  Resolved. Patient had abnormal CT head findings with history of metastatic lung cancer with cerebellar mets s/p post craniotomy. CSF cultures were negative.  Elevated white cell count with lymphocytic predominance was more consistent with reactive and her history of malignancy. She was initially started on ceftriaxone and vancomycin and later transitioned to ceftazidime and vancomycin.  ID was consulted. -Antibiotics were discontinued yesterday. -No obvious source of infection so sepsis ruled out.  Bilateral lower extremity weakness.  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  Neurology was consulted and labs along with calcium channel antibodies were sent. -Follow-up lab results -Neurology thinks that it might be due to physical deconditioning. -PT/OT evaluation-recommending SNF placement. -TOC is working on it.  Low cortisol level.  Might be contributory adrenal crisis element.  As symptoms improved with addition of steroid.  Apparently she was discharged on Decadron with a tapering dose for 5 days from [place].  According to pace representative she  did got dose tapering doses. -Continue Cortef 10 mg in the morning and 5 mg in afternoon.  Concern of staring  spells.  EEG was negative for any epileptiform activity. Per neurology no need for any antiepileptic medications-can be considered if continue to have those spells.  Hypokalemia.  Resolved -Replete magnesium and potassium as needed and monitor.  Lactic acidosis.  Resolved.  Patient was also on Metformin and poor p.o. intake.  Type 2 diabetes mellitus with renal manifestations.  -Continue with Lantus and SSI  History of depression.  No acute concern. -Continue with Cymbalta.  Glaucoma. -Continue with home eyedrops.  Objective: Vitals:   08/23/20 0744 08/23/20 0934 08/23/20 1139 08/23/20 1329  BP: (!) 140/100  (!) 159/112 (!) 152/93  Pulse: (!) 105 (!) 110 97 92  Resp: 16  17 16   Temp: 98.2 F (36.8 C)  98.3 F (36.8 C) 98.7 F (37.1 C)  TempSrc: Oral  Oral Oral  SpO2: 96%  100%   Weight:      Height:        Intake/Output Summary (Last 24 hours) at 08/23/2020 1353 Last data filed at 08/23/2020 1740 Gross per 24 hour  Intake 120 ml  Output 250 ml  Net -130 ml   Filed Weights   08/19/20 1237  Weight: 79.4 kg    Examination:  General.  Pleasant elderly lady, in no acute distress. Pulmonary.  Lungs clear bilaterally, normal respiratory effort. CV.  Regular rate and rhythm, no JVD, rub or murmur. Abdomen.  Soft, nontender, nondistended, BS positive. CNS.  Alert and oriented x3.  No focal neurologic deficit, bilateral LE weakness. Extremities.  No edema, no cyanosis, pulses intact and symmetrical. Psychiatry.  Judgment and insight appears normal.   DVT prophylaxis: SCDs, recent brain surgery Code Status: Full Family Communication: Discussed with daughter on phone.  Disposition Plan:  Status is: Inpatient  Remains inpatient appropriate because:Inpatient level of care appropriate due to severity of illness   Dispo: The patient is from: SNF              Anticipated d/c is to: To be determined              Anticipated d/c date is: 2 days              Patient  currently is not medically stable to d/c.   Difficult to place patient No   Consultants:   ID  Neurology  Neuro surgery  Procedures:  Antimicrobials:   Data Reviewed: I have personally reviewed following labs and imaging studies  CBC: Recent Labs  Lab 08/19/20 1255 08/20/20 1014 08/21/20 0432 08/22/20 0553 08/23/20 0515  WBC 10.3 8.0 13.2* 11.0* 9.4  NEUTROABS  --  6.7  --   --   --   HGB 10.8* 10.2* 10.2* 9.8* 9.8*  HCT 33.2* 31.7* 31.0* 30.7* 30.8*  MCV 86.7 87.1 85.6 86.7 87.0  PLT 345 292 288 252 814   Basic Metabolic Panel: Recent Labs  Lab 08/19/20 1255 08/20/20 1014 08/21/20 0432 08/22/20 0553 08/23/20 0515  NA 138 138 140 140 144  K 2.8* 3.6 3.3* 2.9* 3.6  CL 99 103 106 107 111  CO2 24 23 22 22 22   GLUCOSE 218* 267* 213* 168* 136*  BUN 28* 18 18 14 10   CREATININE 0.68 0.53 0.54 0.51 0.49  CALCIUM 9.3 8.8* 8.8* 8.5* 9.0  MG 1.8 1.7  --  1.7  --   PHOS  --  2.3*  --   --   --  GFR: Estimated Creatinine Clearance: 63.9 mL/min (by C-G formula based on SCr of 0.49 mg/dL). Liver Function Tests: Recent Labs  Lab 08/19/20 1255 08/20/20 1014 08/22/20 0553  AST 19 16 18   ALT 11 12 12   ALKPHOS 56 56 54  BILITOT 1.2 0.9 0.7  PROT 6.9 6.7 5.9*  ALBUMIN 3.2* 3.1* 2.6*   No results for input(s): LIPASE, AMYLASE in the last 168 hours. Recent Labs  Lab 08/22/20 0553  AMMONIA 17   Coagulation Profile: Recent Labs  Lab 08/19/20 1255 08/21/20 0432  INR 1.0 1.1   Cardiac Enzymes: No results for input(s): CKTOTAL, CKMB, CKMBINDEX, TROPONINI in the last 168 hours. BNP (last 3 results) No results for input(s): PROBNP in the last 8760 hours. HbA1C: No results for input(s): HGBA1C in the last 72 hours. CBG: Recent Labs  Lab 08/22/20 1656 08/22/20 2041 08/23/20 0453 08/23/20 0744 08/23/20 1135  GLUCAP 216* 162* 133* 136* 152*   Lipid Profile: No results for input(s): CHOL, HDL, LDLCALC, TRIG, CHOLHDL, LDLDIRECT in the last 72  hours. Thyroid Function Tests: No results for input(s): TSH, T4TOTAL, FREET4, T3FREE, THYROIDAB in the last 72 hours. Anemia Panel: No results for input(s): VITAMINB12, FOLATE, FERRITIN, TIBC, IRON, RETICCTPCT in the last 72 hours. Sepsis Labs: Recent Labs  Lab 08/20/20 0039 08/20/20 1014 08/21/20 0432 08/21/20 2226  PROCALCITON  --   --  21.14 21.59  LATICACIDVEN 2.5* 1.2  --   --     Recent Results (from the past 240 hour(s))  Urine Culture     Status: None   Collection Time: 08/19/20 11:46 PM   Specimen: Urine, Random  Result Value Ref Range Status   Specimen Description   Final    URINE, RANDOM Performed at [facility], [address], [place], [place] [address]    Special Requests   Final    NONE Performed at [facility], [address], [place], [place] [address]    Culture   Final    NO GROWTH Performed at [facility], [place] [address], [place], [place] [address]    Report Status 08/21/2020 FINAL  Final  SARS Coronavirus 2 by RT PCR (hospital order, performed in [facility] hospital lab) Nasopharyngeal Nasopharyngeal Swab     Status: None   Collection Time: 08/20/20 12:39 AM   Specimen: Nasopharyngeal Swab  Result Value Ref Range Status   SARS Coronavirus 2 NEGATIVE NEGATIVE Final    Comment: (NOTE) SARS-CoV-2 target nucleic acids are NOT DETECTED.  The SARS-CoV-2 RNA is generally detectable in upper and lower respiratory specimens during the acute phase of infection. The lowest concentration of SARS-CoV-2 viral copies this assay can detect is 250 copies / mL. A negative result does not preclude SARS-CoV-2 infection and should not be used as the sole basis for treatment or other patient management decisions.  A negative result may occur with improper specimen collection / handling, submission of specimen other than nasopharyngeal swab, presence of viral mutation(s) within the areas targeted by this assay, and inadequate number of  viral copies (<250 copies / mL). A negative result must be combined with clinical observations, patient history, and epidemiological information.  Fact Sheet for Patients:   StrictlyIdeas.no  Fact Sheet for Healthcare Providers: BankingDealers.co.za  This test is not yet approved or  cleared by the [place] FDA and has been authorized for detection and/or diagnosis of SARS-CoV-2 by FDA under an Emergency Use Authorization (EUA).  This EUA will remain in effect (meaning this  test can be used) for the duration of the COVID-19 declaration under Section 564(b)(1) of the Act, 21 U.S.C. section 360bbb-3(b)(1), unless the authorization is terminated or revoked sooner.  Performed at [facility], [address] [place], [place] [address]   Culture, blood (Routine X 2) w Reflex to ID Panel     Status: None (Preliminary result)   Collection Time: 08/20/20 12:39 AM   Specimen: BLOOD  Result Value Ref Range Status   Specimen Description BLOOD BLOOD RIGHT HAND  Final   Special Requests   Final    BOTTLES DRAWN AEROBIC AND ANAEROBIC Blood Culture results may not be optimal due to an excessive volume of blood received in culture bottles   Culture   Final    NO GROWTH 3 DAYS Performed at [facility], [address], [place], [place] [address]    Report Status PENDING  Incomplete  Culture, blood (Routine X 2) w Reflex to ID Panel     Status: None (Preliminary result)   Collection Time: 08/20/20 12:39 AM   Specimen: BLOOD  Result Value Ref Range Status   Specimen Description BLOOD BLOOD LEFT WRIST  Final   Special Requests   Final    BOTTLES DRAWN AEROBIC AND ANAEROBIC Blood Culture results may not be optimal due to an excessive volume of blood received in culture bottles   Culture   Final    NO GROWTH 3 DAYS Performed at [facility], [address], [place], [place] [address]    Report Status PENDING   Incomplete  MRSA PCR Screening     Status: None   Collection Time: 08/20/20 12:43 PM   Specimen: Nasopharyngeal  Result Value Ref Range Status   MRSA by PCR NEGATIVE NEGATIVE Final    Comment:        The GeneXpert MRSA Assay (FDA approved for NASAL specimens only), is one component of a comprehensive MRSA colonization surveillance program. It is not intended to diagnose MRSA infection nor to guide or monitor treatment for MRSA infections. Performed at [facility], [address] [place], [place] [address]   CSF culture     Status: None (Preliminary result)   Collection Time: 08/21/20  8:43 AM   Specimen: PATH Cytology CSF; Cerebrospinal Fluid  Result Value Ref Range Status   Specimen Description   Final    CSF Performed at [facility], [address], [place], [place] [address]    Special Requests   Final    NONE Performed at [facility], [address] [place], [place] [address]    Gram Stain   Final    NO ORGANISMS SEEN WBC SEEN RBC SEEN Performed at [facility], [address] [place], [place] [address]    Culture   Final    NO GROWTH 2 DAYS Performed at [facility], [place] [address], [place], [place] [address]    Report Status PENDING  Incomplete  Culture, fungus without smear     Status: None (Preliminary result)   Collection Time: 08/21/20  8:43 AM   Specimen: PATH Cytology CSF; Cerebrospinal Fluid  Result Value Ref Range Status   Specimen Description   Final    CSF Performed at [facility], [address], [place], [place] [address]    Special Requests   Final    NONE Performed at [facility], [address] [place], [place] [address]    Culture   Final  NO FUNGUS ISOLATED AFTER 2 DAYS Performed at [facility], [place] [address] [place], [place] [address]    Report Status PENDING  Incomplete     Radiology Studies: EEG  Result Date: 08/21/2020 Lora Havens, MD      08/21/2020  5:53 PM Patient Name: Bettyjane Shenoy MRN: [id number] Epilepsy Attending: Lora Havens Referring Physician/Provider: Dr Donnetta Simpers Date: 08/21/2020 Duration: 20.59 mins Patient history: 74yo F with ams. EEG to evaluate for seizure Level of alertness: Awake AEDs during EEG study: None Technical aspects: This EEG study was done with scalp electrodes positioned according to the 10-20 International system of electrode placement. Electrical activity was acquired at a sampling rate of 500Hz  and reviewed with a high frequency filter of 70Hz  and a low frequency filter of 1Hz . EEG data were recorded continuously and digitally stored. Description: The posterior dominant rhythm consists of 8-9 Hz activity of moderate voltage (25-35 uV) seen predominantly in posterior head regions, symmetric and reactive to eye opening and eye closing. Hyperventilation and photic stimulation were not performed.   IMPRESSION: This study is within normal limits. No seizures or epileptiform discharges were seen throughout the recording. Priyanka Barbra Sarks    Scheduled Meds: . DULoxetine  60 mg Oral Daily  . hydrocortisone  10 mg Oral Daily  . hydrocortisone  5 mg Oral Daily  . insulin aspart  0-15 Units Subcutaneous TID WC  . insulin aspart  0-5 Units Subcutaneous QHS  . insulin glargine  5 Units Subcutaneous QHS  . latanoprost  1 drop Both Eyes QHS  . metoprolol succinate  50 mg Oral Daily   Continuous Infusions: . sodium chloride 75 mL/hr (08/23/20 1027)     LOS: 3 days   Time spent: 30 minutes.  Lorella Nimrod, MD [facility]  If 7PM-7AM, please contact night-coverage [email or web address]  08/23/2020, 1:53 PM   This record has been created using Systems analyst. Errors have been sought and corrected,but may not always be located. Such creation errors do not reflect on the standard of care.

## 2020-08-24 LAB — GLUCOSE, CAPILLARY
Glucose-Capillary: 128 mg/dL — ABNORMAL HIGH (ref 70–99)
Glucose-Capillary: 167 mg/dL — ABNORMAL HIGH (ref 70–99)
Glucose-Capillary: 141 mg/dL — ABNORMAL HIGH (ref 70–99)
Glucose-Capillary: 117 mg/dL — ABNORMAL HIGH (ref 70–99)
Glucose-Capillary: 160 mg/dL — ABNORMAL HIGH (ref 70–99)

## 2020-08-24 LAB — CSF CULTURE W GRAM STAIN
Culture: NO GROWTH
Gram Stain: NONE SEEN

## 2020-08-24 MED ORDER — LABETALOL HCL 5 MG/ML IV SOLN
20.0000 mg | INTRAVENOUS | Status: DC | PRN
  Administered 2020-08-24: 20 mg via INTRAVENOUS
  Filled 2020-08-24 (×2): qty 4

## 2020-08-24 MED ORDER — NIFEDIPINE ER OSMOTIC RELEASE 90 MG PO TB24
90.0000 mg | ORAL_TABLET | Freq: Every day | ORAL | Status: DC
  Filled 2020-08-24 (×2): qty 1

## 2020-08-24 MED ORDER — NIFEDIPINE ER OSMOTIC RELEASE 30 MG PO TB24
90.0000 mg | ORAL_TABLET | Freq: Every day | ORAL | Status: DC
  Administered 2020-08-24 – 2020-08-25 (×2): 90 mg via ORAL
  Filled 2020-08-24 (×2): qty 3

## 2020-08-24 MED ORDER — ATORVASTATIN CALCIUM 20 MG PO TABS
40.0000 mg | ORAL_TABLET | Freq: Every day | ORAL | Status: DC
  Administered 2020-08-24: 40 mg via ORAL
  Filled 2020-08-24: qty 2

## 2020-08-24 NOTE — TOC Progression Note (Signed)
Transition of Care [facility]) - Progression Note    Patient Details  Name: Cheyenne Anthony MRN: [id number] Date of Birth: Dec 10, 1945  Transition of Care [facility]) CM/SW [place], RN Phone Number: 08/24/2020, 2:06 PM  Clinical Narrative:   Patient has bed offer from [facility]. RNCM attempted to reach out to patient's daughter [name] however no answer. RNCM met with patient in room, sitting up alert ordering lunch. Discussed with patient that she now has a bed offer from [facility], patient pleased and agreeable to accept this referral.  RNCM placed call to [facility] with [facility], informed of decision for [facility] and that patient should be able to discharge tomorrow. Juliann Pulse requests that dc summary be faxed to patient's provider Rande Lawman at [phone]. Claudia Desanctis will provide transportation once patient is ready.          Expected Discharge Plan and Services                                                 Social Determinants of Health (SDOH) Interventions    Readmission Risk Interventions No flowsheet data found.

## 2020-08-24 NOTE — Progress Notes (Addendum)
Physical Therapy Treatment Patient Details Name: Cheyenne Anthony MRN: [id number] DOB: October 12, 1945 Today's Date: 08/24/2020    History of Present Illness Pt is a 75yo F admitted to [facility] on 08/19/20 for daughters c/o  "not acting right" and mild fever of 99.6deg. Pt with recent hospitalization for AMS due to R cerebellar lesion, with brainstem compression and hyrocephalus. Pt admitted to neurosurgical ICU at [facility] and underwent EVD and suboccipital craniotomy for resection of R cerebellar tumor (08/02/20) and secondary suboccipital craniotomy for resection of residual tumor (08/04/20). Imaging revealed: unchanged R lung lobe mass, unchanged R cerebellar mass, old R frontal lobe infarct with unchanged encephalomalacia, and 2-38mm SH over L occipital convexity, likely postoperative. PMH significant for: DM, HTN, Depression, HLD, knee arthritis, lung CA, cerebellar brain metastases s/p suboccipital craniotomy on 1/5. EEG performed on 1/24 - study WNL.    PT Comments    Pt tolerated treatment well today, but further mobility deferred due to fatigue and HTN. Pt able to participate in supine rolling and scooting towards [facility] for linen change/self care ADL's after BM and repositioning. Pt educated pt that as she can feel when she needs to have a BM, she needs to let nursing staff know for proactive intervention; she verbalized understanding. Pt continues to require mod assist for facilitation of mobility, with verbal cues for sequencing. Decreased activity tolerance noted by RPE of 4-6/10 indicating "moderate activity." Pt will continue to benefit from skilled acute PT services to address deficits for return to baseline function. Will continue to recommend SNF at [place].  During chart review, pt with BP of 145/112 mmHg - per APTA guidelines, therapy contraindicated with DBP >110 mmHg. Upon PT entry, pt with BP of 163/93 mmHg. Therefore, light activity performed.    Follow Up Recommendations  SNF     Equipment  Recommendations   (defer to postacute)    Recommendations for Other Services       Precautions / Restrictions Precautions Precautions: Fall Precaution Comments: recent craniotomy 1/5 and 1/7 Restrictions Weight Bearing Restrictions: No    Mobility  Bed Mobility Overal bed mobility: Needs Assistance Bed Mobility: Rolling Rolling: Mod assist         General bed mobility comments: Mod assist for bil rolling in supine for bed linen change and self care ADL's after BM; verbal cues for sequencing/safety  Transfers                 General transfer comment: deferred due to HTN  Ambulation/Gait                   Balance       Sitting balance - Comments: deferred due to HTN                                    Cognition Arousal/Alertness: Awake/alert Behavior During Therapy: WFL for tasks assessed/performed Overall Cognitive Status: No family/caregiver present to determine baseline cognitive functioning Area of Impairment: Orientation;Memory;Safety/judgement;Awareness;Problem solving                     Memory: Decreased recall of precautions;Decreased short-term memory Following Commands: Follows one step commands inconsistently;Follows one step commands with increased time Safety/Judgement: Decreased awareness of safety;Decreased awareness of deficits Awareness: Intellectual Problem Solving: Slow processing;Decreased initiation;Difficulty sequencing;Requires verbal cues General Comments: Pt able to follow 100% of simple one step commands with increased time for processing  Exercises Other Exercises Other Exercises: Pt able to participate in rolling and supine scooting towards [facility] for linen change and self care ADL's after BM; pt able to maintain sidelying position with supervision and UE support on bedrail. Verbal cues for safety. Total assist +2 for supine scooting towards HOB with bed in Trendelenburg position. Deferred further  mobility due to HTN    General Comments        Pertinent Vitals/Pain Pain Assessment: Faces Faces Pain Scale: Hurts little more Pain Location: R knee with mobility Pain Descriptors / Indicators: Aching Pain Intervention(s): Monitored during session;Limited activity within patient's tolerance;Repositioned           PT Goals (current goals can now be found in the care plan section) Acute Rehab PT Goals Patient Stated Goal: To go home with my daughter Time For Goal Achievement: 09/07/20 Potential to Achieve Goals: Fair Progress towards PT goals: Progressing toward goals    Frequency    7X/week      PT Plan Current plan remains appropriate    Co-evaluation              AM-PAC PT "6 Clicks" Mobility   Outcome Measure  Help needed turning from your back to your side while in a flat bed without using bedrails?: A Lot Help needed moving from lying on your back to sitting on the side of a flat bed without using bedrails?: A Lot Help needed moving to and from a bed to a chair (including a wheelchair)?: Total Help needed standing up from a chair using your arms (e.g., wheelchair or bedside chair)?: A Lot Help needed to walk in hospital room?: Total Help needed climbing 3-5 steps with a railing? : Total 6 Click Score: 9    End of Session   Activity Tolerance: Patient tolerated treatment well;Patient limited by lethargy;Treatment limited secondary to medical complications (Comment) (hypertension) Patient left: in bed;with call bell/phone within reach;with bed alarm set;with nursing/sitter in room;with SCD's reapplied Nurse Communication: Mobility status PT Visit Diagnosis: Muscle weakness (generalized) (M62.81);Unsteadiness on feet (R26.81);Difficulty in walking, not elsewhere classified (R26.2);Other symptoms and signs involving the nervous system (B04.888)     Time: 9169-4503 PT Time Calculation (min) (ACUTE ONLY): 30 min  Charges:  $Therapeutic Activity: 8-22  mins                    Herminio Commons, PT, DPT 10:51 AM,08/24/20

## 2020-08-24 NOTE — Care Management Important Message (Signed)
Important Message  Patient Details  Name: Cheyenne Anthony MRN: [id number] Date of Birth: October 07, 1945   Medicare Important Message Given:  Yes     Juliann Pulse A Bryauna Byrum 08/24/2020, 10:44 AM

## 2020-08-24 NOTE — Plan of Care (Signed)
  Problem: Clinical Measurements: Goal: Will remain free from infection Outcome: Progressing   Problem: Clinical Measurements: Goal: Diagnostic test results will improve Outcome: Progressing   Problem: Clinical Measurements: Goal: Respiratory complications will improve Outcome: Progressing   

## 2020-08-24 NOTE — Progress Notes (Signed)
PROGRESS NOTE    Cheyenne Anthony  GEX:[id number] DOB: Jul 29, 1946 DOA: 08/19/2020 PCP: [facility]   Brief Narrative: Taken from prior notes. 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  In the emergency room, noted to have a rectal temperature of 100.6 and MRI noted a small acute to subacute ischemic infarct along margins of resection cavity with small 2 to 3 cm subdural hematoma over the left occipital condyle with no appreciable residual or local recurrent tumor.  Neurosurgery consulted who felt that findings appear to be more in line with patient's recent surgical procedure and recommended neurology consult and EEG.  Admitted to the hospitalist service.  Patient noted to have elevated lactic acid level on admission so started on broad-spectrum antibiotics as concerns for meningitis.  Following admission, patient much more alert and interactive, looking to be back to baseline by day 2.  Seen by neurology and after cleared by neurosurgery, LP done on 1/24 by fluoroscopy.  CSF with high RBC and white cells, lymphocytic predominant, increased protein, cultures remain negative, more consistent with reactive with recent surgery.  EEG was done and it was negative for any seizure-like activity.  There was some concern of staring spells.  Neurology does not want to start any antiseizure medications and can consider if she continued to have more spells.  No more spells noted.  There was some concern of paraneoplastic syndrome/Lambert-Eaton syndrome due to bilateral lower extremity weakness, paraneoplastic labs along with calcium channel antibodies were sent by neurology.  According to neurology note it seems more like  deconditioning.  Mentation improved and she appears to be at her baseline now.  Subjective: Patient was seen and examined today.  No new complaint.  She does not want to return to the same facility and happy that he she had a bed at [facility].  Most likely be going tomorrow.  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  Resolved. Patient had abnormal CT head findings with history of metastatic lung cancer with cerebellar mets s/p post craniotomy. CSF cultures were negative.  Elevated white cell count with lymphocytic predominance was more consistent with reactive and her history of malignancy. She was initially started on ceftriaxone and vancomycin and later transitioned to ceftazidime and vancomycin.  ID was consulted. -Antibiotics were discontinued yesterday. -No obvious source of infection so sepsis ruled out.  Bilateral lower extremity weakness.  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  Neurology was consulted and labs along with calcium channel antibodies were sent. -Follow-up lab results -Neurology thinks that it might be due to physical deconditioning. -PT/OT evaluation-recommending SNF placement. -[facility]  Low cortisol level.  Might be contributory adrenal crisis element.  As symptoms improved with addition of steroid.  Apparently she was discharged on Decadron with a tapering dose for  5 days from [place].  According to pace representative she did got dose tapering doses. -Continue Cortef 10 mg in the morning and 5 mg in  afternoon.  Concern of staring spells.  EEG was negative for any epileptiform activity. Per neurology no need for any antiepileptic medications-can be considered if continue to have those spells.  Hypokalemia.  Resolved -Replete magnesium and potassium as needed and monitor.  Lactic acidosis.  Resolved.  Patient was also on Metformin and poor p.o. intake.  Type 2 diabetes mellitus with renal manifestations.  -Continue with Lantus and SSI  History of depression.  No acute concern. -Continue with Cymbalta.  Glaucoma. -Continue with home eyedrops.  Objective: Vitals:   08/24/20 0808 08/24/20 1039 08/24/20 1126 08/24/20 1455  BP: (!) 145/112 (!) 163/93 (!) 166/108 135/89  Pulse: 93  91 85  Resp:   18 18  Temp:   97.7 F (36.5 C) 97.9 F (36.6 C)  TempSrc:      SpO2:   100% 97%  Weight:      Height:        Intake/Output Summary (Last 24 hours) at 08/24/2020 1548 Last data filed at 08/24/2020 1015 Gross per 24 hour  Intake 120 ml  Output 800 ml  Net -680 ml   Filed Weights   08/19/20 1237  Weight: 79.4 kg    Examination:  General.  Well-developed elderly lady, in no acute distress. Pulmonary.  Lungs clear bilaterally, normal respiratory effort. CV.  Regular rate and rhythm, no JVD, rub or murmur. Abdomen.  Soft, nontender, nondistended, BS positive. CNS.  Alert and oriented x3.  Bilateral lower extremity weakness. Extremities.  No edema, no cyanosis, pulses intact and symmetrical. Psychiatry.  Judgment and insight appears normal.  DVT prophylaxis: SCDs, recent brain surgery Code Status: Full Family Communication: Discussed with patient, call daughter with no response. Disposition Plan:  Status is: Inpatient  Remains inpatient appropriate because:Inpatient level of care appropriate due to severity of illness   Dispo: The patient is from: SNF              Anticipated d/c is to: SNF              Anticipated d/c date is: 1 day.              Patient currently is  medically stable.   Difficult to place patient No  Patient is high risk for deterioration and death secondary to comorbidities includes metastatic disease.  Consultants:   ID  Neurology  Neuro surgery  Procedures:  Antimicrobials:   Data Reviewed: I have personally reviewed following labs and imaging studies  CBC: Recent Labs  Lab 08/19/20 1255 08/20/20 1014 08/21/20 0432 08/22/20 0553 08/23/20 0515  WBC 10.3 8.0 13.2* 11.0* 9.4  NEUTROABS  --  6.7  --   --   --   HGB 10.8* 10.2* 10.2* 9.8* 9.8*  HCT 33.2* 31.7* 31.0* 30.7* 30.8*  MCV 86.7 87.1 85.6 86.7 87.0  PLT 345 292 288 252 427   Basic Metabolic Panel: Recent Labs  Lab 08/19/20 1255 08/20/20 1014 08/21/20 0432 08/22/20 0553 08/23/20 0515  NA 138 138 140 140 144  K 2.8* 3.6 3.3* 2.9* 3.6  CL 99 103 106 107 111  CO2 24 23 22 22 22   GLUCOSE 218* 267* 213* 168* 136*  BUN 28* 18 18 14 10   CREATININE 0.68 0.53 0.54 0.51 0.49  CALCIUM 9.3 8.8* 8.8* 8.5* 9.0  MG 1.8 1.7  --  1.7  --  PHOS  --  2.3*  --   --   --    GFR: Estimated Creatinine Clearance: 63.9 mL/min (by C-G formula based on SCr of 0.49 mg/dL). Liver Function Tests: Recent Labs  Lab 08/19/20 1255 08/20/20 1014 08/22/20 0553  AST 19 16 18   ALT 11 12 12   ALKPHOS 56 56 54  BILITOT 1.2 0.9 0.7  PROT 6.9 6.7 5.9*  ALBUMIN 3.2* 3.1* 2.6*   No results for input(s): LIPASE, AMYLASE in the last 168 hours. Recent Labs  Lab 08/22/20 0553  AMMONIA 17   Coagulation Profile: Recent Labs  Lab 08/19/20 1255 08/21/20 0432  INR 1.0 1.1   Cardiac Enzymes: No results for input(s): CKTOTAL, CKMB, CKMBINDEX, TROPONINI in the last 168 hours. BNP (last 3 results) No results for input(s): PROBNP in the last 8760 hours. HbA1C: No results for input(s): HGBA1C in the last 72 hours. CBG: Recent Labs  Lab 08/23/20 1642 08/23/20 2244 08/24/20 0727 08/24/20 1123 08/24/20 1222  GLUCAP 148* 102* 128* 167* 141*   Lipid Profile: No results for  input(s): CHOL, HDL, LDLCALC, TRIG, CHOLHDL, LDLDIRECT in the last 72 hours. Thyroid Function Tests: No results for input(s): TSH, T4TOTAL, FREET4, T3FREE, THYROIDAB in the last 72 hours. Anemia Panel: No results for input(s): VITAMINB12, FOLATE, FERRITIN, TIBC, IRON, RETICCTPCT in the last 72 hours. Sepsis Labs: Recent Labs  Lab 08/20/20 0039 08/20/20 1014 08/21/20 0432 08/21/20 2226  PROCALCITON  --   --  21.14 21.59  LATICACIDVEN 2.5* 1.2  --   --     Recent Results (from the past 240 hour(s))  Urine Culture     Status: None   Collection Time: 08/19/20 11:46 PM   Specimen: Urine, Random  Result Value Ref Range Status   Specimen Description   Final    URINE, RANDOM Performed at [facility], [address] [place], [place] [address]    Special Requests   Final    NONE Performed at [facility], [address], [place], [place] [address]    Culture   Final    NO GROWTH Performed at [facility], [place] [address], [place], [place] [address]    Report Status 08/21/2020 FINAL  Final  SARS Coronavirus 2 by RT PCR (hospital order, performed in [facility] hospital lab) Nasopharyngeal Nasopharyngeal Swab     Status: None   Collection Time: 08/20/20 12:39 AM   Specimen: Nasopharyngeal Swab  Result Value Ref Range Status   SARS Coronavirus 2 NEGATIVE NEGATIVE Final    Comment: (NOTE) SARS-CoV-2 target nucleic acids are NOT DETECTED.  The SARS-CoV-2 RNA is generally detectable in upper and lower respiratory specimens during the acute phase of infection. The lowest concentration of SARS-CoV-2 viral copies this assay can detect is 250 copies / mL. A negative result does not preclude SARS-CoV-2 infection and should not be used as the sole basis for treatment or other patient management decisions.  A negative result may occur with improper specimen collection / handling, submission of specimen other than nasopharyngeal swab, presence of viral mutation(s)  within the areas targeted by this assay, and inadequate number of viral copies (<250 copies / mL). A negative result must be combined with clinical observations, patient history, and epidemiological information.  Fact Sheet for Patients:   StrictlyIdeas.no  Fact Sheet for Healthcare Providers: BankingDealers.co.za  This test is not yet approved or  cleared by the [place] FDA and has been authorized for detection and/or diagnosis of SARS-CoV-2 by  FDA under an Emergency Use Authorization (EUA).  This EUA will remain in effect (meaning this test can be used) for the duration of the COVID-19 declaration under Section 564(b)(1) of the Act, 21 U.S.C. section 360bbb-3(b)(1), unless the authorization is terminated or revoked sooner.  Performed at [facility], [address] [place], [place] [address]   Culture, blood (Routine X 2) w Reflex to ID Panel     Status: None (Preliminary result)   Collection Time: 08/20/20 12:39 AM   Specimen: BLOOD  Result Value Ref Range Status   Specimen Description BLOOD BLOOD RIGHT HAND  Final   Special Requests   Final    BOTTLES DRAWN AEROBIC AND ANAEROBIC Blood Culture results may not be optimal due to an excessive volume of blood received in culture bottles   Culture   Final    NO GROWTH 4 DAYS Performed at [facility], [address] [place], [place] [address]    Report Status PENDING  Incomplete  Culture, blood (Routine X 2) w Reflex to ID Panel     Status: None (Preliminary result)   Collection Time: 08/20/20 12:39 AM   Specimen: BLOOD  Result Value Ref Range Status   Specimen Description BLOOD BLOOD LEFT WRIST  Final   Special Requests   Final    BOTTLES DRAWN AEROBIC AND ANAEROBIC Blood Culture results may not be optimal due to an excessive volume of blood received in culture bottles   Culture   Final    NO GROWTH 4 DAYS Performed at [facility], [address], [place], [place] [address]    Report Status PENDING  Incomplete  MRSA PCR Screening     Status: None   Collection Time: 08/20/20 12:43 PM   Specimen: Nasopharyngeal  Result Value Ref Range Status   MRSA by PCR NEGATIVE NEGATIVE Final    Comment:        The GeneXpert MRSA Assay (FDA approved for NASAL specimens only), is one component of a comprehensive MRSA colonization surveillance program. It is not intended to diagnose MRSA infection nor to guide or monitor treatment for MRSA infections. Performed at [facility], [address] [place], [place] [address]   CSF culture     Status: None   Collection Time: 08/21/20  8:43 AM   Specimen: PATH Cytology CSF; Cerebrospinal Fluid  Result Value Ref Range Status   Specimen Description   Final    CSF Performed at [facility], [address], [place], [place] [address]    Special Requests   Final    NONE Performed at [facility], [address] [place], [place] [address]    Gram Stain   Final    NO ORGANISMS SEEN WBC SEEN RBC SEEN Performed at [facility], [address], [place], [place] [address]    Culture   Final    NO GROWTH 3 DAYS Performed at [facility], [place] [address] [place], [place] [address]    Report Status 08/24/2020 FINAL  Final  Culture, fungus without smear     Status: None (Preliminary result)   Collection Time: 08/21/20  8:43 AM   Specimen: PATH Cytology CSF; Cerebrospinal Fluid  Result Value Ref Range Status   Specimen Description   Final    CSF Performed at [facility], [address], [place],  [address]    Special Requests   Final    NONE Performed at [facility], [phone]  [address] [place], [place] [address]    Culture   Final    NO FUNGUS ISOLATED AFTER 3 DAYS Performed at [facility], [place] [address], [place], [place] [address]    Report Status PENDING  Incomplete     Radiology Studies: No  results found.  Scheduled Meds: . DULoxetine  60 mg Oral Daily  . hydrocortisone  10 mg Oral Daily  . hydrocortisone  5 mg Oral Daily  . insulin aspart  0-15 Units Subcutaneous TID WC  . insulin aspart  0-5 Units Subcutaneous QHS  . insulin glargine  5 Units Subcutaneous QHS  . latanoprost  1 drop Both Eyes QHS  . metoprolol succinate  50 mg Oral Daily   Continuous Infusions: . sodium chloride 75 mL/hr (08/23/20 1027)     LOS: 4 days   Time spent: 30 minutes.  Lorella Nimrod, MD [facility]  If 7PM-7AM, please contact night-coverage [email or web address]  08/24/2020, 3:48 PM   This record has been created using Systems analyst. Errors have been sought and corrected,but may not always be located. Such creation errors do not reflect on the standard of care.

## 2020-08-24 NOTE — Discharge Summary (Signed)
Physician Discharge Summary  Cheyenne Anthony ZJQ:[id number] DOB: March 12, 1946 DOA: 08/19/2020  PCP: [place] date: 08/19/2020 Discharge date: 08/30/2020  Admitted From: SNF Disposition: SNF  Recommendations for Outpatient Follow-up:  1. Follow up with PCP in 1-2 weeks 2. Follow-up with neurosurgery 3. Follow-up with oncology 4. Please obtain BMP/CBC in one week 5. 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 In the emergency room, noted to have a rectal temperature of 100.6 and MRI noted a small acute to subacute ischemic infarct along margins of resection cavity with small 2 to 3 cm subdural hematoma over the left occipital condyle with no appreciable residual or local recurrent tumor. Neurosurgery consulted who felt that findings appear to be more in line with patient's recent surgical procedure and recommended neurology consult and EEG. Admitted to the hospitalist service. Patient noted to have elevated lactic acid level on admission so started on broad-spectrum antibiotics as concerns for meningitis.  Following admission, patient much more alert and interactive, looking to be back to baseline by day 2. Seen by neurology and after cleared by neurosurgery, LP done on 1/24 by fluoroscopy.  CSF with high RBC and white cells, lymphocytic predominant, increased protein, cultures remain negative, more consistent with reactive with recent surgery.  Antibiotics were  discontinued after discussing with ID.  EEG was done and it was negative for any seizure-like activity.  There was some concern of staring spells.  Neurology does not want to start any antiseizure medications and can consider if she continued to have more spells.  No more spells noted.  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 According to neurology note it seems more like deconditioning.  Patient appears at her baseline with then next day.  She was also found to have a very low cortisol level and adrenal crisis element might be playing a role in her mentation.  Apparently she was given a tapering dose of Decadron when discharged from [facility].  I talked with pace representative and according to them she did completed that 5 days of tapering dose. He was started on Cortef with improvement in her mentation and overall wellbeing.  She will continue with Cortef 10 mg in the morning and 5 mg in the afternoon and will need a outpatient endocrinology evaluation.  She will continue with rest of her medications and follow-up with her providers which include her neurosurgeon and oncologist.  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   atorvastatin 40 MG tablet Commonly known as: LIPITOR Take 40 mg by mouth daily.   DULoxetine 60 MG capsule Commonly known as: CYMBALTA Take 60 mg by mouth daily.   hydrocortisone 10 MG tablet Commonly known as: CORTEF Take 1 tablet (10 mg total) by mouth daily. With Breakfast   hydrocortisone 5 MG tablet Commonly known as: CORTEF Take 1 tablet (5 mg total) by mouth daily. At 2 PM   latanoprost 0.005 % ophthalmic solution Commonly known as: XALATAN Place 1 drop into both eyes at bedtime.   lisinopril-hydrochlorothiazide 20-25 MG tablet Commonly known as: ZESTORETIC Take 1 tablet by mouth daily.   metoprolol succinate 50 MG 24 hr tablet Commonly known as: TOPROL-XL Take 50 mg by mouth daily. Take with or immediately following a meal.   MiraLax 17 g packet Generic drug: polyethylene glycol Take 17 g by mouth daily.   NIFEdipine 90 MG 24 hr tablet Commonly known as: ADALAT CC Take 90 mg by mouth daily.   senna-docusate 8.6-50 MG tablet Commonly known as: Senokot-S Take 2 tablets by mouth in the morning and at bedtime.   sitaGLIPtin-metformin 50-1000 MG tablet Commonly known as: JANUMET Take 1 tablet by mouth 2 (two) times daily with a meal.       Contact information for after-discharge care    Destination    [facility] Preferred SNF .   Service: Skilled Nursing Contact information: [address] [place] [phone]                 No Known Allergies  Consultations:  ID  Neurology  Neuro surgery  Procedures/Studies: EEG  Result Date: 08/21/2020 Lora Havens, MD     08/21/2020  5:53 PM Patient Name: Cheyenne Anthony MRN: [id number] Epilepsy Attending: Lora Havens Referring Physician/Provider: Dr Donnetta Simpers Date: 08/21/2020 Duration: 20.59 mins  Patient history: 74yo F with ams. EEG to evaluate for seizure Level of alertness: Awake AEDs during EEG study: None Technical aspects: This EEG study was done with scalp electrodes positioned according to the 10-20 International system of electrode placement. Electrical activity was acquired at a sampling rate of 500Hz  and reviewed with a high frequency filter of 70Hz  and a low frequency filter of 1Hz . EEG data were recorded continuously and digitally stored. Description: The posterior dominant rhythm consists of 8-9 Hz activity of moderate voltage (25-35 uV) seen predominantly in posterior head regions, symmetric and reactive to eye opening and eye closing. Hyperventilation and photic stimulation were not performed.   IMPRESSION: This study is within normal limits. No seizures or epileptiform discharges were seen throughout the recording. Lora Havens   DG Chest 2 View  Result Date: 08/20/2020 CLINICAL DATA:  Fever and altered mental status. EXAM: CHEST - 2 VIEW COMPARISON:  07/31/2020 FINDINGS: Shallow inspiration. Heart size and pulmonary vascularity are normal. Mass or rounded area of consolidation in the right middle lung measuring 5.5 cm diameter, unchanged since prior studies. Left lung is clear. No pleural effusions. No pneumothorax. Calcification of the aorta. Degenerative changes in the spine and shoulders. IMPRESSION: Right middle lobe mass is unchanged. No developing  consolidation or edema. Electronically Signed   By: Lucienne Capers M.D.   On: 08/20/2020 00:24   CT Head Wo Contrast  Result Date: 08/19/2020 CLINICAL DATA:  Delirium. EXAM: CT HEAD WITHOUT CONTRAST TECHNIQUE: Contiguous axial images were obtained from the base of the skull through the vertex without intravenous contrast. COMPARISON:  July 31, 2020 FINDINGS: Brain: Mass of right cerebellum is unchanged compared to prior exam. 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 Mild chronic bilateral  periventricular white matter small vessel ischemic changes are noted. There is no acute hemorrhage, midline shift, or hydrocephalus. Vascular: No hyperdense vessel is noted. Skull: Status post prior right occipital craniectomy unchanged. Sinuses/Orbits: No acute finding. Other: None. IMPRESSION: 1. No acute hemorrhage or infarct identified. 2. Mass of right cerebellum is unchanged compared to prior exam. 3. 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 Electronically Signed   By: Abelardo Diesel M.D.   On: 08/19/2020 14:02   CT HEAD WO CONTRAST  Result Date: 07/31/2020 CLINICAL DATA:  Mental status changes of unknown cause. Weakness and confusion worsening over the last week. EXAM: CT HEAD WITHOUT CONTRAST TECHNIQUE: Contiguous axial images were obtained from the base of the skull through the vertex without intravenous contrast. COMPARISON:  None. 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 There is a mass lesion within the right cerebellum measuring 4-5 cm in diameter, with areas of internal necrosis. Regional vasogenic edema. Mass-effect upon the fourth ventricle with obstructive hydrocephalus of the lateral and third ventricles. Most likely diagnosis is metastatic disease. Primary tumor not excluded but much less common. Vascular: There is atherosclerotic calcification of the major vessels at the base of the brain. Skull: Normal Sinuses/Orbits: Paranasal sinus opacification on the left likely due to ostiomeatal unit disease. Right-sided sinuses are clear. Orbits negative. Other: None IMPRESSION: 1. 4-5 cm mass lesion within the right cerebellum, with areas of internal necrosis. Surrounding vasogenic edema. Mass-effect upon the fourth ventricle with obstructive hydrocephalus of the lateral and third ventricles. Most likely diagnosis is  metastatic disease. Primary tumor not excluded but much less common. 2. Background pattern of age related volume loss, chronic small-vessel ischemic changes of the white matter, and old cortical and subcortical infarction in the right frontal operculum region. 3. Paranasal sinus opacification on the left likely due to ostiomeatal unit disease. Electronically Signed   By: Nelson Chimes M.D.   On: 07/31/2020 19:15   CT CHEST W CONTRAST  Result Date: 07/31/2020 CLINICAL DATA:  Mental status changes. Abnormal chest radiography with density on the right. EXAM: CT CHEST WITH CONTRAST TECHNIQUE: Multidetector CT imaging of the chest was performed during intravenous contrast administration. CONTRAST:  87mL OMNIPAQUE IOHEXOL 300 MG/ML  SOLN COMPARISON:  Chest radiography same day FINDINGS: Cardiovascular: Mild cardiomegaly. Coronary artery calcification. Aortic atherosclerosis. No pericardial effusion. Mediastinum/Nodes: No mediastinal lymphadenopathy. Early right hilar lymphadenopathy, the largest node measuring 15 mm. Lungs/Pleura: Right middle lobe mass measuring 5-6 cm in diameter. Lymphangitic spread of carcinoma within the right middle lobe. Broad surface along the pleural surface, but without pleural effusion. No other pulmonary parenchymal lesion. No sign of underlying emphysema. Upper Abdomen: No abnormality seen. Right adrenal gland not included in the region studied. Musculoskeletal: Negative IMPRESSION: 1. 5-6 cm right middle lobe mass consistent with primary bronchogenic carcinoma. Lymphangitic spread of carcinoma within the right middle lobe. Early right  hilar lymphadenopathy, the largest node measuring 15 mm. 2. Cardiomegaly. Coronary artery calcification. 3. Aortic atherosclerosis. Aortic Atherosclerosis (ICD10-I70.0). Electronically Signed   By: Nelson Chimes M.D.   On: 07/31/2020 19:19   MR Brain W and Wo Contrast  Result Date: 08/20/2020 CLINICAL DATA:  Initial evaluation for acute altered mental  status. EXAM: MRI HEAD WITHOUT AND WITH CONTRAST TECHNIQUE: Multiplanar, multiecho pulse sequences of the brain and surrounding structures were obtained without and with intravenous contrast. CONTRAST:  7.33mL GADAVIST GADOBUTROL 1 MMOL/ML IV SOLN COMPARISON:  Comparison made with prior head CT from 08/19/2020 as well as previous CT from 07/31/2020. FINDINGS: Brain: Postoperative changes from recent right suboccipital craniotomy for resection of a right cerebellar mass are seen. Postoperative changes in blood products present within the resection cavity at the right cerebellum. Previously seen vasogenic edema within the right cerebellum and posterior fossa is markedly improved as compared to previous CT from 07/31/2020, with wide patency of the fourth ventricle now seen. Small amount of residual vasogenic edema is seen within the right cerebellum (series 16, image 9). The tumor itself has been largely resected, with no convincing evidence for residual or locally recurrent tumor at this time, although small tumor could conceivably be obscured by residual postoperative T1 hyperintensity within this region. Note is made of a small curvilinear area of infarction along the margin of the resection cavity (series 5, images 11, 10). Normal expected postoperative dural thickening and enhancement seen about the cerebellum and overlying the right cerebral hemisphere. There is a small subdural hematoma overlying the left occipital convexity measuring no more than 2-3 mm in maximal thickness (series 9, image 13). No significant mass effect. Single punctate focus of possible enhancement noted at the posterior left frontotemporal region (series 20, image 14), nonspecific, but could reflect a tiny metastatic implant versus vascular focus of enhancement. No other convincing evidence for metastatic disease elsewhere within the brain. No other abnormal enhancement or mass lesion. Linear diffusion signal abnormality seen along the  course of a prior ventriculostomy tract at the anterior right frontal lobe (series 5, image 34). No other evidence for acute or subacute ischemia elsewhere within the brain. Chronic right MCA distribution infarct involving the right frontal operculum noted. Underlying moderate chronic microvascular ischemic disease. No midline shift or hydrocephalus. Pituitary gland and suprasellar region within normal limits. Midline structures intact. Vascular: Major intracranial vascular flow voids are grossly maintained at the skull base, although evaluation limited by motion artifact. Skull and upper cervical spine: Craniocervical junction within normal limits. Bone marrow signal intensity normal. No focal marrow replacing lesion. Post craniotomy changes present at the right suboccipital region without adverse features. Sinuses/Orbits: Globes and orbital soft tissues within normal limits. Scattered mucosal thickening noted within the left ethmoidal air cells and left maxillary sinus. Paranasal sinuses are otherwise largely clear. Trace right mastoid effusion noted, of doubtful significance. Other: None. IMPRESSION: 1. Postoperative changes from recent right suboccipital craniotomy for resection of a right cerebellar mass. No appreciable residual or locally recurrent tumor. Previously seen vasogenic edema within the right cerebellum is markedly improved, with only mild edema now seen within this region. 2. Small acute to subacute ischemic infarct along the margin of the resection cavity as above. 3. 2-3 mm subdural hematoma overlying the left occipital convexity without significant mass effect, presumably postoperative. 4. Single punctate focus of possible enhancement involving the posterior left frontotemporal region, nonspecific, but could reflect a tiny metastatic implant versus vascular focus of enhancement. Attention at follow-up recommended. 5.  No other acute intracranial abnormality. 6. Chronic right MCA territory infarct  with underlying moderate chronic microvascular ischemic disease. Electronically Signed   By: Jeannine Boga M.D.   On: 08/20/2020 03:55   US Venous Img Lower Bilateral (DVT)  Result Date: 08/20/2020 CLINICAL DATA:  Leg pain/swelling EXAM: BILATERAL LOWER EXTREMITY VENOUS DOPPLER ULTRASOUND TECHNIQUE: Gray-scale sonography with compression, as well as color and duplex ultrasound, were performed to evaluate the deep venous system(s) from the level of the common femoral vein through the popliteal and proximal calf veins. COMPARISON:  None. FINDINGS: VENOUS LEFT: Nonocclusive thrombus within the profunda femoral vein. Common femoral vein, saphenofemoral vein, femoral and popliteal veins are patent. Evaluation of the calf veins are limited due to edema. RIGHT: Normal compressibility of the common femoral, superficial femoral, and popliteal veins, as well as the visualized calf veins. Visualized portions of profunda femoral vein and great saphenous vein unremarkable. No filling defects to suggest DVT on grayscale or color Doppler imaging. Doppler waveforms show normal direction of venous flow, normal respiratory plasticity and response to augmentation. OTHER None. Limitations: none IMPRESSION: Nonocclusive thrombus within the left profunda femoral vein. Otherwise patent. No evidence of deep venous thrombosis on the right. Electronically Signed   By: Julian Hy M.D.   On: 08/20/2020 15:08   MR TOTAL SPINE METS SCREENING  Result Date: 08/20/2020 CLINICAL DATA:  Myelopathy, acute or progressive Paraspinal mass/tumor Neck pain, acute, known malignancy EXAM: MRI TOTAL SPINE WITHOUT AND WITH CONTRAST TECHNIQUE: Multisequence MR imaging of the spine from the cervical spine to the sacrum was performed prior to and following IV contrast administration for evaluation of spinal metastatic disease. CONTRAST:  7.71mL GADAVIST GADOBUTROL 1 MMOL/ML IV SOLN COMPARISON:  08/20/2020 and prior. FINDINGS: MRI CERVICAL  SPINE FINDINGS Alignment: Physiologic. Vertebrae: Normal bone marrow signal intensity. No focal osseous lesion. Cord: Normal signal and morphology. Posterior Fossa, vertebral arteries: Please see same day MRI for findings above the craniocervical junction. Disc levels: Mild multilevel desiccation. Posterior protrusions/disc bulge at the C2-3, C5-6 and C6-7 levels with no greater than mild spinal canal narrowing. No cord impingement. Paraspinal tissues: Negative. MRI THORACIC SPINE FINDINGS Alignment:  Normal. Vertebrae: Normal bone marrow signal intensity. No focal osseous lesions. Cord:  Normal signal and morphology. Paraspinal and other soft tissues: Negative. Disc levels: Small posterior protrusions the T6-T10 level. No significant spinal canal narrowing or cord impingement. MRI LUMBAR SPINE FINDINGS Segmentation:  Standard. Alignment:  Grade 1 L4-5 anterolisthesis. Vertebrae: No focal osseous lesion. Vertebral body heights are preserved. Degenerative related inflammatory changes involving the facet joints at the L4-S1 level. Conus medullaris and cauda equina: Conus extends to the L1 level. Conus and cauda equina appear grossly normal. Disc levels: Multilevel desiccation. Small posterior protrusions/discs bulge at the L2-S1 levels with no greater than mild spinal canal narrowing. No impingement of the conus or cauda equina. Paraspinal and other soft tissues: Negative. Please note that image quality is degraded by motion artifact. IMPRESSION: No evidence of metastases. No high-grade spinal canal narrowing or cord impingement. Multilevel spondylosis. Electronically Signed   By: Primitivo Gauze M.D.   On: 08/20/2020 14:41   DG FLUORO GUIDED LOC OF NEEDLE/CATH TIP FOR SPINAL INJECT LT  Result Date: 08/21/2020 CLINICAL DATA:  Encephalopathy EXAM: DIAGNOSTIC LUMBAR PUNCTURE UNDER FLUOROSCOPIC GUIDANCE FLUOROSCOPY TIME:  Fluoroscopy Time:  0.2 minute Radiation Exposure Index (if provided by the fluoroscopic  device): 1.1 mGy Number of Acquired Spot Images: 0 PROCEDURE: Informed consent was obtained from the patient prior to the  procedure, including potential complications of headache, allergy, and pain. With the patient prone, the lower back was prepped with Betadine. 1% Lidocaine was used for local anesthesia. Lumbar puncture was performed at the L2-3 level using a 22 gauge needle with return of blood tinged CSF with progressive clearing 2 clear CSF. 8 ml of CSF were obtained for laboratory studies. The patient tolerated the procedure well and there were no apparent complications. IMPRESSION: Successful fluoroscopic guided lumbar puncture. Electronically Signed   By: Kathreen Devoid   On: 08/21/2020 09:18    Subjective:   Discharge Exam: Vitals:   08/25/20 0751 08/25/20 1130  BP: 130/76 123/74  Pulse: 91 86  Resp: 20 18  Temp: 99.3 F (37.4 C) 98 F (36.7 C)  SpO2: 100% 100%   Vitals:   08/24/20 2344 08/25/20 0619 08/25/20 0751 08/25/20 1130  BP: 111/77 119/84 130/76 123/74  Pulse: 92 92 91 86  Resp: 18 16 20 18   Temp: 98.3 F (36.8 C) 99.3 F (37.4 C) 99.3 F (37.4 C) 98 F (36.7 C)  TempSrc:   Oral   SpO2: 96% 94% 100% 100%  Weight:      Height:        General: Pt is alert, awake, not in acute distress Cardiovascular: RRR, S1/S2 +, no rubs, no gallops Respiratory: CTA bilaterally, no wheezing, no rhonchi Abdominal: Soft, NT, ND, bowel sounds + Extremities: no edema, no cyanosis   The results of significant diagnostics from this hospitalization (including imaging, microbiology, ancillary and laboratory) are listed below for reference.    Microbiology: Recent Results (from the past 240 hour(s))  CSF culture     Status: None   Collection Time: 08/21/20  8:43 AM   Specimen: PATH Cytology CSF; Cerebrospinal Fluid  Result Value Ref Range Status   Specimen Description   Final    CSF Performed at [facility], [address], [place], [place] [address]    Special  Requests   Final    NONE Performed at [facility], [address] [place], [place] [address]    Gram Stain   Final    NO ORGANISMS SEEN WBC SEEN RBC SEEN Performed at [facility], [address] [place], [place] [address]    Culture   Final    NO GROWTH 3 DAYS Performed at [facility], [place] [address] [place], [place] [address]    Report Status 08/24/2020 FINAL  Final  Culture, fungus without smear     Status: None (Preliminary result)   Collection Time: 08/21/20  8:43 AM   Specimen: PATH Cytology CSF; Cerebrospinal Fluid  Result Value Ref Range Status   Specimen Description   Final    CSF Performed at [facility], [address], [place], [place] [address]    Special Requests   Final    NONE Performed at [facility], [address], [place], [place] [address]    Culture   Final    NO FUNGUS ISOLATED AFTER 9 DAYS Performed at [facility], [place] [address], [place],  [address]    Report Status PENDING  Incomplete  SARS CORONAVIRUS 2 (TAT 6-24 HRS) Nasopharyngeal Nasopharyngeal Swab     Status: None   Collection Time: 08/24/20  3:48 PM   Specimen: Nasopharyngeal Swab  Result Value Ref Range Status   SARS Coronavirus 2 NEGATIVE NEGATIVE Final    Comment: (NOTE) SARS-CoV-2 target nucleic acids are NOT DETECTED.  The SARS-CoV-2 RNA  is generally detectable in upper and lower respiratory specimens during the acute phase of infection. Negative results do not preclude SARS-CoV-2 infection, do not rule out co-infections with other pathogens, and should not be used as the sole basis for treatment or other patient management decisions. Negative results must be combined with clinical observations, patient history, and epidemiological information. The expected result is Negative.  Fact Sheet for Patients: SugarRoll.be  Fact Sheet for Healthcare  Providers: https://www.woods-mathews.com/  This test is not yet approved or cleared by the [place] FDA and  has been authorized for detection and/or diagnosis of SARS-CoV-2 by FDA under an Emergency Use Authorization (EUA). This EUA will remain  in effect (meaning this test can be used) for the duration of the COVID-19 declaration under Se ction 564(b)(1) of the Act, 21 U.S.C. section 360bbb-3(b)(1), unless the authorization is terminated or revoked sooner.  Performed at [facility], [place] [address], [place], [place] [address]      Labs: BNP (last 3 results) Recent Labs    07/31/20 1110  BNP 295.1*   Basic Metabolic Panel: No results for input(s): NA, K, CL, CO2, GLUCOSE, BUN, CREATININE, CALCIUM, MG, PHOS in the last 168 hours. Liver Function Tests: No results for input(s): AST, ALT, ALKPHOS, BILITOT, PROT, ALBUMIN in the last 168 hours. No results for input(s): LIPASE, AMYLASE in the last 168 hours. No results for input(s): AMMONIA in the last 168 hours. CBC: No results for input(s): WBC, NEUTROABS, HGB, HCT, MCV, PLT in the last 168 hours. Cardiac Enzymes: No results for input(s): CKTOTAL, CKMB, CKMBINDEX, TROPONINI in the last 168 hours. BNP: Invalid input(s): POCBNP CBG: Recent Labs  Lab 08/24/20 1222 08/24/20 1641 08/24/20 2206 08/25/20 0749 08/25/20 1120  GLUCAP 141* 117* 160* 135* 162*   D-Dimer No results for input(s): DDIMER in the last 72 hours. Hgb A1c No results for input(s): HGBA1C in the last 72 hours. Lipid Profile No results for input(s): CHOL, HDL, LDLCALC, TRIG, CHOLHDL, LDLDIRECT in the last 72 hours. Thyroid function studies No results for input(s): TSH, T4TOTAL, T3FREE, THYROIDAB in the last 72 hours.  Invalid input(s): FREET3 Anemia work up No results for input(s): VITAMINB12, FOLATE, FERRITIN, TIBC, IRON, RETICCTPCT in the last 72 hours. Urinalysis    Component Value Date/Time   COLORURINE YELLOW (A) 08/19/2020  2346   APPEARANCEUR HAZY (A) 08/19/2020 2346   LABSPEC 1.025 08/19/2020 2346   PHURINE 5.0 08/19/2020 2346   GLUCOSEU NEGATIVE 08/19/2020 2346   HGBUR NEGATIVE 08/19/2020 2346   BILIRUBINUR NEGATIVE 08/19/2020 2346   KETONESUR NEGATIVE 08/19/2020 2346   PROTEINUR NEGATIVE 08/19/2020 2346   NITRITE NEGATIVE 08/19/2020 2346   LEUKOCYTESUR TRACE (A) 08/19/2020 2346   Sepsis Labs Invalid input(s): PROCALCITONIN,  WBC,  LACTICIDVEN Microbiology Recent Results (from the past 240 hour(s))  CSF culture     Status: None   Collection Time: 08/21/20  8:43 AM   Specimen: PATH Cytology CSF; Cerebrospinal Fluid  Result Value Ref Range Status   Specimen Description   Final    CSF Performed at [facility], [address], [place], [place] [address]    Special Requests   Final    NONE Performed at [facility], [address] [place], [place] [address]    Gram Stain   Final    NO ORGANISMS SEEN WBC SEEN RBC SEEN Performed at [facility], [address] [place], [place] [address]    Culture   Final    NO GROWTH  3 DAYS Performed at [facility], [place] [address], [place], [place] [address]    Report Status 08/24/2020 FINAL  Final  Culture, fungus without smear     Status: None (Preliminary result)   Collection Time: 08/21/20  8:43 AM   Specimen: PATH Cytology CSF; Cerebrospinal Fluid  Result Value Ref Range Status   Specimen Description   Final    CSF Performed at [facility], [address] [place], [place] [address]    Special Requests   Final    NONE Performed at [facility], [address], [place], [place] [address]    Culture   Final    NO FUNGUS ISOLATED AFTER 9 DAYS Performed at [facility], [place] [address], [place], [place] [address]    Report Status PENDING  Incomplete  SARS CORONAVIRUS 2 (TAT 6-24 HRS) Nasopharyngeal Nasopharyngeal Swab     Status: None   Collection Time: 08/24/20  3:48 PM   Specimen:  Nasopharyngeal Swab  Result Value Ref Range Status   SARS Coronavirus 2 NEGATIVE NEGATIVE Final    Comment: (NOTE) SARS-CoV-2 target nucleic acids are NOT DETECTED.  The SARS-CoV-2 RNA is generally detectable in upper and lower respiratory specimens during the acute phase of infection. Negative results do not preclude SARS-CoV-2 infection, do not rule out co-infections with other pathogens, and should not be used as the sole basis for treatment or other patient management decisions. Negative results must be combined with clinical observations, patient history, and epidemiological information. The expected result is Negative.  Fact Sheet for Patients: SugarRoll.be  Fact Sheet for Healthcare Providers: https://www.woods-mathews.com/  This test is not yet approved or cleared by the [place] FDA and  has been authorized for detection and/or diagnosis of SARS-CoV-2 by FDA under an Emergency Use Authorization (EUA). This EUA will remain  in effect (meaning this test can be used) for the duration of the COVID-19 declaration under Se ction 564(b)(1) of the Act, 21 U.S.C. section 360bbb-3(b)(1), unless the authorization is terminated or revoked sooner.  Performed at [facility], [place] [address] [place], [place] [address]     Time coordinating discharge: Over 30 minutes  SIGNED:  Lorella Nimrod, MD  [facility] 08/30/2020, 2:47 PM  If 7PM-7AM, please contact night-coverage [email or web address]  This record has been created using Systems analyst. Errors have been sought and corrected,but may not always be located. Such creation errors do not reflect on the standard of care.

## 2020-08-24 NOTE — TOC Progression Note (Signed)
Transition of Care [facility]) - Progression Note    Patient Details  Name: Cheyenne Anthony MRN: [id number] Date of Birth: 1946/02/12  Transition of Care [facility]) CM/SW [address], RN Phone Number: 08/24/2020, 9:02 AM  Clinical Narrative:   RNCM reached back out to patient's daughter Cheyenne Anthony to follow up on whether or not she and her sister want patient placed back into facility or not. Cheyenne Anthony requests that patient's information be sent to the other facilities that will take [facility] patient's, [facility].  RNCM will verify PASSR, complete FL2 and send bed requests.          Expected Discharge Plan and Services                                                 Social Determinants of Health (SDOH) Interventions    Readmission Risk Interventions No flowsheet data found.

## 2020-08-24 NOTE — NC FL2 (Signed)
[place] LEVEL OF CARE SCREENING TOOL     IDENTIFICATION  Patient Name: Cheyenne Anthony Birthdate: Nov 11, 1945 Sex: female Admission Date (Current Location): 08/19/2020  [place] and [place] Number:  Engineering geologist and Address:  [facility], [address], [place], [place] [address]      Provider Number: [id number]  Attending Physician Name and Address:  Lorella Nimrod, MD  Relative Name and Phone Number:  Elise Benne [phone]    Current Level of Care: Hospital Recommended Level of Care: [place] Prior Approval Number:    Date Approved/Denied:   PASRR Number: [id number]  Discharge Plan: SNF    Current Diagnoses: Patient Active Problem List   Diagnosis Date Noted  . Glaucoma 08/21/2020  . Obesity (BMI 30-39.9) 08/21/2020  . Low serum cortisol level ([place]) 08/21/2020  . S/P craniotomy 08/20/2020  . 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 Possible Seizure ([place]) 08/20/2020  . 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 Cerebellar mass   . Lung mass   . Hypokalemia 07/31/2020  . Acute metabolic encephalopathy 84/69/6295  . Abnormal CXR with 5 cm round opacity in RML 07/31/2020  . LBBB (left bundle branch block) 07/31/2020  . Elevated troponin 07/31/2020  . HLD (hyperlipidemia) 07/31/2020  . Depression 07/31/2020  . Hypertension   . Type II diabetes mellitus with renal manifestations (HCC)     Orientation RESPIRATION BLADDER Height & Weight     Self,Time  Normal External catheter Weight: 79.4 kg Height:  5\' 4"  (162.6 cm)  BEHAVIORAL SYMPTOMS/MOOD NEUROLOGICAL BOWEL NUTRITION STATUS      Continent Diet (Carb Modified)  AMBULATORY STATUS COMMUNICATION OF NEEDS Skin   Extensive Assist Verbally Surgical wounds                       Personal Care Assistance Level of Assistance  Bathing,Feeding,Dressing Bathing Assistance: Maximum assistance Feeding  assistance: Limited assistance Dressing Assistance: Maximum assistance     Functional Limitations Info             SPECIAL CARE FACTORS FREQUENCY  PT (By licensed PT),OT (By licensed OT)                    Contractures Contractures Info: Not present    Additional Factors Info  Code Status,Allergies Code Status Info: Full Allergies Info: No known allergies           Current Medications (08/24/2020):  This is the current hospital active medication list Current Facility-Administered Medications  Medication Dose Route Frequency Provider Last Rate Last Admin  . 0.9 %  sodium chloride infusion  75 mL/hr Intravenous Continuous Athena Masse, MD 75 mL/hr at 08/23/20 1027 75 mL/hr at 08/23/20 1027  . acetaminophen (TYLENOL) tablet 650 mg  650 mg Oral Q6H PRN Athena Masse, MD   650 mg at 08/22/20 1837   Or  . acetaminophen (TYLENOL) suppository 650 mg  650 mg Rectal Q6H PRN Athena Masse, MD      . DULoxetine (CYMBALTA) DR capsule 60 mg  60 mg Oral Daily Annita Brod, MD   60 mg at 08/23/20 1010  . hydrocortisone (CORTEF) tablet 10 mg  10 mg Oral Daily Annita Brod, MD   10 mg at 08/24/20 0757  . hydrocortisone (CORTEF) tablet 5 mg  5 mg Oral Daily Lorella Nimrod, MD   5 mg at 08/23/20 1500  .  insulin aspart (novoLOG) injection 0-15 Units  0-15 Units Subcutaneous TID WC Lorella Nimrod, MD   2 Units at 08/24/20 0800  . insulin aspart (novoLOG) injection 0-5 Units  0-5 Units Subcutaneous QHS Amin, Soundra Pilon, MD      . insulin glargine (LANTUS) injection 5 Units  5 Units Subcutaneous QHS Annita Brod, MD   5 Units at 08/23/20 2300  . labetalol (NORMODYNE) injection 20 mg  20 mg Intravenous Q3H PRN Mansy, Jan A, MD      . latanoprost (XALATAN) 0.005 % ophthalmic solution 1 drop  1 drop Both Eyes QHS Annita Brod, MD   1 drop at 08/23/20 2300  . LORazepam (ATIVAN) injection 1-2 mg  1-2 mg Intravenous Q2H PRN Athena Masse, MD      . metoprolol succinate  (TOPROL-XL) 24 hr tablet 50 mg  50 mg Oral Daily Annita Brod, MD   50 mg at 08/24/20 0815  . ondansetron (ZOFRAN) tablet 4 mg  4 mg Oral Q6H PRN Athena Masse, MD       Or  . ondansetron [facility]) injection 4 mg  4 mg Intravenous Q6H PRN Athena Masse, MD         Discharge Medications: Please see discharge summary for a list of discharge medications.  Relevant Imaging Results:  Relevant Lab Results:   Additional Information SS# [id number]  Shelbie Ammons, RN

## 2020-08-25 ENCOUNTER — Encounter: Payer: Self-pay | Admitting: Internal Medicine

## 2020-08-25 LAB — GLUCOSE, CAPILLARY
Glucose-Capillary: 135 mg/dL — ABNORMAL HIGH (ref 70–99)
Glucose-Capillary: 162 mg/dL — ABNORMAL HIGH (ref 70–99)

## 2020-08-25 LAB — CULTURE, BLOOD (ROUTINE X 2)
Culture: NO GROWTH
Culture: NO GROWTH

## 2020-08-25 LAB — SARS CORONAVIRUS 2 (TAT 6-24 HRS): SARS Coronavirus 2: NEGATIVE

## 2020-08-25 MED ORDER — HYDROCORTISONE 10 MG PO TABS: 10.0000 mg | ORAL_TABLET | Freq: Every day | ORAL | Status: DC

## 2020-08-25 MED ORDER — HYDROCORTISONE 5 MG PO TABS: 5.0000 mg | ORAL_TABLET | Freq: Every day | ORAL | Status: DC

## 2020-08-25 NOTE — Discharge Summary (Signed)
Physician Discharge Summary  Cheyenne Anthony SAY:[id number] DOB: [birth year] DOA: 08/19/2020  PCP: [place] date: 08/19/2020 Discharge date: 08/25/2020  Discharge disposition: SNF   Recommendations for Outpatient Follow-Up:   1. Follow up with endocrinologist as an outpatient in 2 to 3 weeks. 2. Follow up with physician at the nursing home within 3 days. 3. Follow up with neurologist in 2 to 3 weeks.   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  Diet recommendation:  Diet Order            Diet - low sodium heart healthy           Diet Carb Modified Fluid consistency: Thin; Room service appropriate? Yes  Diet effective now                   Code Status: Full Code     Hospital Course:   Ms. Cheyenne Anthony is a 75 year old female with past medical history of diabetes, hypertension and recently diagnosed lung cancer with cerebellar brain metastases status post suboccipital craniotomy on 1/5 at [facility] discharged to skilled nursing on 1/12 and sent to the emergency room on 1/22 from the nursing facility for confusion. In the emergency room, noted to have a rectal temperature of 100.6 and MRI noted a small acute to subacute ischemic infarct along margins of resection cavity with small 2 to 3 cm subdural hematoma over the left occipital condyle with no appreciable residual or local recurrent tumor. Neurosurgery consulted who felt that findings appear to be more in line with patient's recent surgical procedure and recommended neurology consult and EEG. She was admitted to the  hospitalist service. Patient noted to have elevated lactic acid level on admission so started on broad-spectrum antibiotics as concerns for meningitis.However, meningitis was eventually ruled out.   Following admission, patient much more alert and interactive,and was back to baseline by day 2. She was seen by neurologist. Afte clearance from neurosurgery, LP was done on 1/24 by fluoroscopy.  CSF with high RBC and white cells, lymphocytic predominant, increased protein, cultures remain negative, more consistent with reactive with recent surgery.  EEG was done and it was negative for any seizure-like activity.  There was some concern of staring spells.  Neurology does not want to start any antiseizure medications and can consider if she continued to have more spells.  No more spells noted.  There was some concern of paraneoplastic syndrome/Lambert-Eaton syndrome due to bilateral lower extremity weakness, paraneoplastic labs along with calcium channel antibodies were sent by neurology.  According to neurology note it seems more like deconditioning.  Mentation improved and she appears to be at her baseline now.        Acute metabolic encephalopathy.  Resolved. Patient had abnormal CT head findings with history of metastatic lung cancer with cerebellar mets s/p post craniotomy. CSF cultures were negative.   -No obvious source of infection so sepsis ruled out.  Bilateral lower extremity weakness.  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  Neurology was consulted and labs along with  calcium channel antibodies were sent. -Follow-up lab results -Neurology thinks that it might be due to physical deconditioning. -PT/OT evaluation-recommending SNF placement.   Low cortisol level. Apparently she was discharged on Decadron with a tapering dose for 5 days from [place].  According to pace representative she did got dose tapering doses. -Continue Cortef 10 mg in the  morning and 5 mg in afternoon.  Concern of staring spells.  EEG was negative for any epileptiform activity. Per neurology no need for any antiepileptic medications-can be considered if continue to have those spells.  Hypokalemia.  Resolved  Lactic acidosis.  Resolved.  Patient was also on Metformin and poor p.o. intake.  Type 2 diabetes mellitus with renal manifestations.  -Continue with Lantus and SSI  History of depression.  No acute concern. -Continue with Cymbalta.  Glaucoma. -Continue with home eyedrops.                  Medical Consultants:    Neurologist  Neurosurgeon  Infectious disease   Discharge Exam:    Vitals:   08/24/20 2209 08/24/20 2344 08/25/20 0619 08/25/20 0751  BP: 139/80 111/77 119/84 130/76  Pulse: 92 92 92 91  Resp: 18 18 16 20   Temp: 99.2 F (37.3 C) 98.3 F (36.8 C) 99.3 F (37.4 C) 99.3 F (37.4 C)  TempSrc:    Oral  SpO2: 98% 96% 94% 100%  Weight:      Height:         GEN: NAD SKIN: Warm and dry EYES: No pallor or icterus ENT: MMM CV: RRR PULM: CTA B ABD: soft, obese, NT, +BS CNS: AAO x 3, non focal EXT: No edema. Chronic tenderness right foot   The results of significant diagnostics from this hospitalization (including imaging, microbiology, ancillary and laboratory) are listed below for reference.     Procedures and Diagnostic Studies:   DG Chest 2 View  Result Date: 08/20/2020 CLINICAL DATA:  Fever and altered mental status. EXAM: CHEST - 2 VIEW COMPARISON:  07/31/2020 FINDINGS: Shallow inspiration. Heart size and pulmonary vascularity are normal. Mass or rounded area of consolidation in the right middle lung measuring 5.5 cm diameter, unchanged since prior studies. Left lung is clear. No pleural effusions. No pneumothorax. Calcification of the aorta. Degenerative changes in the spine and shoulders. IMPRESSION: Right middle lobe mass is unchanged. No developing consolidation or edema. Electronically  Signed   By: Lucienne Capers M.D.   On: 08/20/2020 00:24   CT Head Wo Contrast  Result Date: 08/19/2020 CLINICAL DATA:  Delirium. EXAM: CT HEAD WITHOUT CONTRAST TECHNIQUE: Contiguous axial images were obtained from the base of the skull through the vertex without intravenous contrast. COMPARISON:  July 31, 2020 FINDINGS: Brain: Mass of right cerebellum is unchanged compared to prior exam. 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 Mild chronic bilateral periventricular white matter small vessel ischemic changes are noted. There is no acute hemorrhage, midline shift, or hydrocephalus. Vascular: No hyperdense vessel is noted. Skull: Status post prior right occipital craniectomy unchanged. Sinuses/Orbits: No acute finding. Other: None. IMPRESSION: 1. No acute hemorrhage or infarct identified. 2. Mass of right cerebellum is unchanged compared to prior exam. 3. 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 Electronically Signed   By: Abelardo Diesel M.D.   On: 08/19/2020 14:02   MR Brain W and Wo Contrast  Result Date: 08/20/2020 CLINICAL DATA:  Initial evaluation for acute altered mental status. EXAM: MRI HEAD WITHOUT AND WITH CONTRAST  TECHNIQUE: Multiplanar, multiecho pulse sequences of the brain and surrounding structures were obtained without and with intravenous contrast. CONTRAST:  7.56mL GADAVIST GADOBUTROL 1 MMOL/ML IV SOLN COMPARISON:  Comparison made with prior head CT from 08/19/2020 as well as previous CT from 07/31/2020. FINDINGS: Brain: Postoperative changes from recent right suboccipital craniotomy for resection of a right cerebellar mass are seen. Postoperative changes in blood products present within the resection cavity at the right cerebellum. Previously seen vasogenic edema within the right cerebellum and posterior fossa is markedly improved as compared to previous CT from 07/31/2020, with wide patency of the fourth ventricle now seen. Small  amount of residual vasogenic edema is seen within the right cerebellum (series 16, image 9). The tumor itself has been largely resected, with no convincing evidence for residual or locally recurrent tumor at this time, although small tumor could conceivably be obscured by residual postoperative T1 hyperintensity within this region. Note is made of a small curvilinear area of infarction along the margin of the resection cavity (series 5, images 11, 10). Normal expected postoperative dural thickening and enhancement seen about the cerebellum and overlying the right cerebral hemisphere. There is a small subdural hematoma overlying the left occipital convexity measuring no more than 2-3 mm in maximal thickness (series 9, image 13). No significant mass effect. Single punctate focus of possible enhancement noted at the posterior left frontotemporal region (series 20, image 14), nonspecific, but could reflect a tiny metastatic implant versus vascular focus of enhancement. No other convincing evidence for metastatic disease elsewhere within the brain. No other abnormal enhancement or mass lesion. Linear diffusion signal abnormality seen along the course of a prior ventriculostomy tract at the anterior right frontal lobe (series 5, image 34). No other evidence for acute or subacute ischemia elsewhere within the brain. Chronic right MCA distribution infarct involving the right frontal operculum noted. Underlying moderate chronic microvascular ischemic disease. No midline shift or hydrocephalus. Pituitary gland and suprasellar region within normal limits. Midline structures intact. Vascular: Major intracranial vascular flow voids are grossly maintained at the skull base, although evaluation limited by motion artifact. Skull and upper cervical spine: Craniocervical junction within normal limits. Bone marrow signal intensity normal. No focal marrow replacing lesion. Post craniotomy changes present at the right suboccipital  region without adverse features. Sinuses/Orbits: Globes and orbital soft tissues within normal limits. Scattered mucosal thickening noted within the left ethmoidal air cells and left maxillary sinus. Paranasal sinuses are otherwise largely clear. Trace right mastoid effusion noted, of doubtful significance. Other: None. IMPRESSION: 1. Postoperative changes from recent right suboccipital craniotomy for resection of a right cerebellar mass. No appreciable residual or locally recurrent tumor. Previously seen vasogenic edema within the right cerebellum is markedly improved, with only mild edema now seen within this region. 2. Small acute to subacute ischemic infarct along the margin of the resection cavity as above. 3. 2-3 mm subdural hematoma overlying the left occipital convexity without significant mass effect, presumably postoperative. 4. Single punctate focus of possible enhancement involving the posterior left frontotemporal region, nonspecific, but could reflect a tiny metastatic implant versus vascular focus of enhancement. Attention at follow-up recommended. 5. No other acute intracranial abnormality. 6. 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 Electronically Signed   By: Jeannine Boga M.D.   On: 08/20/2020 03:55   US Venous Img Lower Bilateral (DVT)  Result Date: 08/20/2020 CLINICAL DATA:  Leg pain/swelling EXAM: BILATERAL LOWER EXTREMITY VENOUS DOPPLER ULTRASOUND TECHNIQUE: Gray-scale sonography with compression, as  well as color and duplex ultrasound, were performed to evaluate the deep venous system(s) from the level of the common femoral vein through the popliteal and proximal calf veins. COMPARISON:  None. FINDINGS: VENOUS LEFT: Nonocclusive thrombus within the profunda femoral vein. Common femoral vein, saphenofemoral vein, femoral and popliteal veins are patent. Evaluation of the calf veins are limited due to edema. RIGHT: Normal  compressibility of the common femoral, superficial femoral, and popliteal veins, as well as the visualized calf veins. Visualized portions of profunda femoral vein and great saphenous vein unremarkable. No filling defects to suggest DVT on grayscale or color Doppler imaging. Doppler waveforms show normal direction of venous flow, normal respiratory plasticity and response to augmentation. OTHER None. Limitations: none IMPRESSION: Nonocclusive thrombus within the left profunda femoral vein. Otherwise patent. No evidence of deep venous thrombosis on the right. Electronically Signed   By: Julian Hy M.D.   On: 08/20/2020 15:08   MR TOTAL SPINE METS SCREENING  Result Date: 08/20/2020 CLINICAL DATA:  Myelopathy, acute or progressive Paraspinal mass/tumor Neck pain, acute, known malignancy EXAM: MRI TOTAL SPINE WITHOUT AND WITH CONTRAST TECHNIQUE: Multisequence MR imaging of the spine from the cervical spine to the sacrum was performed prior to and following IV contrast administration for evaluation of spinal metastatic disease. CONTRAST:  7.58mL GADAVIST GADOBUTROL 1 MMOL/ML IV SOLN COMPARISON:  08/20/2020 and prior. FINDINGS: MRI CERVICAL SPINE FINDINGS Alignment: Physiologic. Vertebrae: Normal bone marrow signal intensity. No focal osseous lesion. Cord: Normal signal and morphology. Posterior Fossa, vertebral arteries: Please see same day MRI for findings above the craniocervical junction. Disc levels: Mild multilevel desiccation. Posterior protrusions/disc bulge at the C2-3, C5-6 and C6-7 levels with no greater than mild spinal canal narrowing. No cord impingement. Paraspinal tissues: Negative. MRI THORACIC SPINE FINDINGS Alignment:  Normal. Vertebrae: Normal bone marrow signal intensity. No focal osseous lesions. Cord:  Normal signal and morphology. Paraspinal and other soft tissues: Negative. Disc levels: Small posterior protrusions the T6-T10 level. No significant spinal canal narrowing or cord  impingement. MRI LUMBAR SPINE FINDINGS Segmentation:  Standard. Alignment:  Grade 1 L4-5 anterolisthesis. Vertebrae: No focal osseous lesion. Vertebral body heights are preserved. Degenerative related inflammatory changes involving the facet joints at the L4-S1 level. Conus medullaris and cauda equina: Conus extends to the L1 level. Conus and cauda equina appear grossly normal. Disc levels: Multilevel desiccation. Small posterior protrusions/discs bulge at the L2-S1 levels with no greater than mild spinal canal narrowing. No impingement of the conus or cauda equina. Paraspinal and other soft tissues: Negative. Please note that image quality is degraded by motion artifact. IMPRESSION: No evidence of metastases. No high-grade spinal canal narrowing or cord impingement. Multilevel spondylosis. Electronically Signed   By: Primitivo Gauze M.D.   On: 08/20/2020 14:41     Labs:   Basic Metabolic Panel: Recent Labs  Lab 08/19/20 1255 08/20/20 1014 08/21/20 0432 08/22/20 0553 08/23/20 0515  NA 138 138 140 140 144  K 2.8* 3.6 3.3* 2.9* 3.6  CL 99 103 106 107 111  CO2 24 23 22 22 22   GLUCOSE 218* 267* 213* 168* 136*  BUN 28* 18 18 14 10   CREATININE 0.68 0.53 0.54 0.51 0.49  CALCIUM 9.3 8.8* 8.8* 8.5* 9.0  MG 1.8 1.7  --  1.7  --   PHOS  --  2.3*  --   --   --    GFR Estimated Creatinine Clearance: 63.9 mL/min (by C-G formula based on SCr of 0.49 mg/dL). Liver Function Tests: Recent  Labs  Lab 08/19/20 1255 08/20/20 1014 08/22/20 0553  AST 19 16 18   ALT 11 12 12   ALKPHOS 56 56 54  BILITOT 1.2 0.9 0.7  PROT 6.9 6.7 5.9*  ALBUMIN 3.2* 3.1* 2.6*   No results for input(s): LIPASE, AMYLASE in the last 168 hours. Recent Labs  Lab 08/22/20 0553  AMMONIA 17   Coagulation profile Recent Labs  Lab 08/19/20 1255 08/21/20 0432  INR 1.0 1.1    CBC: Recent Labs  Lab 08/19/20 1255 08/20/20 1014 08/21/20 0432 08/22/20 0553 08/23/20 0515  WBC 10.3 8.0 13.2* 11.0* 9.4  NEUTROABS   --  6.7  --   --   --   HGB 10.8* 10.2* 10.2* 9.8* 9.8*  HCT 33.2* 31.7* 31.0* 30.7* 30.8*  MCV 86.7 87.1 85.6 86.7 87.0  PLT 345 292 288 252 237   Cardiac Enzymes: No results for input(s): CKTOTAL, CKMB, CKMBINDEX, TROPONINI in the last 168 hours. BNP: Invalid input(s): POCBNP CBG: Recent Labs  Lab 08/24/20 1123 08/24/20 1222 08/24/20 1641 08/24/20 2206 08/25/20 0749  GLUCAP 167* 141* 117* 160* 135*   D-Dimer No results for input(s): DDIMER in the last 72 hours. Hgb A1c No results for input(s): HGBA1C in the last 72 hours. Lipid Profile No results for input(s): CHOL, HDL, LDLCALC, TRIG, CHOLHDL, LDLDIRECT in the last 72 hours. Thyroid function studies No results for input(s): TSH, T4TOTAL, T3FREE, THYROIDAB in the last 72 hours.  Invalid input(s): FREET3 Anemia work up No results for input(s): VITAMINB12, FOLATE, FERRITIN, TIBC, IRON, RETICCTPCT in the last 72 hours. Microbiology Recent Results (from the past 240 hour(s))  Urine Culture     Status: None   Collection Time: 08/19/20 11:46 PM   Specimen: Urine, Random  Result Value Ref Range Status   Specimen Description   Final    URINE, RANDOM Performed at [facility], [address] [place], [place] [address]    Special Requests   Final    NONE Performed at [facility], [address], [place], [place] [address]    Culture   Final    NO GROWTH Performed at [facility], [place] [address] [place], [place] [address]    Report Status 08/21/2020 FINAL  Final  SARS Coronavirus 2 by RT PCR (hospital order, performed in [facility] hospital lab) Nasopharyngeal Nasopharyngeal Swab     Status: None   Collection Time: 08/20/20 12:39 AM   Specimen: Nasopharyngeal Swab  Result Value Ref Range Status   SARS Coronavirus 2 NEGATIVE NEGATIVE Final    Comment: (NOTE) SARS-CoV-2 target nucleic acids are NOT DETECTED.  The SARS-CoV-2 RNA is generally detectable in upper and lower respiratory  specimens during the acute phase of infection. The lowest concentration of SARS-CoV-2 viral copies this assay can detect is 250 copies / mL. A negative result does not preclude SARS-CoV-2 infection and should not be used as the sole basis for treatment or other patient management decisions.  A negative result may occur with improper specimen collection / handling, submission of specimen other than nasopharyngeal swab, presence of viral mutation(s) within the areas targeted by this assay, and inadequate number of viral copies (<250 copies / mL). A negative result must be combined with clinical observations, patient history, and epidemiological information.  Fact Sheet for Patients:   StrictlyIdeas.no  Fact Sheet for Healthcare Providers: BankingDealers.co.za  This test is not yet approved or  cleared by the [place] FDA and has been authorized for detection and/or diagnosis  of SARS-CoV-2 by FDA under an Emergency Use Authorization (EUA).  This EUA will remain in effect (meaning this test can be used) for the duration of the COVID-19 declaration under Section 564(b)(1) of the Act, 21 U.S.C. section 360bbb-3(b)(1), unless the authorization is terminated or revoked sooner.  Performed at [facility], [address] [place], [place] [address]   Culture, blood (Routine X 2) w Reflex to ID Panel     Status: None   Collection Time: 08/20/20 12:39 AM   Specimen: BLOOD  Result Value Ref Range Status   Specimen Description BLOOD BLOOD RIGHT HAND  Final   Special Requests   Final    BOTTLES DRAWN AEROBIC AND ANAEROBIC Blood Culture results may not be optimal due to an excessive volume of blood received in culture bottles   Culture   Final    NO GROWTH 5 DAYS Performed at [facility] [address] [place], [place] [address]    Report Status 08/25/2020 FINAL  Final  Culture, blood (Routine X 2) w Reflex to ID Panel      Status: None   Collection Time: 08/20/20 12:39 AM   Specimen: BLOOD  Result Value Ref Range Status   Specimen Description BLOOD BLOOD LEFT WRIST  Final   Special Requests   Final    BOTTLES DRAWN AEROBIC AND ANAEROBIC Blood Culture results may not be optimal due to an excessive volume of blood received in culture bottles   Culture   Final    NO GROWTH 5 DAYS Performed at [facility], [address] [place], [place] [address]    Report Status 08/25/2020 FINAL  Final  MRSA PCR Screening     Status: None   Collection Time: 08/20/20 12:43 PM   Specimen: Nasopharyngeal  Result Value Ref Range Status   MRSA by PCR NEGATIVE NEGATIVE Final    Comment:        The GeneXpert MRSA Assay (FDA approved for NASAL specimens only), is one component of a comprehensive MRSA colonization surveillance program. It is not intended to diagnose MRSA infection nor to guide or monitor treatment for MRSA infections. Performed at [facility], [address] [place], [place] [address]   CSF culture     Status: None   Collection Time: 08/21/20  8:43 AM   Specimen: PATH Cytology CSF; Cerebrospinal Fluid  Result Value Ref Range Status   Specimen Description   Final    CSF Performed at [facility], [address] [place], [place] [address]    Special Requests   Final    NONE Performed at [facility], [address] [place], [place] [address]    Gram Stain   Final    NO ORGANISMS SEEN WBC SEEN RBC SEEN Performed at [facility], [address] [place], [place] [address]    Culture   Final    NO GROWTH 3 DAYS Performed at [facility], [place] [address] [place], [place] [address]    Report Status 08/24/2020 FINAL  Final  Culture, fungus without smear     Status: None (Preliminary result)   Collection Time: 08/21/20  8:43 AM   Specimen: PATH Cytology CSF; Cerebrospinal Fluid  Result Value Ref Range Status   Specimen Description    Final    CSF Performed at [facility], [address] [place], [place] [address]    Special Requests   Final    NONE Performed at [facility], [place], [place] [address]    Culture   Final    No Fungi Isolated in 4 Weeks Performed at [facility] [address], [place], [place] [address]    Report Status PENDING  Incomplete  SARS CORONAVIRUS 2 (TAT 6-24 HRS) Nasopharyngeal Nasopharyngeal Swab     Status: None   Collection Time: 08/24/20  3:48 PM   Specimen: Nasopharyngeal Swab  Result Value Ref Range Status   SARS Coronavirus 2 NEGATIVE NEGATIVE Final    Comment: (NOTE) SARS-CoV-2 target nucleic acids are NOT DETECTED.  The SARS-CoV-2 RNA is generally detectable in upper and lower respiratory specimens during the acute phase of infection. Negative results do not preclude SARS-CoV-2 infection, do not rule out co-infections with other pathogens, and should not be used as the sole basis for treatment or other patient management decisions. Negative results must be combined with clinical observations, patient history, and epidemiological information. The expected result is Negative.  Fact Sheet for Patients: SugarRoll.be  Fact Sheet for Healthcare Providers: https://www.woods-mathews.com/  This test is not yet approved or cleared by the [place] FDA and  has been authorized for detection and/or diagnosis of SARS-CoV-2 by FDA under an Emergency Use Authorization (EUA). This EUA will remain  in effect (meaning this test can be used) for the duration of the COVID-19 declaration under Se ction 564(b)(1) of the Act, 21 U.S.C. section 360bbb-3(b)(1), unless the authorization is terminated or revoked sooner.  Performed at [facility], [place] [address], [place], [place] [address]      Discharge Instructions:   Discharge Instructions    Diet - low sodium heart healthy   Complete by:  As directed    Increase activity slowly   Complete by: As directed      Allergies as of 08/25/2020   No Known Allergies     Medication List    TAKE these medications   Acetaminophen Extra Strength 500 MG tablet Generic drug: acetaminophen Take 1,000 mg by mouth every 8 (eight) hours.   atorvastatin 40 MG tablet Commonly known as: LIPITOR Take 40 mg by mouth daily.   DULoxetine 60 MG capsule Commonly known as: CYMBALTA Take 60 mg by mouth daily.   hydrocortisone 10 MG tablet Commonly known as: CORTEF Take 1 tablet (10 mg total) by mouth daily. With Breakfast   hydrocortisone 5 MG tablet Commonly known as: CORTEF Take 1 tablet (5 mg total) by mouth daily. At 2 PM   latanoprost 0.005 % ophthalmic solution Commonly known as: XALATAN Place 1 drop into both eyes at bedtime.   lisinopril-hydrochlorothiazide 20-25 MG tablet Commonly known as: ZESTORETIC Take 1 tablet by mouth daily.   metoprolol succinate 50 MG 24 hr tablet Commonly known as: TOPROL-XL Take 50 mg by mouth daily. Take with or immediately following a meal.   MiraLax 17 g packet Generic drug: polyethylene glycol Take 17 g by mouth daily.   NIFEdipine 90 MG 24 hr tablet Commonly known as: ADALAT CC Take 90 mg by mouth daily.   senna-docusate 8.6-50 MG tablet Commonly known as: Senokot-S Take 2 tablets by mouth in the morning and at bedtime.   sitaGLIPtin-metformin 50-1000 MG tablet Commonly known as: JANUMET Take 1 tablet by mouth 2 (two) times daily with a meal.       Contact information for after-discharge care    Destination    [facility] Preferred SNF .   Service: Skilled Nursing Contact information: [address]  [place] [phone]                   Time coordinating discharge: 33 minutes  Signed:  Jennye Boroughs  [facility] 08/25/2020, 9:56 AM   Pager on [email or web address]. If 7PM-7AM, please contact night-coverage at  [email or web address]

## 2020-08-25 NOTE — Progress Notes (Signed)
Attempted report to [facility] [phone] and no answer.

## 2020-08-28 ENCOUNTER — Telehealth: Payer: Self-pay | Admitting: *Deleted

## 2020-08-28 LAB — MISC LABCORP TEST (SEND OUT): Labcorp test code: 808970

## 2020-08-28 NOTE — Telephone Encounter (Signed)
Phone call made to pt's daughter to question if desired further oncology follow up with Dr. Janese Banks. Message left with pt's daughter to call back.

## 2020-08-31 LAB — MISC LABCORP TEST (SEND OUT): Labcorp test code: 813940

## 2020-09-11 LAB — CULTURE, FUNGUS WITHOUT SMEAR

## 2020-10-03 ENCOUNTER — Inpatient Hospital Stay
Admission: EM | Admit: 2020-10-03 | Discharge: 2020-10-16 | DRG: 064 | Disposition: A | Discharge status: Skilled Nursing Facility | Payer: Medicare (Managed Care) | Source: Skilled Nursing Facility | Attending: Internal Medicine | Admitting: Internal Medicine

## 2020-10-03 ENCOUNTER — Observation Stay: Payer: Medicare (Managed Care)

## 2020-10-03 ENCOUNTER — Emergency Department: Payer: Medicare (Managed Care)

## 2020-10-03 ENCOUNTER — Other Ambulatory Visit: Payer: Self-pay

## 2020-10-03 ENCOUNTER — Encounter: Payer: Self-pay | Admitting: Emergency Medicine

## 2020-10-03 DIAGNOSIS — R253 Fasciculation: Secondary | ICD-10-CM | POA: Diagnosis present

## 2020-10-03 DIAGNOSIS — I639 Cerebral infarction, unspecified: Secondary | ICD-10-CM | POA: Diagnosis present

## 2020-10-03 DIAGNOSIS — I6349 Cerebral infarction due to embolism of other cerebral artery: Secondary | ICD-10-CM | POA: Diagnosis not present

## 2020-10-03 DIAGNOSIS — E1129 Type 2 diabetes mellitus with other diabetic kidney complication: Secondary | ICD-10-CM | POA: Diagnosis present

## 2020-10-03 DIAGNOSIS — F0390 Unspecified dementia without behavioral disturbance: Secondary | ICD-10-CM | POA: Diagnosis present

## 2020-10-03 DIAGNOSIS — Z7984 Long term (current) use of oral hypoglycemic drugs: Secondary | ICD-10-CM

## 2020-10-03 DIAGNOSIS — E271 Primary adrenocortical insufficiency: Secondary | ICD-10-CM | POA: Diagnosis present

## 2020-10-03 DIAGNOSIS — G9341 Metabolic encephalopathy: Secondary | ICD-10-CM | POA: Diagnosis present

## 2020-10-03 DIAGNOSIS — Z86718 Personal history of other venous thrombosis and embolism: Secondary | ICD-10-CM

## 2020-10-03 DIAGNOSIS — L8990 Pressure ulcer of unspecified site, unspecified stage: Secondary | ICD-10-CM | POA: Insufficient documentation

## 2020-10-03 DIAGNOSIS — M170 Bilateral primary osteoarthritis of knee: Secondary | ICD-10-CM | POA: Diagnosis present

## 2020-10-03 DIAGNOSIS — E869 Volume depletion, unspecified: Secondary | ICD-10-CM | POA: Diagnosis present

## 2020-10-03 DIAGNOSIS — E785 Hyperlipidemia, unspecified: Secondary | ICD-10-CM | POA: Diagnosis present

## 2020-10-03 DIAGNOSIS — R52 Pain, unspecified: Secondary | ICD-10-CM

## 2020-10-03 DIAGNOSIS — C3431 Malignant neoplasm of lower lobe, right bronchus or lung: Secondary | ICD-10-CM | POA: Diagnosis present

## 2020-10-03 DIAGNOSIS — I2699 Other pulmonary embolism without acute cor pulmonale: Secondary | ICD-10-CM

## 2020-10-03 DIAGNOSIS — E870 Hyperosmolality and hypernatremia: Secondary | ICD-10-CM | POA: Diagnosis present

## 2020-10-03 DIAGNOSIS — E669 Obesity, unspecified: Secondary | ICD-10-CM | POA: Diagnosis present

## 2020-10-03 DIAGNOSIS — E232 Diabetes insipidus: Secondary | ICD-10-CM | POA: Diagnosis present

## 2020-10-03 DIAGNOSIS — Z79899 Other long term (current) drug therapy: Secondary | ICD-10-CM

## 2020-10-03 DIAGNOSIS — L89153 Pressure ulcer of sacral region, stage 3: Secondary | ICD-10-CM | POA: Diagnosis present

## 2020-10-03 DIAGNOSIS — M25571 Pain in right ankle and joints of right foot: Secondary | ICD-10-CM | POA: Diagnosis present

## 2020-10-03 DIAGNOSIS — Z87891 Personal history of nicotine dependence: Secondary | ICD-10-CM

## 2020-10-03 DIAGNOSIS — Z823 Family history of stroke: Secondary | ICD-10-CM

## 2020-10-03 DIAGNOSIS — G4089 Other seizures: Secondary | ICD-10-CM | POA: Diagnosis present

## 2020-10-03 DIAGNOSIS — I82409 Acute embolism and thrombosis of unspecified deep veins of unspecified lower extremity: Secondary | ICD-10-CM

## 2020-10-03 DIAGNOSIS — Z6832 Body mass index (BMI) 32.0-32.9, adult: Secondary | ICD-10-CM

## 2020-10-03 DIAGNOSIS — R251 Tremor, unspecified: Secondary | ICD-10-CM | POA: Diagnosis not present

## 2020-10-03 DIAGNOSIS — E876 Hypokalemia: Secondary | ICD-10-CM | POA: Diagnosis present

## 2020-10-03 DIAGNOSIS — I10 Essential (primary) hypertension: Secondary | ICD-10-CM | POA: Diagnosis present

## 2020-10-03 DIAGNOSIS — Z20822 Contact with and (suspected) exposure to covid-19: Secondary | ICD-10-CM | POA: Diagnosis present

## 2020-10-03 DIAGNOSIS — Z7901 Long term (current) use of anticoagulants: Secondary | ICD-10-CM

## 2020-10-03 DIAGNOSIS — R3589 Other polyuria: Secondary | ICD-10-CM | POA: Diagnosis present

## 2020-10-03 DIAGNOSIS — C7931 Secondary malignant neoplasm of brain: Secondary | ICD-10-CM | POA: Diagnosis present

## 2020-10-03 DIAGNOSIS — M25579 Pain in unspecified ankle and joints of unspecified foot: Secondary | ICD-10-CM

## 2020-10-03 DIAGNOSIS — E878 Other disorders of electrolyte and fluid balance, not elsewhere classified: Secondary | ICD-10-CM | POA: Diagnosis present

## 2020-10-03 DIAGNOSIS — C3490 Malignant neoplasm of unspecified part of unspecified bronchus or lung: Secondary | ICD-10-CM | POA: Diagnosis present

## 2020-10-03 DIAGNOSIS — Z86711 Personal history of pulmonary embolism: Secondary | ICD-10-CM

## 2020-10-03 DIAGNOSIS — N2889 Other specified disorders of kidney and ureter: Secondary | ICD-10-CM | POA: Diagnosis present

## 2020-10-03 DIAGNOSIS — D6859 Other primary thrombophilia: Secondary | ICD-10-CM | POA: Diagnosis present

## 2020-10-03 LAB — BASIC METABOLIC PANEL
Anion gap: 9 (ref 5–15)
Anion gap: 6 (ref 5–15)
BUN: 12 mg/dL (ref 8–23)
BUN: 12 mg/dL (ref 8–23)
CO2: 23 mmol/L (ref 22–32)
CO2: 25 mmol/L (ref 22–32)
Calcium: 9.3 mg/dL (ref 8.9–10.3)
Calcium: 9 mg/dL (ref 8.9–10.3)
Chloride: 119 mmol/L — ABNORMAL HIGH (ref 98–111)
Chloride: 122 mmol/L — ABNORMAL HIGH (ref 98–111)
Creatinine, Ser: 0.84 mg/dL (ref 0.44–1.00)
Creatinine, Ser: 0.81 mg/dL (ref 0.44–1.00)
GFR, Estimated: >60 mL/min (ref >60)
GFR, Estimated: >60 mL/min (ref >60)
Glucose, Bld: 137 mg/dL — ABNORMAL HIGH (ref 70–99)
Glucose, Bld: 121 mg/dL — ABNORMAL HIGH (ref 70–99)
Potassium: 3.7 mmol/L (ref 3.5–5.1)
Potassium: 3.6 mmol/L (ref 3.5–5.1)
Sodium: 151 mmol/L — ABNORMAL HIGH (ref 135–145)
Sodium: 153 mmol/L — ABNORMAL HIGH (ref 135–145)

## 2020-10-03 LAB — CBC WITH DIFFERENTIAL/PLATELET
Abs Immature Granulocytes: 0.07 10*3/uL (ref 0.00–0.07)
Basophils Absolute: 0 10*3/uL (ref 0.0–0.1)
Basophils Relative: 1 %
Eosinophils Absolute: 0.1 10*3/uL (ref 0.0–0.5)
Eosinophils Relative: 1 %
HCT: 35.7 % — ABNORMAL LOW (ref 36.0–46.0)
Hemoglobin: 10.4 g/dL — ABNORMAL LOW (ref 12.0–15.0)
Immature Granulocytes: 1 %
Lymphocytes Relative: 33 %
Lymphs Abs: 2.7 10*3/uL (ref 0.7–4.0)
MCH: 26.2 pg (ref 26.0–34.0)
MCHC: 29.1 g/dL — ABNORMAL LOW (ref 30.0–36.0)
MCV: 89.9 fL (ref 80.0–100.0)
Monocytes Absolute: 1 10*3/uL (ref 0.1–1.0)
Monocytes Relative: 11 %
Neutro Abs: 4.5 10*3/uL (ref 1.7–7.7)
Neutrophils Relative %: 53 %
Platelets: 498 10*3/uL — ABNORMAL HIGH (ref 150–400)
RBC: 3.97 MIL/uL (ref 3.87–5.11)
RDW: 17.5 % — ABNORMAL HIGH (ref 11.5–15.5)
WBC: 8.4 10*3/uL (ref 4.0–10.5)
nRBC: 0 % (ref 0.0–0.2)

## 2020-10-03 MED ORDER — SODIUM CHLORIDE 0.9 % IV BOLUS
500.0000 mL | Freq: Once | INTRAVENOUS | Status: AC
  Administered 2020-10-03: 500 mL via INTRAVENOUS

## 2020-10-03 MED ORDER — ACETAMINOPHEN 325 MG PO TABS
650.0000 mg | ORAL_TABLET | ORAL | Status: DC | PRN
  Administered 2020-10-04 – 2020-10-15 (×7): 650 mg via ORAL
  Filled 2020-10-03 (×7): qty 2

## 2020-10-03 MED ORDER — ACETAMINOPHEN 160 MG/5ML PO SOLN
650.0000 mg | ORAL | Status: DC | PRN
  Filled 2020-10-03: qty 20.3

## 2020-10-03 MED ORDER — ASPIRIN 300 MG RE SUPP
300.0000 mg | Freq: Every day | RECTAL | Status: DC
  Administered 2020-10-04: 300 mg via RECTAL
  Filled 2020-10-03: qty 1

## 2020-10-03 MED ORDER — LORAZEPAM 2 MG/ML IJ SOLN: 1.0000 mg | INTRAMUSCULAR | Status: DC | PRN

## 2020-10-03 MED ORDER — STROKE: EARLY STAGES OF RECOVERY BOOK: Freq: Once | Status: DC

## 2020-10-03 MED ORDER — ACETAMINOPHEN 650 MG RE SUPP: 650.0000 mg | RECTAL | Status: DC | PRN

## 2020-10-03 MED ORDER — ENOXAPARIN SODIUM 40 MG/0.4ML ~~LOC~~ SOLN
40.0000 mg | SUBCUTANEOUS | Status: DC
  Administered 2020-10-03 – 2020-10-04 (×2): 40 mg via SUBCUTANEOUS
  Filled 2020-10-03 (×2): qty 0.4

## 2020-10-03 MED ORDER — IOHEXOL 350 MG/ML SOLN
75.0000 mL | Freq: Once | INTRAVENOUS | Status: AC | PRN
  Administered 2020-10-03: 75 mL via INTRAVENOUS

## 2020-10-03 MED ORDER — ASPIRIN EC 325 MG PO TBEC: 325.0000 mg | DELAYED_RELEASE_TABLET | Freq: Every day | ORAL | Status: DC

## 2020-10-03 MED ORDER — SODIUM CHLORIDE 0.9 % IV SOLN: 75.0000 mL/h | INTRAVENOUS | Status: DC

## 2020-10-03 NOTE — H&P (Addendum)
History and Physical    [name] ERD:[id number] DOB: 1945-11-27 DOA: 10/03/2020  PCP: [place]   Patient coming from: [facility]  I have personally briefly reviewed patient's old medical records in [place]  Chief Complaint: tremor right arm and right face  HPI: Cheyenne Anthony is a 75 y.o. 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  Patient denied feeling weak in the arm face or leg, slurred speech or drooling, headache.  Has had no fevers or chills.  She had not been recently ill except for recent hospitalization. ED course: Patient awake and alert and oriented on arrival.  Temperature 99.2, BP 119/78, pulse 86 and respirations 16 with O2 sat 98% on room air.  Blood work significant for hemoglobin of 10.4 which is around her baseline.  BMP noted to have high sodium and chloride, otherwise unremarkable. EKG as reviewed by me: Sinus tachycardia at 100 with nonspecific ST-T wave changes Imaging: CT head "Gyriform hyperdensity in the right occipital lobe likely reflecting interval, now subacute infarction with laminar necrosis or petechial hemorrhage.Postoperative changes in the posterior fossa. 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 Changes."  Review of Systems: As per HPI otherwise all other systems on review of systems negative.    Past Medical History:   Diagnosis Date  . Brain mass 07/31/2020  . Cancer ([place])   . Hypertension   . Right lower lobe lung mass   . Type II diabetes mellitus with renal manifestations ([place])     History reviewed. No pertinent surgical history.   reports that she quit smoking about 2 months ago. Her smoking use included cigarettes. She has never used smokeless tobacco. She reports previous alcohol use. She reports that she does not use drugs.  No Known Allergies  Family History  Problem Relation Age of Onset  . Stroke Brother       Prior to Admission medications   Medication Sig Start Date End Date Taking? Authorizing Provider  ACETAMINOPHEN EXTRA STRENGTH 500 MG tablet Take 1,000 mg by mouth every 8 (eight) hours. 08/09/20   [provider]  atorvastatin (LIPITOR) 40 MG tablet Take 40 mg by mouth daily.    [provider]  DULoxetine (CYMBALTA) 60 MG capsule Take 60 mg by mouth daily.    [provider]  hydrocortisone (CORTEF) 10 MG tablet Take 1 tablet (10 mg total) by mouth daily. With Breakfast 08/25/20   Jennye Boroughs, MD  hydrocortisone (CORTEF) 5 MG tablet Take 1 tablet (5 mg total) by mouth daily. At 2 PM 08/25/20   Jennye Boroughs, MD  latanoprost (XALATAN) 0.005 % ophthalmic solution Place 1 drop into both eyes at bedtime.    [provider]  lisinopril-hydrochlorothiazide (ZESTORETIC) 20-25 MG tablet Take 1 tablet by mouth daily.    [provider]  metoprolol succinate (TOPROL-XL) 50 MG 24 hr tablet Take 50 mg by mouth daily. Take with or immediately following a meal.    [provider]  MIRALAX 17 g  packet Take 17 g by mouth daily. 08/09/20   [provider]  NIFEdipine (ADALAT CC) 90 MG 24 hr tablet Take 90 mg by mouth daily.    [provider]  senna-docusate (SENOKOT-S) 8.6-50 MG tablet Take 2 tablets by mouth in the morning and at bedtime. 08/09/20   [provider]  sitaGLIPtin-metformin (JANUMET) 50-1000 MG  tablet Take 1 tablet by mouth 2 (two) times daily with a meal.    [provider]    Physical Exam: Vitals:   10/03/20 1808 10/03/20 1809 10/03/20 1845 10/03/20 1900  BP: 119/78  115/66 111/65  Pulse: 86  94 96  Resp: 16  17 19   Temp: 99.2 F (37.3 C)     TempSrc: Oral     SpO2: 98%  99% 97%  Weight: 59 kg     Height: 5\' 2"  (1.575 m) 5\' 4"  (1.626 m)       Vitals:   10/03/20 1808 10/03/20 1809 10/03/20 1845 10/03/20 1900  BP: 119/78  115/66 111/65  Pulse: 86  94 96  Resp: 16  17 19   Temp: 99.2 F (37.3 C)     TempSrc: Oral     SpO2: 98%  99% 97%  Weight: 59 kg     Height: 5\' 2"  (1.575 m) 5\' 4"  (1.626 m)        Constitutional: Alert and oriented x 3 . Not in any apparent distress HEENT:      Head: Normocephalic and atraumatic.         Eyes: PERLA, EOMI, Conjunctivae are normal. Sclera is non-icteric.       Mouth/Throat: Mucous membranes are moist.       Neck: Supple with no signs of meningismus. Cardiovascular: Regular rate and rhythm. No murmurs, gallops, or rubs. 2+ symmetrical distal pulses are present . No JVD. No LE edema Respiratory: Respiratory effort normal .Lungs sounds clear bilaterally. No wheezes, crackles, or rhonchi.  Gastrointestinal: Soft, non tender, and non distended with positive bowel sounds.  Genitourinary: No CVA tenderness. Musculoskeletal: Nontender with normal range of motion in all extremities. No cyanosis, or erythema of extremities. Neurologic: Twitching noted on right side of face as well as right arm, some blunting of right nasolabial fold.   Moving all extremities equally, but minimally reduced strength on right  skin: Skin is warm, dry.  No rash or ulcers Psychiatric: Mood and affect are normal    Labs on Admission: I have personally reviewed following labs and imaging studies  CBC: Recent Labs  Lab 10/03/20 1818  WBC 8.4  NEUTROABS 4.5  HGB 10.4*  HCT 35.7*  MCV 89.9  PLT 865*   Basic Metabolic Panel: Recent Labs   Lab 10/03/20 1818  NA 151*  K 3.7  CL 119*  CO2 23  GLUCOSE 137*  BUN 12  CREATININE 0.84  CALCIUM 9.3   GFR: Estimated Creatinine Clearance: 51.5 mL/min (by C-G formula based on SCr of 0.84 mg/dL). Liver Function Tests: No results for input(s): AST, ALT, ALKPHOS, BILITOT, PROT, ALBUMIN in the last 168 hours. No results for input(s): LIPASE, AMYLASE in the last 168 hours. No results for input(s): AMMONIA in the last 168 hours. Coagulation Profile: No results for input(s): INR, PROTIME in the last 168 hours. Cardiac Enzymes: No results for input(s): CKTOTAL, CKMB, CKMBINDEX, TROPONINI in the last 168 hours. BNP (last 3 results) No results for input(s): PROBNP in the last 8760 hours. HbA1C: No results for input(s): HGBA1C in the last 72 hours. CBG:  No results for input(s): GLUCAP in the last 168 hours. Lipid Profile: No results for input(s): CHOL, HDL, LDLCALC, TRIG, CHOLHDL, LDLDIRECT in the last 72 hours. Thyroid Function Tests: No results for input(s): TSH, T4TOTAL, FREET4, T3FREE, THYROIDAB in the last 72 hours. Anemia Panel: No results for input(s): VITAMINB12, FOLATE, FERRITIN, TIBC, IRON, RETICCTPCT in the last 72 hours. Urine analysis:    Component Value Date/Time   COLORURINE YELLOW (A) 08/19/2020 2346   APPEARANCEUR HAZY (A) 08/19/2020 2346   LABSPEC 1.025 08/19/2020 2346   PHURINE 5.0 08/19/2020 2346   GLUCOSEU NEGATIVE 08/19/2020 2346   HGBUR NEGATIVE 08/19/2020 2346   BILIRUBINUR NEGATIVE 08/19/2020 2346   KETONESUR NEGATIVE 08/19/2020 2346   PROTEINUR NEGATIVE 08/19/2020 2346   NITRITE NEGATIVE 08/19/2020 2346   LEUKOCYTESUR TRACE (A) 08/19/2020 2346    Radiological Exams on Admission: CT Head Wo Contrast  Result Date: 10/03/2020 CLINICAL DATA:  Shaking, history of brain tumor EXAM: CT HEAD WITHOUT CONTRAST TECHNIQUE: Contiguous axial images were obtained from the base of the skull through the vertex without intravenous contrast. COMPARISON:   08/19/2020 FINDINGS: Brain: Chronic right MCA territory infarction involving the frontal lobe and insula. There is gyriform hyperdensity in the right occipital lobe. Hypoattenuation likely reflecting surgical cavity is present in the right cerebellum underlying craniectomy and cranioplasty without mass effect. Limited evaluation for tumor on this study. Prominence of the ventricles and sulci reflects generalized parenchymal volume loss. Additional patchy hypoattenuation in the supratentorial white matter is nonspecific but probably reflects stable chronic microvascular ischemic changes. Vascular: No hyperdense vessel.There is atherosclerotic calcification at the skull base. Skull: Right lateral suboccipital craniectomy with cranioplasty. Small right frontal burr hole. Sinuses/Orbits: No acute finding. Other: None. IMPRESSION: Gyriform hyperdensity in the right occipital lobe likely reflecting interval, now subacute infarction with laminar necrosis or petechial hemorrhage. Postoperative changes in the posterior fossa. 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 changes. Electronically Signed   By: Macy Mis M.D.   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    Acute/ subacute CVA  on CT head   Episode of shaking/ Possible seizure    History of suboccipital craniotomy for cerebellar mets 07/2020 at [facility] -Patient with episodic shaking, uncertain whether a seizure but had negative EEG during her hospitalization from 1/22-2/22 -CT head showing possible subacute infarct in the right occipital lobe -Follow-up MRI for better evaluation -Continue atorvastatin.  Med list does not have an antiplatelet -Start aspirin.  No contraindication identified -Permissive hypertension -Neurology consult -PT, OT SLP and TOC consult -Addendum: MRI showing left precentral gyrus acute ischemic stroke -Continue the above.  We will repeat EEG  History of lung cancer, metastatic to cerebellum status post craniotomy -No acute issues    Hypertension -We will hold antihypertensives for permissive hypertension. 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 cortisol level  Hypernatremia and hyperchloremia -Recheck BMP with similar findings, sodium over 150 -IV hydration with D5     DVT prophylaxis: Lovenox  Code Status: full code  Family Communication:  none  Disposition Plan: Back to previous home environment Consults called: neurology Status:observation    Athena Masse MD [facility]     10/03/2020, 7:59 PM

## 2020-10-03 NOTE — ED Notes (Signed)
Pt in MRI.

## 2020-10-03 NOTE — ED Triage Notes (Signed)
Pt to ED via  EMS from [facility]. Per EMS, family stated that when pt was getting changed at the facility they noticed twitching in pt arms and mouth. Pt has hx of brain cancer, which was removed. Pt had radiation tx two weeks ago for lung cancer. Pt is weak on right side due to hx of stroke. Pt has been staying at [facility] for failure to thrive. EMS vitals; BP135/78, HR 100, O2 sat 99% RA, CBG 143.

## 2020-10-03 NOTE — ED Provider Notes (Signed)
[facility] Emergency Department Provider Note   ____________________________________________   I have reviewed the triage vital signs and the nursing notes.   HISTORY  Chief Complaint Shaking   History limited by: Not Limited   HPI Cheyenne Anthony is a 75 y.o. female who presents to the emergency department today from [facility] because of concerns for right arm shaking as well as some facial shaking as well.  Apparently family notices while they were with the patient [facility] while they were changed.  Patient is aware that she is shaking however denies any pain with it.  She denies any headache.  She states she has had shaking in the past although has never been seen for it.  She denies any recent fevers or illness.   Records reviewed. Per medical record review patient has a history of brain cancer s/p resections, possible seizure.   Past Medical History:  Diagnosis Date  . Brain mass 07/31/2020  . Cancer ([place])   . Hypertension   . Right lower lobe lung mass   . Type II diabetes mellitus with renal manifestations [facility])     Patient Active Problem List   Diagnosis Date Noted  . Glaucoma 08/21/2020  . Obesity (BMI 30-39.9) 08/21/2020  . Low serum cortisol level ([place]) 08/21/2020  . S/P craniotomy 08/20/2020  . Lung cancer with cerebellar metastases s/p craniotomy 08/02/20 at [facility] 08/20/2020  . Possible Seizure ([place]) 08/20/2020  . 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 Cerebellar mass   . Lung mass   . Hypokalemia 07/31/2020  . Acute metabolic encephalopathy 15/17/6160  . Abnormal CXR with 5 cm round opacity in RML 07/31/2020  . LBBB (left bundle branch block) 07/31/2020  . Elevated troponin 07/31/2020  . HLD (hyperlipidemia) 07/31/2020  . Depression 07/31/2020  . Hypertension   . Type II diabetes mellitus with renal manifestations ([place])     History reviewed. No pertinent surgical history.  Prior to Admission medications    Medication Sig Start Date End Date Taking? Authorizing Provider  ACETAMINOPHEN EXTRA STRENGTH 500 MG tablet Take 1,000 mg by mouth every 8 (eight) hours. 08/09/20   [provider]  atorvastatin (LIPITOR) 40 MG tablet Take 40 mg by mouth daily.    [provider]  DULoxetine (CYMBALTA) 60 MG capsule Take 60 mg by mouth daily.    [provider]  hydrocortisone (CORTEF) 10 MG tablet Take 1 tablet (10 mg total) by mouth daily. With Breakfast 08/25/20   Jennye Boroughs, MD  hydrocortisone (CORTEF) 5 MG tablet Take 1 tablet (5 mg total) by mouth daily. At 2 PM 08/25/20   Jennye Boroughs, MD  latanoprost (XALATAN) 0.005 % ophthalmic solution Place 1 drop into both eyes at bedtime.    [provider]  lisinopril-hydrochlorothiazide (ZESTORETIC) 20-25 MG tablet Take 1 tablet by mouth daily.    [provider]  metoprolol succinate (TOPROL-XL) 50 MG 24 hr tablet Take 50 mg by mouth daily. Take with or immediately following a meal.    [provider]  MIRALAX 17 g packet Take 17 g by mouth daily. 08/09/20   [provider]  NIFEdipine (ADALAT CC) 90 MG 24 hr tablet Take 90 mg by mouth daily.    [provider]  senna-docusate (SENOKOT-S) 8.6-50 MG tablet Take 2 tablets by mouth in the morning and at bedtime. 08/09/20   [provider]  sitaGLIPtin-metformin (JANUMET) 50-1000 MG tablet Take 1 tablet by mouth 2 (two) times daily  with a meal.    [provider]    Allergies Patient has no known allergies.  Family History  Problem Relation Age of Onset  . Stroke Brother     Social History Social History   Tobacco Use  . Smoking status: Former Smoker    Types: Cigarettes    Quit date: 07/31/2020    Years since quitting: 0.1  . Smokeless tobacco: Never Used  Substance Use Topics  . Alcohol use: Not Currently    Comment: occ  . Drug use: Never    Review of Systems Constitutional: No fever/chills Eyes: No visual  changes. ENT: No sore throat. Cardiovascular: Denies chest pain. Respiratory: Denies shortness of breath. Gastrointestinal: No abdominal pain.  No nausea, no vomiting.  No diarrhea.   Genitourinary: Negative for dysuria. Musculoskeletal: Negative for back pain. Skin: Negative for rash. Neurological: Positive for shaking of left arm and face.  ____________________________________________   PHYSICAL EXAM:  VITAL SIGNS: ED Triage Vitals [10/03/20 1808]  Enc Vitals Group     BP 119/78     Pulse Rate 86     Resp 16     Temp 99.2 F (37.3 C)     Temp Source Oral     SpO2 98 %     Weight 130 lb (59 kg)     Height 5\' 2"  (1.575 m)     Head Circumference      [facility]      Pain Score 0   Constitutional: Alert and oriented.  Eyes: Conjunctivae are normal.  ENT      Head: Normocephalic and atraumatic.      Nose: No congestion/rhinnorhea.      Mouth/Throat: Mucous membranes are moist.      Neck: No stridor. Hematological/Lymphatic/Immunilogical: No cervical lymphadenopathy. Cardiovascular: Normal rate, regular rhythm.  No murmurs, rubs, or gallops.  Respiratory: Normal respiratory effort without tachypnea nor retractions. Breath sounds are clear and equal bilaterally. No wheezes/rales/rhonchi. Gastrointestinal: Soft and non tender. No rebound. No guarding.  Genitourinary: Deferred Musculoskeletal: Normal range of motion in all extremities. No lower extremity edema. Neurologic:  Normal speech and language. No gross focal neurologic deficits are appreciated.  Skin:  Skin is warm, dry and intact. No rash noted. Psychiatric: Mood and affect are normal. Speech and behavior are normal. Patient exhibits appropriate insight and judgment.  ____________________________________________    LABS (pertinent positives/negatives)  BMP na 151, k 3.7, cl 119, glu 137, cr 0.84 CBC wbc 8.4, hgb 10.4, plt 498  ____________________________________________   EKG  I, Nance Pear,  attending physician, personally viewed and interpreted this EKG  EKG Time: 1809 Rate: 100 Rhythm: sinus tachycardia Axis: left axis deviation Intervals: qtc 479 QRS: IVCD ST changes: no st elevation Impression: abnormal ekg  ____________________________________________    RADIOLOGY  CT head Concern for subacute CVA in right occipital lobe  ____________________________________________   PROCEDURES  Procedures  ____________________________________________   INITIAL IMPRESSION / ASSESSMENT AND PLAN / ED COURSE  Pertinent labs & imaging results that were available during my care of the patient were reviewed by me and considered in my medical decision making (see chart for details).   Patient presented from [facility] today because of concerns for shaking to the right upper extremity and face that was noticed by family while she was changing.  On initial exam patient did have some intermittent shaking of the right upper extremity and right face.  Blood work showed some slight elevation of sodium and chloride levels.  Head CT was obtained which was concerning for a subacute CVA.  I discussed this with the patient.  Will plan on admission to the hospital service for further work-up and evaluation. ____________________________________________   FINAL CLINICAL IMPRESSION(S) / ED DIAGNOSES  Final diagnoses:  Shaking  Cerebrovascular accident (CVA), unspecified mechanism ([place])     Note: This dictation was prepared with Dragon dictation. Any transcriptional errors that result from this process are unintentional     Nance Pear, MD 10/03/20 1932

## 2020-10-04 ENCOUNTER — Inpatient Hospital Stay (HOSPITAL_COMMUNITY)
Admit: 2020-10-04 | Discharge: 2020-10-04 | Disposition: A | Discharge status: Home / Self Care | Payer: Medicare (Managed Care) | Attending: Neurology | Admitting: Neurology

## 2020-10-04 DIAGNOSIS — Z87891 Personal history of nicotine dependence: Secondary | ICD-10-CM | POA: Diagnosis not present

## 2020-10-04 DIAGNOSIS — I10 Essential (primary) hypertension: Secondary | ICD-10-CM | POA: Diagnosis present

## 2020-10-04 DIAGNOSIS — C3431 Malignant neoplasm of lower lobe, right bronchus or lung: Secondary | ICD-10-CM | POA: Diagnosis present

## 2020-10-04 DIAGNOSIS — I6389 Other cerebral infarction: Secondary | ICD-10-CM

## 2020-10-04 DIAGNOSIS — E870 Hyperosmolality and hypernatremia: Secondary | ICD-10-CM | POA: Diagnosis present

## 2020-10-04 DIAGNOSIS — R251 Tremor, unspecified: Secondary | ICD-10-CM

## 2020-10-04 DIAGNOSIS — I6349 Cerebral infarction due to embolism of other cerebral artery: Secondary | ICD-10-CM | POA: Diagnosis present

## 2020-10-04 DIAGNOSIS — Z7984 Long term (current) use of oral hypoglycemic drugs: Secondary | ICD-10-CM | POA: Diagnosis not present

## 2020-10-04 DIAGNOSIS — C7931 Secondary malignant neoplasm of brain: Secondary | ICD-10-CM | POA: Diagnosis present

## 2020-10-04 DIAGNOSIS — I639 Cerebral infarction, unspecified: Secondary | ICD-10-CM | POA: Diagnosis not present

## 2020-10-04 DIAGNOSIS — R569 Unspecified convulsions: Secondary | ICD-10-CM | POA: Diagnosis not present

## 2020-10-04 DIAGNOSIS — Z86711 Personal history of pulmonary embolism: Secondary | ICD-10-CM | POA: Diagnosis not present

## 2020-10-04 DIAGNOSIS — F0390 Unspecified dementia without behavioral disturbance: Secondary | ICD-10-CM | POA: Diagnosis present

## 2020-10-04 DIAGNOSIS — E669 Obesity, unspecified: Secondary | ICD-10-CM | POA: Diagnosis present

## 2020-10-04 DIAGNOSIS — I63412 Cerebral infarction due to embolism of left middle cerebral artery: Secondary | ICD-10-CM

## 2020-10-04 DIAGNOSIS — C3490 Malignant neoplasm of unspecified part of unspecified bronchus or lung: Secondary | ICD-10-CM | POA: Diagnosis not present

## 2020-10-04 DIAGNOSIS — L89153 Pressure ulcer of sacral region, stage 3: Secondary | ICD-10-CM | POA: Diagnosis present

## 2020-10-04 DIAGNOSIS — L8990 Pressure ulcer of unspecified site, unspecified stage: Secondary | ICD-10-CM | POA: Insufficient documentation

## 2020-10-04 DIAGNOSIS — Z20822 Contact with and (suspected) exposure to covid-19: Secondary | ICD-10-CM | POA: Diagnosis present

## 2020-10-04 DIAGNOSIS — G4089 Other seizures: Secondary | ICD-10-CM | POA: Diagnosis present

## 2020-10-04 DIAGNOSIS — E878 Other disorders of electrolyte and fluid balance, not elsewhere classified: Secondary | ICD-10-CM | POA: Diagnosis present

## 2020-10-04 DIAGNOSIS — Z86718 Personal history of other venous thrombosis and embolism: Secondary | ICD-10-CM | POA: Diagnosis not present

## 2020-10-04 DIAGNOSIS — E271 Primary adrenocortical insufficiency: Secondary | ICD-10-CM | POA: Diagnosis present

## 2020-10-04 DIAGNOSIS — I2699 Other pulmonary embolism without acute cor pulmonale: Secondary | ICD-10-CM | POA: Diagnosis not present

## 2020-10-04 DIAGNOSIS — E785 Hyperlipidemia, unspecified: Secondary | ICD-10-CM | POA: Diagnosis present

## 2020-10-04 DIAGNOSIS — Z79899 Other long term (current) drug therapy: Secondary | ICD-10-CM | POA: Diagnosis not present

## 2020-10-04 DIAGNOSIS — Z823 Family history of stroke: Secondary | ICD-10-CM | POA: Diagnosis not present

## 2020-10-04 DIAGNOSIS — E232 Diabetes insipidus: Secondary | ICD-10-CM | POA: Diagnosis present

## 2020-10-04 DIAGNOSIS — Z7901 Long term (current) use of anticoagulants: Secondary | ICD-10-CM | POA: Diagnosis not present

## 2020-10-04 DIAGNOSIS — Z6832 Body mass index (BMI) 32.0-32.9, adult: Secondary | ICD-10-CM | POA: Diagnosis not present

## 2020-10-04 DIAGNOSIS — D6859 Other primary thrombophilia: Secondary | ICD-10-CM | POA: Diagnosis present

## 2020-10-04 DIAGNOSIS — G9341 Metabolic encephalopathy: Secondary | ICD-10-CM | POA: Diagnosis present

## 2020-10-04 LAB — BASIC METABOLIC PANEL
Anion gap: 7 (ref 5–15)
BUN: 12 mg/dL (ref 8–23)
CO2: 26 mmol/L (ref 22–32)
Calcium: 9.5 mg/dL (ref 8.9–10.3)
Chloride: 129 mmol/L — ABNORMAL HIGH (ref 98–111)
Creatinine, Ser: 0.73 mg/dL (ref 0.44–1.00)
GFR, Estimated: >60 mL/min (ref >60)
Glucose, Bld: 138 mg/dL — ABNORMAL HIGH (ref 70–99)
Potassium: 3.6 mmol/L (ref 3.5–5.1)
Sodium: 162 mmol/L (ref 135–145)

## 2020-10-04 LAB — HEMOGLOBIN A1C
Hgb A1c MFr Bld: 7.4 % — ABNORMAL HIGH (ref 4.8–5.6)
Mean Plasma Glucose: 165.68 mg/dL

## 2020-10-04 LAB — URINALYSIS, COMPLETE (UACMP) WITH MICROSCOPIC
Bilirubin Urine: NEGATIVE
Glucose, UA: NEGATIVE mg/dL
Ketones, ur: NEGATIVE mg/dL
Leukocytes,Ua: NEGATIVE
Nitrite: NEGATIVE
Protein, ur: NEGATIVE mg/dL
Specific Gravity, Urine: 1.006 (ref 1.005–1.030)
pH: 6 (ref 5.0–8.0)

## 2020-10-04 LAB — SARS CORONAVIRUS 2 (TAT 6-24 HRS): SARS Coronavirus 2: NEGATIVE

## 2020-10-04 LAB — LIPID PANEL
Cholesterol: 89 mg/dL (ref 0–200)
HDL: 40 mg/dL — ABNORMAL LOW (ref >40)
LDL Cholesterol: 22 mg/dL (ref 0–99)
Total CHOL/HDL Ratio: 2.2 RATIO
Triglycerides: 134 mg/dL (ref <150)
VLDL: 27 mg/dL (ref 0–40)

## 2020-10-04 LAB — ECHOCARDIOGRAM COMPLETE
AR max vel: 2.94 cm2
AV Area VTI: 4.2 cm2
AV Area mean vel: 2.95 cm2
AV Mean grad: 4 mmHg
AV Peak grad: 6.1 mmHg
Ao pk vel: 1.23 m/s
Area-P 1/2: 7.29 cm2
Height: 64 in
S' Lateral: 2.04 cm
Weight: 2992 oz

## 2020-10-04 LAB — CORTISOL-AM, BLOOD: Cortisol - AM: 17.5 ug/dL (ref 6.7–22.6)

## 2020-10-04 LAB — GLUCOSE, CAPILLARY: Glucose-Capillary: 173 mg/dL — ABNORMAL HIGH (ref 70–99)

## 2020-10-04 MED ORDER — LEVETIRACETAM IN NACL 500 MG/100ML IV SOLN
500.0000 mg | Freq: Two times a day (BID) | INTRAVENOUS | Status: DC
  Administered 2020-10-04 – 2020-10-13 (×17): 500 mg via INTRAVENOUS
  Filled 2020-10-04 (×19): qty 100

## 2020-10-04 MED ORDER — SODIUM CHLORIDE 0.9 % IV SOLN
2000.0000 mg | Freq: Once | INTRAVENOUS | Status: AC
  Administered 2020-10-04: 2000 mg via INTRAVENOUS
  Filled 2020-10-04: qty 20

## 2020-10-04 MED ORDER — DEXTROSE 5 % IV SOLN: INTRAVENOUS | Status: DC

## 2020-10-04 MED ORDER — DESMOPRESSIN ACE SPRAY REFRIG 0.01 % NA SOLN
1.0000 | Freq: Every day | NASAL | Status: DC
  Administered 2020-10-05: 1 via NASAL
  Filled 2020-10-04: qty 5

## 2020-10-04 NOTE — Consult Note (Signed)
Stroke Neurology Consultation Note  Consult Requested by: Dr. Reesa Chew  Reason for Consult: right facial and right UE twitching  Consult Date: 10/04/20   The history was obtained from the chart.  During history and examination, all items were not able to obtain unless otherwise noted.  History of Present Illness:  Cheyenne Anthony is a 75 y.o. African American female with PMH of DM, HTN, lung cancer metastasis to right cerebellum s/p resection, DVT/PE on eliquis, SNF resident admitted for right facial twitching and right hand shaking.  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  She was sent to SNF and readmitted on 1/22-2/17 for altered mental status and confusion.  Neurology was involved at that time.  Work-up including LP, EEG, paraneoplastic work-up all negative.  Eventually, patient was felt to have low cortisol level/adrenal crisis, she was treated with steroids and improved.  She was also found to have left femoral vein nonocclusive DVT.  She was sent back to SNF.  Readmitted to [facility] 09/14/2020 for altered mental status and hypotension.  Found to have bilateral PE RV strain.  DVT showed positive left popliteal near occlusive DVT.  She was put on heparin IV and then transition to Eliquis on discharge.  TEE showed possible PFO.  She was also found to have renal mass pending further evaluation.  Yesterday she was in the SNF, when she was getting changed, she was found to have twitching around right arm and right face.  Otherwise no change from baseline.  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  Neurology was consulted for further evaluation  LSN: Unclear, sometimes yesterday afternoon tPA Given: No: Unclear time onset  Past Medical History:  Diagnosis Date  . Brain mass 07/31/2020  . Cancer ([place])   . Hypertension   .  Right lower lobe lung mass   . Type II diabetes mellitus with renal manifestations ([place])     History reviewed. No pertinent surgical history.  Family History  Problem Relation Age of Onset  . Stroke Brother     Social History:  reports that she quit smoking about 2 months ago. Her smoking use included cigarettes. She has never used smokeless tobacco. She reports previous alcohol use. She reports that she does not use drugs.  Allergies: No Known Allergies  No current facility-administered medications on file prior to encounter.   Current Outpatient Medications on File Prior to Encounter  Medication Sig Dispense Refill  . apixaban (ELIQUIS) 5 MG TABS tablet Take 5 mg by mouth every 12 (twelve) hours.    Marland Kitchen atorvastatin (LIPITOR) 40 MG tablet Take 40 mg by mouth daily.    . DULoxetine (CYMBALTA) 60 MG capsule Take 60 mg by mouth daily.    Marland Kitchen latanoprost (XALATAN) 0.005 % ophthalmic solution Place 1 drop into both eyes at bedtime.    . potassium chloride SA (KLOR-CON) 20 MEQ tablet Take 20 mEq by mouth daily.    Marland Kitchen senna-docusate (SENOKOT-S) 8.6-50 MG tablet Take 2 tablets by mouth in the morning and at bedtime.    . sitaGLIPtin-metformin (JANUMET) 50-500 MG tablet Take 1 tablet by mouth in the morning and at bedtime.    . traMADol (ULTRAM) 50 MG tablet Take 50 mg by mouth 3 (three) times daily between meals as needed for pain.    . ACETAMINOPHEN EXTRA STRENGTH 500 MG tablet  Take 1,000 mg by mouth every 8 (eight) hours. (Patient not taking: Reported on 10/04/2020)    . hydrocortisone (CORTEF) 10 MG tablet Take 1 tablet (10 mg total) by mouth daily. With Breakfast (Patient not taking: Reported on 10/04/2020)    . hydrocortisone (CORTEF) 5 MG tablet Take 1 tablet (5 mg total) by mouth daily. At 2 PM (Patient not taking: Reported on 10/04/2020)    . lisinopril-hydrochlorothiazide (ZESTORETIC) 20-25 MG tablet Take 1 tablet by mouth daily. (Patient not taking: Reported on 10/04/2020)    . metoprolol  succinate (TOPROL-XL) 50 MG 24 hr tablet Take 50 mg by mouth daily. Take with or immediately following a meal. (Patient not taking: Reported on 10/04/2020)    . MIRALAX 17 g packet Take 17 g by mouth daily. (Patient not taking: Reported on 10/04/2020)    . NIFEdipine (ADALAT CC) 90 MG 24 hr tablet Take 90 mg by mouth daily. (Patient not taking: Reported on 10/04/2020)      Review of Systems: A full ROS was attempted today and was not able to be performed as pt not orientated.  Physical Examination: Temp:  [98.5 F (36.9 C)-99.2 F (37.3 C)] 98.8 F (37.1 C) (03/09 0917) Pulse Rate:  [86-107] 107 (03/09 0917) Resp:  [16-70] 20 (03/09 0917) BP: (111-156)/(65-97) 139/86 (03/09 0917) SpO2:  [92 %-99 %] 92 % (03/09 0917) Weight:  [59 kg-84.8 kg] 84.8 kg (03/09 0444)  General - well nourished, well developed, in no apparent distress.    Ophthalmologic - fundi not visualized due to noncooperation.    Cardiovascular - regular rhythm and rate  Neuro - awake alert, however, disorientated, not able to tell me her age, place and time. Paucity of speech, limited speech output, moderate dysarthria, able to name 3/4 and able to repeat sentences. Follows limited simple commands.  Blinking to visual threat on the right consistently but less consistent on the left.  Able to track bilaterally, no gaze palsy.  Right nasolabial fold flattening, however frequent right facial twitching more at right corner of the mouth, as well as, bilateral frontal area.  Tongue midline.  Bilateral upper extremity 4/5, no drift, however moderate asterixis bilaterally.  Bilateral lower extremity significant pain on movement or tactile stimulation.0/5 proximally, 2-3/5 toe movement bilaterally. DTR BUE 1+, but BLE not able to perform due to pain. Sensation light touch subjectively symmetrical. FTN grossly intact bilaterally, gait not tested.   Data Reviewed: CT ANGIOGRAM HEAD NECK W WO CONTRAST  Result Date: 10/03/2020 CLINICAL DATA:   Stroke follow-up. Right arm shaking. EXAM: CT ANGIOGRAPHY HEAD AND NECK TECHNIQUE: Multidetector CT imaging of the head and neck was performed using the standard protocol during bolus administration of intravenous contrast. Multiplanar CT image reconstructions and MIPs were obtained to evaluate the vascular anatomy. Carotid stenosis measurements (when applicable) are obtained utilizing NASCET criteria, using the distal internal carotid diameter as the denominator. CONTRAST:  26mL OMNIPAQUE IOHEXOL 350 MG/ML SOLN COMPARISON:  None. FINDINGS: CTA NECK FINDINGS SKELETON: There is no bony spinal canal stenosis. No lytic or blastic lesion. OTHER NECK: Normal pharynx, larynx and major salivary glands. No cervical lymphadenopathy. Unremarkable thyroid gland. UPPER CHEST: No pneumothorax or pleural effusion. No nodules or masses. AORTIC ARCH: There is calcific atherosclerosis of the aortic arch. There is no aneurysm, dissection or hemodynamically significant stenosis of the visualized portion of the aorta. Conventional 3 vessel aortic branching pattern. The visualized proximal subclavian arteries are widely patent. RIGHT CAROTID SYSTEM: No dissection, occlusion or aneurysm. There is  predominantly calcified atherosclerosis extending into the proximal ICA, resulting in approximately 70% stenosis. LEFT CAROTID SYSTEM: No dissection, occlusion or aneurysm. There is calcific atherosclerosis extending into the proximal ICA, resulting in less than 50% stenosis. VERTEBRAL ARTERIES: Right dominant configuration. Both origins are clearly patent. There is no dissection, occlusion or flow-limiting stenosis to the skull base (V1-V3 segments). CTA HEAD FINDINGS POSTERIOR CIRCULATION: --Vertebral arteries: Normal V4 segments. --Inferior cerebellar arteries: Normal. --Basilar artery: Normal. --Superior cerebellar arteries: Normal. --Posterior cerebral arteries (PCA): Normal. ANTERIOR CIRCULATION: --Intracranial internal carotid arteries:  Atherosclerotic calcification of the internal carotid arteries at the skull base with mild-to-moderate stenosis worse at the distal right cavernous segment. --Anterior cerebral arteries (ACA): Normal. Both A1 segments are present. Patent anterior communicating artery (a-comm). --Middle cerebral arteries (MCA): Normal. VENOUS SINUSES: As permitted by contrast timing, patent. ANATOMIC VARIANTS: None Review of the MIP images confirms the above findings. IMPRESSION: 1. No intracranial arterial occlusion or high-grade stenosis. 2. 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 Aortic Atherosclerosis (ICD10-I70.0). Electronically Signed   By: Ulyses Jarred M.D.   On: 10/03/2020 23:49   CT Head Wo Contrast  Result Date: 10/03/2020 CLINICAL DATA:  Shaking, history of brain tumor EXAM: CT HEAD WITHOUT CONTRAST TECHNIQUE: Contiguous axial images were obtained from the base of the skull through the vertex without intravenous contrast. COMPARISON:  08/19/2020 FINDINGS: Brain: Chronic right MCA territory infarction involving the frontal lobe and insula. There is gyriform hyperdensity in the right occipital lobe. Hypoattenuation likely reflecting surgical cavity is present in the right cerebellum underlying craniectomy and cranioplasty without mass effect. Limited evaluation for tumor on this study. Prominence of the ventricles and sulci reflects generalized parenchymal volume loss. Additional patchy hypoattenuation in the supratentorial white matter is nonspecific but probably reflects stable chronic microvascular ischemic changes. Vascular: No hyperdense vessel.There is atherosclerotic calcification at the skull base. Skull: Right lateral suboccipital craniectomy with cranioplasty. Small right frontal burr hole. Sinuses/Orbits: No acute finding. Other: None. IMPRESSION: Gyriform hyperdensity in the right occipital lobe likely reflecting  interval, now subacute infarction with laminar necrosis or petechial hemorrhage. Postoperative changes in the posterior fossa. 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 changes. Electronically Signed   By: Macy Mis M.D.   On: 10/03/2020 19:03   MR BRAIN WO CONTRAST  Result Date: 10/03/2020 CLINICAL DATA:  Tremor EXAM: MRI HEAD WITHOUT CONTRAST TECHNIQUE: Multiplanar, multiecho pulse sequences of the brain and surrounding structures were obtained without intravenous contrast. COMPARISON:  08/20/2020 FINDINGS: Brain: There is a small acute infarct within the left frontal lobe along the left precentral gyrus. There are old infarcts of the right frontal operculum and right occipital lobe. There are postsurgical changes of right cerebellum. Chronic blood products at the right cerebellum There is multifocal hyperintense T2-weighted signal within the white matter. Generalized volume loss without a clear lobar predilection. The midline structures are normal. Vascular: Major flow voids are preserved. Skull and upper cervical spine: Right retromastoid cranioplasty. Sinuses/Orbits:No paranasal sinus fluid levels or advanced mucosal thickening. No mastoid or middle ear effusion. Normal orbits. IMPRESSION: 1. Small acute infarct of the left precentral gyrus. No hemorrhage or mass effect. 2. Old infarcts of the right frontal operculum and right occipital lobe. Electronically Signed   By: Ulyses Jarred M.D.   On: 10/03/2020 21:43   US Venous Img Lower Bilateral (DVT)  Result Date: 10/04/2020 CLINICAL DATA:  History of DVT EXAM: BILATERAL LOWER EXTREMITY VENOUS DOPPLER ULTRASOUND TECHNIQUE:  Gray-scale sonography with graded compression, as well as color Doppler and duplex ultrasound were performed to evaluate the lower extremity deep venous systems from the level of the common femoral vein and including the common femoral, femoral, profunda femoral, popliteal and calf veins including the  posterior tibial, peroneal and gastrocnemius veins when visible. The superficial great saphenous vein was also interrogated. Spectral Doppler was utilized to evaluate flow at rest and with distal augmentation maneuvers in the common femoral, femoral and popliteal veins. COMPARISON:  None. FINDINGS: RIGHT LOWER EXTREMITY Common Femoral Vein: No evidence of thrombus. Normal compressibility, respiratory phasicity and response to augmentation. Saphenofemoral Junction: No evidence of thrombus. Normal compressibility and flow on color Doppler imaging. Profunda Femoral Vein: No evidence of thrombus. Normal compressibility and flow on color Doppler imaging. Femoral Vein: No evidence of thrombus. Normal compressibility, respiratory phasicity and response to augmentation. Popliteal Vein: No evidence of thrombus. Normal compressibility, respiratory phasicity and response to augmentation. Calf Veins: No evidence of thrombus. Normal compressibility and flow on color Doppler imaging. Superficial Great Saphenous Vein: No evidence of thrombus. Normal compressibility. Venous Reflux:  None. Other Findings:  None. LEFT LOWER EXTREMITY Common Femoral Vein: No evidence of thrombus. Normal compressibility, respiratory phasicity and response to augmentation. Saphenofemoral Junction: No evidence of thrombus. Normal compressibility and flow on color Doppler imaging. Profunda Femoral Vein: No evidence of thrombus. Normal compressibility and flow on color Doppler imaging. Femoral Vein: No evidence of thrombus. Normal compressibility, respiratory phasicity and response to augmentation. Popliteal Vein: No evidence of thrombus. Normal compressibility, respiratory phasicity and response to augmentation. Calf Veins: No evidence of thrombus. Normal compressibility and flow on color Doppler imaging. Superficial Great Saphenous Vein: No evidence of thrombus. Normal compressibility. Venous Reflux:  None. Other Findings:  None. IMPRESSION: No evidence  of deep venous thrombosis in either lower extremity. Electronically Signed   By: Inez Catalina M.D.   On: 10/04/2020 00:29    Assessment: 75 y.o. 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 On exam, pt still has prominent right facial twitching but no right arm shaking, however, b/l UE at least moderate asterixis. BLE significant pain. 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  CTA head and neck showed significant b/l ICA bulb and siphon atherosclerosis with stenosis, R>L with 70% right ICA bulb stenosis. MRI showed small left precentral gyrus infarct. DVT negative. LDL 22 and A1C 7.4. 2D echo and EEG pending  Etiology for pt new stroke could be hypercoagulable state with advance cancer and extra- and intracranial stenosis. Given the small stroke, will resume her eliquis once pass swallow. Will also recommend to add ASA for atherosclerosis management. Currently on ASA PR. Continue statin as LDL at goal once po access.   Pt new stroke at left precentral gyrus fits to right facial and arm twitching/shaking, concerning for focal seizure. Will follow up with EEG. Will also load with keppra.   Stroke Risk Factors - diabetes mellitus, hypertension and hypercoagulable state  Plan: - EEG pending - load with keppra and followed by keppra 500 bid - resume eliquis and statin once po access - on ASA PR 300 now, once eliquis resumed, change to ASA 81 po.  - PT consult, OT consult, Speech consult - Echocardiogram pending - Risk factor modification -  Telemetry monitoring - Frequent neuro checks - will follow  Thank you for this consultation and allowing [place] to participate in the care of this patient.  Rosalin Hawking, MD PhD Stroke Neurology 10/04/2020 11:14  AM

## 2020-10-04 NOTE — Evaluation (Addendum)
**Note De-Identified Cheyenne Obfuscation** Clinical/Bedside Swallow Evaluation Patient Details  Name: Cheyenne Anthony MRN: [id number] Date of Birth: 1945-09-29  Today's Date: 10/04/2020 Time: SLP Start Time (ACUTE ONLY): 44 SLP Stop Time (ACUTE ONLY): 1145 SLP Time Calculation (min) (ACUTE ONLY): 55 min  Past Medical History:  Past Medical History:  Diagnosis Date  . Brain mass 07/31/2020  . Cancer ([place])   . Hypertension   . Right lower lobe lung mass   . Type II diabetes mellitus with renal manifestations [facility])    Past Surgical History: History reviewed. No pertinent surgical history. HPI:  Pt is a 75 y.o. 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  Patient denied feeling weak in the arm face or leg, or slurred speech or drooling, headache.  Has had no fevers or chills.  She had not been recently ill except for recent hospitalization.  ED course: Patient awake and alert and oriented on arrival.  Temperature 99.2, BP 119/78, pulse 86 and respirations 16 with O2 sat 98% on room air.  Blood work significant for hemoglobin of 10.4 which is around her baseline.  BMP noted to have high sodium and chloride, otherwise unremarkable.  EKG as reviewed by me: Sinus tachycardia at 100 with nonspecific ST-T wave changes.  Imaging: CT head "Gyriform hyperdensity in the right occipital lobe likely reflecting interval, now subacute infarction with laminar necrosis or petechial  hemorrhage. Postoperative changes in the posterior fossa.  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 changes.".   Assessment / Plan / Recommendation Clinical Impression  Pt appears to present w/ oropharyngeal phase dysphagia in light of declined Cognitive awareness and Oral phase dysphagia. This impacts her overall awareness w/ po trials and increases risk for aspiration, choking. Pt's risk for aspiration can be reduced when following general aspiration precautions, using a modified diet consistency, and given feeding assistance. She required Min+ tactile/verbal/ visual cues for orientation to bolus presentation, and during the Oral phase of bolus management. Pt consumed trials of purees and TSP/straw sips of Nectar consistency liquids w/ No immediate, overt clinical s/s of aspiration noted; no decline in vocal quality during few verbalizations, no cough, and no decline in respiratory status during/post trials. Oral phase was initially c/b oral holding and delayed A-P transfer of boluses but once engaged and swallowing "warmed-up", she was able to alternate b/t trial consistencies w/ adequate oral phase management and oral clearing of the boluses given. OM Exam was cursory, but No unilateral lingual/labial weakness noted. However, noted oral-lingual motor tremors at rest and during po trials. Twitches in forehead and UEs were noted as well Bilaterally. Pt responded minimally during tasks/questions and more often during more automatic verbal tasks vs confrontational questions; min delayed responses noted.   D/t pt's declined Cognitive awareness, and her risk for aspiration currently, recommend initiation of the dysphagia level 1(puree) w/ Nectar liquids; aspiration precautions; reduce Distractions during meals; Pills Crushed in Puree for safer swallowing. Support w/ feeding at meals -- check for oral clearing during/post intake. NSG updated.  Of note, per chart  notes from [place], family  described quite a bit of debility at Baseline prior to resections. Family stated that pt has been effectively wheelchair bound for nearly a Year, as chronic bilateral lower extremity weakness has limited her ability to ambulate, even with a walker. Pt lives with her daughter, on whom she is entirely dependent for completion of the majority of her ADLs. Dtr stated that pt has not experienced any changes since her resections, although her grand daughter states that she has New Right hand weakness and loss of coordination.  NSG/MD updated. ST will continue to monitor pt's status and trials to upgrade diet as appropriate.  Addendum:  This lab was later updated in chart - Na+ 162. Unsure if High Na could have been impacting her presentation during the evaluation.  SLP Visit Diagnosis: Dysphagia, oropharyngeal phase (R13.12);Cognitive communication deficit (R41.841)    Aspiration Risk  Mild aspiration risk;Risk for inadequate nutrition/hydration    Diet Recommendation  Dysphagia level 1 (puree) w/ Nectar liquids; aspiration precautions; feeding support and monitoring during all oral intake  Medication Administration: Crushed with puree (for safer swallowing)    Other  Recommendations Recommended Consults:  (Dietician f/u) Oral Care Recommendations: Oral care BID;Oral care before and after PO;Staff/trained caregiver to provide oral care Other Recommendations: Order thickener from pharmacy;Prohibited food (jello, ice cream, thin soups);Remove water pitcher;Have oral suction available   Follow up Recommendations  (TBD)      Frequency and Duration min 3x week  2 weeks       Prognosis Prognosis for Safe Diet Advancement: Fair Barriers to Reach Goals: Cognitive deficits;Language deficits;Severity of deficits;Time post onset      Swallow Study   General Date of Onset: 10/03/20 HPI: Pt is a 75 y.o. 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  Patient denied feeling weak in the arm face or leg, or slurred speech or drooling, headache.  Has had no fevers or chills.  She had not been recently ill except for recent hospitalization.  ED course: Patient awake and alert and oriented on arrival.  Temperature 99.2, BP 119/78, pulse 86 and respirations 16 with O2 sat 98% on room air.  Blood work significant for hemoglobin of 10.4 which is around her baseline.  BMP noted to have high sodium and chloride, otherwise unremarkable.  EKG as reviewed by me: Sinus tachycardia at 100 with nonspecific ST-T wave changes.  Imaging: CT head "Gyriform hyperdensity in the right occipital lobe likely reflecting interval, now subacute infarction with laminar necrosis or petechial hemorrhage. Postoperative changes in the posterior fossa.  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 changes.". Type of Study: Bedside Swallow Evaluation Previous Swallow Assessment: unknown Diet Prior to this Study: NPO (unknown prior to admit - dysphagia diet NOT indicated in notes) Temperature Spikes Noted: No (wbc 8.4) Respiratory Status: Room air History of Recent Intubation: No Behavior/Cognition: Alert;Cooperative;Pleasant mood;Distractible;Requires cueing (tremorous activity) Oral Cavity Assessment: Dry Oral Care Completed by SLP: Yes Oral Cavity - Dentition: Edentulous Vision:  (n/a) Self-Feeding Abilities: Total  assist Patient Positioning:  Upright in bed (needed positioning support) Baseline Vocal Quality: Normal;Low vocal intensity Volitional Cough: Cognitively unable to elicit Volitional Swallow: Unable to elicit    Oral/Motor/Sensory Function Overall Oral Motor/Sensory Function: Mild impairment (oral-lingual tremors) Facial Symmetry: Within Functional Limits Lingual ROM: Within Functional Limits Lingual Symmetry: Within Functional Limits   Ice Chips Ice chips: Not tested   Thin Liquid Thin Liquid: Not tested    Nectar Thick Nectar Thick Liquid: Impaired (min) Presentation: Spoon;Straw (4 trials Cheyenne each) Oral Phase Impairments: Reduced lingual movement/coordination Oral phase functional implications: Oral holding;Prolonged oral transit (intermittent) Pharyngeal Phase Impairments: Suspected delayed Swallow Other Comments: oral phase deficits can increase pharyngeal phase deficits and risk for aspiration   Honey Thick Honey Thick Liquid: Not tested   Puree Puree: Impaired Presentation: Spoon (fed; 10 trials) Oral Phase Impairments: Reduced lingual movement/coordination Oral Phase Functional Implications: Prolonged oral transit;Oral holding (intermittent) Pharyngeal Phase Impairments:  (no overt s/s)   Solid     Solid: Not tested        Orinda Kenner, MS, CCC-SLP Speech Language Pathologist Rehab Services [phone] Annelies Coyt 10/04/2020,4:46 PM

## 2020-10-04 NOTE — Evaluation (Addendum)
Occupational Therapy Evaluation Patient Details Name: Cheyenne Anthony MRN: [id number] DOB: 07/10/1946 Today's Date: 10/04/2020    History of Present Illness 75 y.o. 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 Pt was residing at [facility] and was sent to the emergency room after she was noted to have episodic shaking of the right arm and right face. 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 Per Neurology, EEG pending   Clinical Impression   Pt seen for OT evaluation this date. Upon arrival to room, pt awake and participating in session with Neurology. During OT evaluation, pt inconsistently answered eval questions with one-word answers, however appeared to put forth good effort to communicate. Additionally, pt inconsistently followed one-step commands, requiring multi-modal cues and HHA for sequencing. Right facial twitching observed throughout evaluation. Pt unable to answer PLOF questions d/t cognition and PLOF obtained from chart review/past encounters. Per chart review, pt was living with daughter [name]) prior to 1/22-2/22 admission and was MOD-I for ADLs and functional mobility with rollator walking prior to Dec 2021. Prior to this admission, pt was at a SNF and recieving assistance for ADLs at SNF; unsure level of assistance provided. Pt currently requires MIN-MAX A for bed-level ADLs. Pt would benefit from additional skilled OT services to maximize independence with ADLs/functional mobility and prevent further deconditioning. Upon discharge, recommend  SNF.  Addendum: Na+ 162 this AM, however labs were not listed in chart prior to OT session and at time of initial documentation. Will continue to monitor labs and treat when pt is medically appropriate.     Follow Up Recommendations  SNF    Equipment Recommendations  None recommended by OT       Precautions / Restrictions Precautions Precautions: Fall Restrictions Weight Bearing Restrictions: No      Mobility Bed Mobility               General bed mobility comments: Did not attempt d/t cognition and inconsistent command follow                         ADL either performed or assessed with clinical judgement   ADL Overall ADL's : Needs assistance/impaired     Grooming: Wash/dry face;Maximal assistance;Wash/dry hands;Minimal assistance;Cueing for sequencing;Bed level Grooming Details (indicate cue type and reason): Pt able to bring washcloth to face. Required HHA to wash ~75% of face. MIN A for thoroughness during hand washing                                     Vision   Vision Assessment?: Vision impaired- to be further tested in functional context Additional Comments: R gaze preference            Pertinent Vitals/Pain Pain Assessment: Faces Faces Pain Scale: Hurts even more Pain Location: Legs follow light touch Pain Descriptors / Indicators: Aching Pain Intervention(s): Limited activity within patient's tolerance;Monitored during session     Hand Dominance Right   Extremity/Trunk Assessment Upper Extremity Assessment Upper Extremity Assessment: RUE deficits/detail;LUE deficits/detail RUE Deficits / Details: Shoulder flexion AROM <90  degrees. Elbow grossly 3/5 and grip 2/5 LUE Deficits / Details: Shoulder flexion AROM <90 degrees. Elbow grossly 3/5 and grip 2/5 LUE: Unable to fully assess due to pain (pt endorsing pain with light touch)           Communication Communication Communication: Expressive difficulties    Cognition Arousal/Alertness: Awake/alert Behavior During Therapy: Flat affect Overall Cognitive Status: Impaired/Different from baseline                                 General Comments: Pt inconsistently answering eval questions with one-word answers, however appearing to put forth good effort to communicate. Right facial twitching throughout evaluation. Pt inconsistently following one-step commands, requiring multi-modal cues and HHA for sequencing   General Comments       Exercises     Shoulder Instructions      Home Living Family/patient expects to be discharged to:: Skilled nursing facility                                 Additional Comments: Pt unable to answer PLOF questions. Per chart review, pt was living with daughter [name]) prior to 1/22-2/22 admission. Prior to this admission, pt was at a SNF.      Prior Functioning/Environment Level of Independence: Needs assistance  Gait / Transfers Assistance Needed: Per chart review, pt was MOD-I for ADLs and functional mobility with rollator walking prior to Dec 2021. Since 1/22-2/22 admission, pt was recieving assistance for ADLs at SNF; unsure level of assistance required              OT Problem List: Decreased strength;Decreased activity tolerance;Impaired balance (sitting and/or standing);Decreased cognition;Pain      OT Treatment/Interventions: Self-care/ADL training;Therapeutic exercise;Neuromuscular education;Energy conservation;DME and/or AE instruction;Therapeutic activities;Patient/family education;Balance training    OT Goals(Current goals can be found in the care plan section) Acute Rehab OT Goals Patient Stated Goal: none stated OT Goal Formulation: With patient Time For Goal Achievement: 10/18/20 Potential to Achieve Goals: Good ADL Goals Pt Will Perform Grooming: with min assist;sitting Pt Will Perform Upper Body Dressing: with min assist;sitting Pt Will Perform Lower Body  Dressing: with mod assist;sitting/lateral leans  OT Frequency: Min 1X/week    AM-PAC OT "6 Clicks" Daily Activity     Outcome Measure Help from another person eating meals?: A Little Help from another person taking care of personal grooming?: A Lot Help from another person toileting, which includes using toliet, bedpan, or urinal?: Total Help from another person bathing (including washing, rinsing, drying)?: A Lot Help from another person to put on and taking off regular upper body clothing?: A Lot Help from another person to put on and taking off regular lower body clothing?: Total 6 Click Score: 11   End of Session Nurse Communication: Mobility status  Activity Tolerance: Patient tolerated treatment well Patient left: in bed;with call bell/phone within reach;with bed alarm set  OT Visit Diagnosis: Muscle weakness (generalized) (M62.81);Other symptoms and signs involving cognitive function                Time: 4196-2229 OT Time Calculation (min): 24 min Charges:  OT General Charges $OT Visit: 1 Visit OT Evaluation $OT Eval Moderate Complexity: [place] Kittrell, OTR/L [place]

## 2020-10-04 NOTE — Progress Notes (Signed)
eeg done °

## 2020-10-04 NOTE — Progress Notes (Signed)
PROGRESS NOTE    Cheyenne Anthony  XHB:[id number] DOB: 08-21-1945 DOA: 10/03/2020 PCP: [place]   Brief Narrative: Taken from H&P. Cheyenne Anthony is a 75 y.o. 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  MRI with a small left precentral gyrus acute infarct. Concern of seizures with acute infarct and intracranial metastatic disease s/p craniotomy. Neurology was consulted, she was loaded with Keppra followed by start of Keppra twice daily.  EEG done-pending results.  Subjective: Patient appears very lethargic and continues to have twitching more prominent on right side of the face and right shoulder.  Appears confused but able to tell me her name.  Did not answer any other questions.  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  Imaging positive for acute precentral gyrus CVA.  That can it self trigger seizures, right sided twitching is concerning for focal seizure-like activity.  Neurology was consulted-they are loaded her with Keppra and she was started on twice  daily dose of Keppra. History of recent craniotomy secondary to cerebellar mets at [facility] in January 2022. CVA work-up with A1c of 7.4, lipid panel with LDL of 22 and HDL of 40, echocardiogram pending. -Complete CVA work-up. -Continue with Keppra -Continue to monitor for any new neurologic deficit -Patient will go back to SNF -Follow-up EEG results  Hypernatremia.  Worsening sodium at 162 today.  Appears dry -Increase the rate of D5 to 150 mL/h. -Monitor sodium  Polyuria with concern of DI.  Patient is having quite diluted polyuria.  High risk for DI secondary to metastatic disease and recent brain surgery. -will need DDAVP -Consult nephrology.  Hypertension.  Blood pressure within goal. -Holding home antihypertensives which include metoprolol, Zestoretic and nifedipine for permissive hypertension at this time.  Type 2 diabetes mellitus with renal manifestations.  Uncontrolled with A1c of 7.4 -Continue with SSI  History of adrenal crisis in 08/2020.  Repeat a.m. cortisol today was within normal limit. Patient completed the tapering dose of prednisone.  History of lung cancer, metastatic to cerebellum status post craniotomy -No acute issues -She will continue outpatient follow-up with oncology. -Patient is very high risk for deterioration and death. -Will involve palliative care   Objective: Vitals:   10/04/20 0917 10/04/20 1054 10/04/20 1528 10/04/20 1540  BP: 139/86 107/70 108/67 111/86  Pulse: (!) 107 (!) 101 (!) 106 (!) 105  Resp: 20 19 18    Temp: 98.8 F (37.1 C) 97.8 F (36.6 C)  98 F (36.7 C)  TempSrc: Oral Oral  Oral  SpO2: 92% 97% 95% 96%  Weight:      Height:        Intake/Output Summary (Last 24  hours) at 10/04/2020 1546 Last data filed at 10/04/2020 0600 Gross per 24 hour  Intake 150 ml  Output 1300 ml  Net -1150 ml   Filed Weights   10/03/20 1808 10/04/20 0444  Weight: 59 kg 84.8 kg    Examination:  General exam: Lethargic appearing elderly lady,  appears calm and comfortable  Respiratory system: Clear to auscultation. Respiratory effort normal. Cardiovascular system: S1 & S2 heard, RRR. No JVD, murmurs, rubs, gallops or clicks. Gastrointestinal system: Soft, nontender, nondistended, bowel sounds positive. Central nervous system: Alert and oriented. No focal neurological deficits.twitching involving right side of face and shoulder. Extremities: No edema, no cyanosis, pulses intact and symmetrical. Psychiatry: Judgement and insight appear impaired.  DVT prophylaxis: Lovenox Code Status: Full Family Communication: Called daughter with no response. Disposition Plan:  Status is: Inpatient  Remains inpatient appropriate because:Inpatient level of care appropriate due to severity of illness   Dispo: The patient is from: SNF              Anticipated d/c is to: SNF              Patient currently is not medically stable to d/c.   Difficult to place patient No              Level of care: Progressive Cardiac  All the records are reviewed and case discussed with Care Management/Social Worker. Management plans discussed with the patient, nursing and they are in agreement.  Consultants:   Neurology  Nephrology  Palliative care  Procedures:  Antimicrobials:   Data Reviewed: I have personally reviewed following labs and imaging studies  CBC: Recent Labs  Lab 10/03/20 1818  WBC 8.4  NEUTROABS 4.5  HGB 10.4*  HCT 35.7*  MCV 89.9  PLT 638*   Basic Metabolic Panel: Recent Labs  Lab 10/03/20 1818 10/03/20 2121 10/04/20 0627  NA 151* 153* 162*  K 3.7 3.6 3.6  CL 119* 122* 129*  CO2 23 25 26   GLUCOSE 137* 121* 138*  BUN 12 12 12   CREATININE 0.84 0.81 0.73  CALCIUM 9.3 9.0 9.5   GFR: Estimated Creatinine Clearance: 65.9 mL/min (by C-G formula based on SCr of 0.73 mg/dL). Liver Function Tests: No results for input(s): AST, ALT, ALKPHOS, BILITOT, PROT, ALBUMIN in the last 168 hours. No results for input(s): LIPASE,  AMYLASE in the last 168 hours. No results for input(s): AMMONIA in the last 168 hours. Coagulation Profile: No results for input(s): INR, PROTIME in the last 168 hours. Cardiac Enzymes: No results for input(s): CKTOTAL, CKMB, CKMBINDEX, TROPONINI in the last 168 hours. BNP (last 3 results) No results for input(s): PROBNP in the last 8760 hours. HbA1C: Recent Labs    10/04/20 0627  HGBA1C 7.4*   CBG: No results for input(s): GLUCAP in the last 168 hours. Lipid Profile: Recent Labs    10/04/20 0627  CHOL 89  HDL 40*  LDLCALC 22  TRIG 134  CHOLHDL 2.2   Thyroid Function Tests: No results for input(s): TSH, T4TOTAL, FREET4, T3FREE, THYROIDAB in the last 72 hours. Anemia Panel: No results for input(s): VITAMINB12, FOLATE, FERRITIN, TIBC, IRON, RETICCTPCT in the last 72 hours. Sepsis Labs: No results for input(s): PROCALCITON, LATICACIDVEN in the last 168 hours.  Recent Results (from the past 240 hour(s))  SARS CORONAVIRUS 2 (TAT 6-24 HRS) Nasopharyngeal Nasopharyngeal Swab     Status: None   Collection Time: 10/03/20  7:56 PM   Specimen: Nasopharyngeal Swab  Result Value Ref Range Status  SARS Coronavirus 2 NEGATIVE NEGATIVE Final    Comment: (NOTE) SARS-CoV-2 target nucleic acids are NOT DETECTED.  The SARS-CoV-2 RNA is generally detectable in upper and lower respiratory specimens during the acute phase of infection. Negative results do not preclude SARS-CoV-2 infection, do not rule out co-infections with other pathogens, and should not be used as the sole basis for treatment or other patient management decisions. Negative results must be combined with clinical observations, patient history, and epidemiological information. The expected result is Negative.  Fact Sheet for Patients: SugarRoll.be  Fact Sheet for Healthcare Providers: https://www.woods-mathews.com/  This test is not yet approved or cleared by the [place]  FDA and  has been authorized for detection and/or diagnosis of SARS-CoV-2 by FDA under an Emergency Use Authorization (EUA). This EUA will remain  in effect (meaning this test can be used) for the duration of the COVID-19 declaration under Se ction 564(b)(1) of the Act, 21 U.S.C. section 360bbb-3(b)(1), unless the authorization is terminated or revoked sooner.  Performed at [facility], [place] [address], [place], [place] [address]      Radiology Studies: CT [facility] HEAD NECK W WO CONTRAST  Result Date: 10/03/2020 CLINICAL DATA:  Stroke follow-up. Right arm shaking. EXAM: CT ANGIOGRAPHY HEAD AND NECK TECHNIQUE: Multidetector CT imaging of the head and neck was performed using the standard protocol during bolus administration of intravenous contrast. Multiplanar CT image reconstructions and MIPs were obtained to evaluate the vascular anatomy. Carotid stenosis measurements (when applicable) are obtained utilizing NASCET criteria, using the distal internal carotid diameter as the denominator. CONTRAST:  19mL OMNIPAQUE IOHEXOL 350 MG/ML SOLN COMPARISON:  None. FINDINGS: CTA NECK FINDINGS SKELETON: There is no bony spinal canal stenosis. No lytic or blastic lesion. OTHER NECK: Normal pharynx, larynx and major salivary glands. No cervical lymphadenopathy. Unremarkable thyroid gland. UPPER CHEST: No pneumothorax or pleural effusion. No nodules or masses. AORTIC ARCH: There is calcific atherosclerosis of the aortic arch. There is no aneurysm, dissection or hemodynamically significant stenosis of the visualized portion of the aorta. Conventional 3 vessel aortic branching pattern. The visualized proximal subclavian arteries are widely patent. RIGHT CAROTID SYSTEM: No dissection, occlusion or aneurysm. There is predominantly calcified atherosclerosis extending into the proximal ICA, resulting in approximately 70% stenosis. LEFT CAROTID SYSTEM: No dissection, occlusion or aneurysm. There is calcific  atherosclerosis extending into the proximal ICA, resulting in less than 50% stenosis. VERTEBRAL ARTERIES: Right dominant configuration. Both origins are clearly patent. There is no dissection, occlusion or flow-limiting stenosis to the skull base (V1-V3 segments). CTA HEAD FINDINGS POSTERIOR CIRCULATION: --Vertebral arteries: Normal V4 segments. --Inferior cerebellar arteries: Normal. --Basilar artery: Normal. --Superior cerebellar arteries: Normal. --Posterior cerebral arteries (PCA): Normal. ANTERIOR CIRCULATION: --Intracranial internal carotid arteries: Atherosclerotic calcification of the internal carotid arteries at the skull base with mild-to-moderate stenosis worse at the distal right cavernous segment. --Anterior cerebral arteries (ACA): Normal. Both A1 segments are present. Patent anterior communicating artery (a-comm). --Middle cerebral arteries (MCA): Normal. VENOUS SINUSES: As permitted by contrast timing, patent. ANATOMIC VARIANTS: None Review of the MIP images confirms the above findings. IMPRESSION: 1. No intracranial arterial occlusion or high-grade stenosis. 2. 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 Aortic Atherosclerosis (ICD10-I70.0). Electronically Signed   By: Ulyses Jarred M.D.   On: 10/03/2020 23:49   CT Head Wo Contrast  Result Date: 10/03/2020 CLINICAL DATA:  Shaking, history of brain tumor EXAM: CT HEAD WITHOUT CONTRAST TECHNIQUE: Contiguous axial images were  obtained from the base of the skull through the vertex without intravenous contrast. COMPARISON:  08/19/2020 FINDINGS: Brain: Chronic right MCA territory infarction involving the frontal lobe and insula. There is gyriform hyperdensity in the right occipital lobe. Hypoattenuation likely reflecting surgical cavity is present in the right cerebellum underlying craniectomy and cranioplasty without mass effect. Limited evaluation for tumor on this  study. Prominence of the ventricles and sulci reflects generalized parenchymal volume loss. Additional patchy hypoattenuation in the supratentorial white matter is nonspecific but probably reflects stable chronic microvascular ischemic changes. Vascular: No hyperdense vessel.There is atherosclerotic calcification at the skull base. Skull: Right lateral suboccipital craniectomy with cranioplasty. Small right frontal burr hole. Sinuses/Orbits: No acute finding. Other: None. IMPRESSION: Gyriform hyperdensity in the right occipital lobe likely reflecting interval, now subacute infarction with laminar necrosis or petechial hemorrhage. Postoperative changes in the posterior fossa. 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 changes. Electronically Signed   By: Macy Mis M.D.   On: 10/03/2020 19:03   MR BRAIN WO CONTRAST  Result Date: 10/03/2020 CLINICAL DATA:  Tremor EXAM: MRI HEAD WITHOUT CONTRAST TECHNIQUE: Multiplanar, multiecho pulse sequences of the brain and surrounding structures were obtained without intravenous contrast. COMPARISON:  08/20/2020 FINDINGS: Brain: There is a small acute infarct within the left frontal lobe along the left precentral gyrus. There are old infarcts of the right frontal operculum and right occipital lobe. There are postsurgical changes of right cerebellum. Chronic blood products at the right cerebellum There is multifocal hyperintense T2-weighted signal within the white matter. Generalized volume loss without a clear lobar predilection. The midline structures are normal. Vascular: Major flow voids are preserved. Skull and upper cervical spine: Right retromastoid cranioplasty. Sinuses/Orbits:No paranasal sinus fluid levels or advanced mucosal thickening. No mastoid or middle ear effusion. Normal orbits. IMPRESSION: 1. Small acute infarct of the left precentral gyrus. No hemorrhage or mass effect. 2. Old infarcts of the right frontal operculum and right  occipital lobe. Electronically Signed   By: Ulyses Jarred M.D.   On: 10/03/2020 21:43   US Venous Img Lower Bilateral (DVT)  Result Date: 10/04/2020 CLINICAL DATA:  History of DVT EXAM: BILATERAL LOWER EXTREMITY VENOUS DOPPLER ULTRASOUND TECHNIQUE: Gray-scale sonography with graded compression, as well as color Doppler and duplex ultrasound were performed to evaluate the lower extremity deep venous systems from the level of the common femoral vein and including the common femoral, femoral, profunda femoral, popliteal and calf veins including the posterior tibial, peroneal and gastrocnemius veins when visible. The superficial great saphenous vein was also interrogated. Spectral Doppler was utilized to evaluate flow at rest and with distal augmentation maneuvers in the common femoral, femoral and popliteal veins. COMPARISON:  None. FINDINGS: RIGHT LOWER EXTREMITY Common Femoral Vein: No evidence of thrombus. Normal compressibility, respiratory phasicity and response to augmentation. Saphenofemoral Junction: No evidence of thrombus. Normal compressibility and flow on color Doppler imaging. Profunda Femoral Vein: No evidence of thrombus. Normal compressibility and flow on color Doppler imaging. Femoral Vein: No evidence of thrombus. Normal compressibility, respiratory phasicity and response to augmentation. Popliteal Vein: No evidence of thrombus. Normal compressibility, respiratory phasicity and response to augmentation. Calf Veins: No evidence of thrombus. Normal compressibility and flow on color Doppler imaging. Superficial Great Saphenous Vein: No evidence of thrombus. Normal compressibility. Venous Reflux:  None. Other Findings:  None. LEFT LOWER EXTREMITY Common Femoral Vein: No evidence of thrombus. Normal compressibility, respiratory phasicity and response to augmentation. Saphenofemoral Junction: No evidence of thrombus. Normal compressibility and flow on color  Doppler imaging. Profunda Femoral Vein: No  evidence of thrombus. Normal compressibility and flow on color Doppler imaging. Femoral Vein: No evidence of thrombus. Normal compressibility, respiratory phasicity and response to augmentation. Popliteal Vein: No evidence of thrombus. Normal compressibility, respiratory phasicity and response to augmentation. Calf Veins: No evidence of thrombus. Normal compressibility and flow on color Doppler imaging. Superficial Great Saphenous Vein: No evidence of thrombus. Normal compressibility. Venous Reflux:  None. Other Findings:  None. IMPRESSION: No evidence of deep venous thrombosis in either lower extremity. Electronically Signed   By: Inez Catalina M.D.   On: 10/04/2020 00:29    Scheduled Meds: .  stroke: mapping our early stages of recovery book   Does not apply Once  . aspirin  300 mg Rectal Daily   Or  . aspirin EC  325 mg Oral Daily  . enoxaparin (LOVENOX) injection  40 mg Subcutaneous Q24H   Continuous Infusions: . dextrose 100 mL/hr at 10/04/20 1019  . levETIRAcetam     Followed by  . [START ON 10/05/2020] levETIRAcetam       LOS: 0 days   Time spent: 40 minutes. More than 50% of the time was spent in counseling/coordination of care  Lorella Nimrod, MD [facility]  If 7PM-7AM, please contact night-coverage [email or web address]  10/04/2020, 3:46 PM   This record has been created using Systems analyst. Errors have been sought and corrected,but may not always be located. Such creation errors do not reflect on the standard of care.

## 2020-10-04 NOTE — Progress Notes (Signed)
PT Cancellation Note  Patient Details Name: Cheyenne Anthony MRN: [id number] DOB: March 24, 1946   Cancelled Treatment:    Reason Eval/Treat Not Completed: Medical issues which prohibited therapy (Per chart review, Na+ 162. Noted delay in this portion of lab uploads, unclear why it posted later than other labs this AM. WIll evaluate once appropriate at later date/time.)   [name] C 10/04/2020, 1:36 PM

## 2020-10-04 NOTE — Progress Notes (Signed)
*  PRELIMINARY RESULTS* Echocardiogram 2D Echocardiogram has been performed.  Sherrie Sport 10/04/2020, 4:22 PM

## 2020-10-04 NOTE — Procedures (Signed)
Patient Name: Cheyenne Anthony  MRN: [id number]  Epilepsy Attending: Lora Havens  Referring Physician/Provider: Dr Rosalin Hawking Date: 10/04/2020  Duration: 38.25 mins  Patient history: 75 y.o. 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 . EEG to evaluate for seizure  Level of alertness:  lethargic  AEDs during EEG study: LEV  Technical aspects: This EEG study was done with scalp electrodes positioned according to the 10-20 International system of electrode placement. Electrical activity was acquired at a sampling rate of 500Hz  and reviewed with a high frequency filter of 70Hz  and a low frequency filter of 1Hz . EEG data were recorded continuously and digitally stored.   Description: No posterior dominant rhythm was seen. EEG showed continuous generalized 3 to 6 Hz theta-delta slowing.  Hyperventilation and photic stimulation were not performed.     Patient was also noted to have right>left head and neck twithcing per eeg technician ( not able to visualize on camera) during eeg. Concomitant eeg  before, during and after the event didn't show any eeg change to suggest seizure  ABNORMALITY - Continuous slow, generalized  IMPRESSION: This study is suggestive of moderate diffuse encephalopathy, nonspecific etiology. Patient was also noted to have right>left head and neck twitching without concomitant eeg change and therefore most likely not epileptic. However, focal motor seizure may not seen on scalp eeg . Therefore, clinical correlation is recommended.   Dr Erlinda Hong ws notified.  Cheyenne Anthony

## 2020-10-04 NOTE — ED Notes (Signed)
This RN attempted to update daughter. Daughter did not answer the phone.

## 2020-10-05 ENCOUNTER — Encounter: Payer: Self-pay | Admitting: Internal Medicine

## 2020-10-05 ENCOUNTER — Inpatient Hospital Stay: Payer: Medicare (Managed Care)

## 2020-10-05 DIAGNOSIS — I639 Cerebral infarction, unspecified: Secondary | ICD-10-CM | POA: Diagnosis not present

## 2020-10-05 DIAGNOSIS — C7931 Secondary malignant neoplasm of brain: Secondary | ICD-10-CM | POA: Diagnosis not present

## 2020-10-05 DIAGNOSIS — E870 Hyperosmolality and hypernatremia: Secondary | ICD-10-CM | POA: Diagnosis not present

## 2020-10-05 DIAGNOSIS — C3490 Malignant neoplasm of unspecified part of unspecified bronchus or lung: Secondary | ICD-10-CM | POA: Diagnosis not present

## 2020-10-05 DIAGNOSIS — Z7189 Other specified counseling: Secondary | ICD-10-CM

## 2020-10-05 DIAGNOSIS — E271 Primary adrenocortical insufficiency: Secondary | ICD-10-CM

## 2020-10-05 HISTORY — DX: Primary adrenocortical insufficiency: E27.1

## 2020-10-05 LAB — BASIC METABOLIC PANEL
Anion gap: 7 (ref 5–15)
Anion gap: 7 (ref 5–15)
Anion gap: 6 (ref 5–15)
BUN: 11 mg/dL (ref 8–23)
BUN: 12 mg/dL (ref 8–23)
BUN: 14 mg/dL (ref 8–23)
CO2: 27 mmol/L (ref 22–32)
CO2: 26 mmol/L (ref 22–32)
CO2: 26 mmol/L (ref 22–32)
Calcium: 9.5 mg/dL (ref 8.9–10.3)
Calcium: 9.4 mg/dL (ref 8.9–10.3)
Calcium: 9.2 mg/dL (ref 8.9–10.3)
Chloride: 129 mmol/L — ABNORMAL HIGH (ref 98–111)
Chloride: 128 mmol/L — ABNORMAL HIGH (ref 98–111)
Chloride: 129 mmol/L — ABNORMAL HIGH (ref 98–111)
Creatinine, Ser: 0.87 mg/dL (ref 0.44–1.00)
Creatinine, Ser: 0.89 mg/dL (ref 0.44–1.00)
Creatinine, Ser: 0.95 mg/dL (ref 0.44–1.00)
GFR, Estimated: >60 mL/min (ref >60)
GFR, Estimated: >60 mL/min (ref >60)
GFR, Estimated: >60 mL/min (ref >60)
Glucose, Bld: 187 mg/dL — ABNORMAL HIGH (ref 70–99)
Glucose, Bld: 255 mg/dL — ABNORMAL HIGH (ref 70–99)
Glucose, Bld: 136 mg/dL — ABNORMAL HIGH (ref 70–99)
Potassium: 3 mmol/L — ABNORMAL LOW (ref 3.5–5.1)
Potassium: 3.2 mmol/L — ABNORMAL LOW (ref 3.5–5.1)
Potassium: 3.2 mmol/L — ABNORMAL LOW (ref 3.5–5.1)
Sodium: 163 mmol/L (ref 135–145)
Sodium: 161 mmol/L (ref 135–145)
Sodium: 161 mmol/L (ref 135–145)

## 2020-10-05 LAB — HEPARIN LEVEL (UNFRACTIONATED): Heparin Unfractionated: 0.68 IU/mL (ref 0.30–0.70)

## 2020-10-05 LAB — GLUCOSE, CAPILLARY
Glucose-Capillary: 230 mg/dL — ABNORMAL HIGH (ref 70–99)
Glucose-Capillary: 133 mg/dL — ABNORMAL HIGH (ref 70–99)
Glucose-Capillary: 188 mg/dL — ABNORMAL HIGH (ref 70–99)

## 2020-10-05 LAB — CBC
HCT: 35.8 % — ABNORMAL LOW (ref 36.0–46.0)
Hemoglobin: 10.3 g/dL — ABNORMAL LOW (ref 12.0–15.0)
MCH: 26.1 pg (ref 26.0–34.0)
MCHC: 28.8 g/dL — ABNORMAL LOW (ref 30.0–36.0)
MCV: 90.9 fL (ref 80.0–100.0)
Platelets: 507 10*3/uL — ABNORMAL HIGH (ref 150–400)
RBC: 3.94 MIL/uL (ref 3.87–5.11)
RDW: 18 % — ABNORMAL HIGH (ref 11.5–15.5)
WBC: 9.3 10*3/uL (ref 4.0–10.5)
nRBC: 0 % (ref 0.0–0.2)

## 2020-10-05 LAB — APTT
aPTT: 41 seconds — ABNORMAL HIGH (ref 24–36)
aPTT: 66 seconds — ABNORMAL HIGH (ref 24–36)

## 2020-10-05 LAB — NA AND K (SODIUM & POTASSIUM), RAND UR
Potassium Urine: 9 mmol/L
Sodium, Ur: 39 mmol/L

## 2020-10-05 LAB — OSMOLALITY, URINE: Osmolality, Ur: 113 mOsm/kg — ABNORMAL LOW (ref 300–900)

## 2020-10-05 LAB — PROTIME-INR
INR: 1.2 (ref 0.8–1.2)
Prothrombin Time: 14.7 seconds (ref 11.4–15.2)

## 2020-10-05 LAB — OSMOLALITY: Osmolality: 342 mOsm/kg (ref 275–295)

## 2020-10-05 MED ORDER — HEPARIN (PORCINE) 25000 UT/250ML-% IV SOLN
950.0000 [IU]/h | INTRAVENOUS | Status: DC
  Administered 2020-10-05: 900 [IU]/h via INTRAVENOUS
  Filled 2020-10-05: qty 250

## 2020-10-05 MED ORDER — INSULIN ASPART 100 UNIT/ML ~~LOC~~ SOLN
0.0000 [IU] | Freq: Every day | SUBCUTANEOUS | Status: DC
  Administered 2020-10-10 – 2020-10-12 (×3): 2 [IU] via SUBCUTANEOUS
  Filled 2020-10-05 (×4): qty 1

## 2020-10-05 MED ORDER — POTASSIUM CHLORIDE CRYS ER 20 MEQ PO TBCR
40.0000 meq | EXTENDED_RELEASE_TABLET | Freq: Two times a day (BID) | ORAL | Status: AC
  Administered 2020-10-05 (×2): 40 meq via ORAL
  Filled 2020-10-05 (×3): qty 2

## 2020-10-05 MED ORDER — DESMOPRESSIN ACE SPRAY REFRIG 0.01 % NA SOLN
1.0000 | Freq: Two times a day (BID) | NASAL | Status: DC
  Administered 2020-10-05 – 2020-10-06 (×3): 1 via NASAL
  Filled 2020-10-05: qty 5

## 2020-10-05 MED ORDER — DICLOFENAC SODIUM 1 % EX GEL
4.0000 g | Freq: Four times a day (QID) | CUTANEOUS | Status: DC
  Administered 2020-10-05 – 2020-10-09 (×15): 4 g via TOPICAL
  Filled 2020-10-05 (×2): qty 100

## 2020-10-05 MED ORDER — ATORVASTATIN CALCIUM 20 MG PO TABS
20.0000 mg | ORAL_TABLET | Freq: Every day | ORAL | Status: DC
Start: 2020-10-05 — End: 2020-10-16
  Administered 2020-10-05 – 2020-10-16 (×12): 20 mg via ORAL
  Filled 2020-10-05 (×12): qty 1

## 2020-10-05 MED ORDER — INSULIN ASPART 100 UNIT/ML ~~LOC~~ SOLN
0.0000 [IU] | Freq: Three times a day (TID) | SUBCUTANEOUS | Status: DC
  Administered 2020-10-05: 3 [IU] via SUBCUTANEOUS
  Administered 2020-10-06: 5 [IU] via SUBCUTANEOUS
  Administered 2020-10-06: 2 [IU] via SUBCUTANEOUS
  Administered 2020-10-07: 3 [IU] via SUBCUTANEOUS
  Administered 2020-10-07: 1 [IU] via SUBCUTANEOUS
  Administered 2020-10-07 – 2020-10-09 (×2): 3 [IU] via SUBCUTANEOUS
  Administered 2020-10-10: 2 [IU] via SUBCUTANEOUS
  Administered 2020-10-10: 09:00:00 1 [IU] via SUBCUTANEOUS
  Administered 2020-10-10: 17:00:00 3 [IU] via SUBCUTANEOUS
  Administered 2020-10-11: 12:00:00 2 [IU] via SUBCUTANEOUS
  Administered 2020-10-11 (×2): 1 [IU] via SUBCUTANEOUS
  Administered 2020-10-12: 18:00:00 2 [IU] via SUBCUTANEOUS
  Administered 2020-10-12: 5 [IU] via SUBCUTANEOUS
  Administered 2020-10-12: 09:00:00 2 [IU] via SUBCUTANEOUS
  Administered 2020-10-13: 3 [IU] via SUBCUTANEOUS
  Administered 2020-10-13 – 2020-10-14 (×4): 2 [IU] via SUBCUTANEOUS
  Administered 2020-10-15: 13:00:00 3 [IU] via SUBCUTANEOUS
  Administered 2020-10-16: 1 [IU] via SUBCUTANEOUS
  Filled 2020-10-05 (×23): qty 1

## 2020-10-05 MED ORDER — ASPIRIN EC 81 MG PO TBEC
81.0000 mg | DELAYED_RELEASE_TABLET | Freq: Every day | ORAL | Status: DC
  Administered 2020-10-05 – 2020-10-16 (×12): 81 mg via ORAL
  Filled 2020-10-05 (×11): qty 1

## 2020-10-05 NOTE — Consult Note (Signed)
[facility]  CONSULT NOTE    Date: 10/05/2020                  Patient Name:  Cheyenne Anthony  MRN: [id number]  DOB: 1945/11/01  Age / Sex: 75 y.o., female         PCP: [facility]                 Service Requesting Consult: Dr. Reesa Chew                 Reason for Consult: Hypernatremia            History of Present Illness: Ms. Cheyenne Anthony presents to [facility] with right side tremor and facial twitching. Patient was found to have acute CVA with started on aspirin. Started on keppra. Daughter at bedside. Patient alert to self only this morning. Patient's sodium increased to 162. DDAVP and dextrose infusion. No improvement. Nephrology consulted.   Medications: Outpatient medications: Medications Prior to Admission  Medication Sig Dispense Refill Last Dose  . apixaban (ELIQUIS) 5 MG TABS tablet Take 5 mg by mouth every 12 (twelve) hours.   Unknown at Unknown  . atorvastatin (LIPITOR) 40 MG tablet Take 40 mg by mouth daily.   Unknown at Unknown  . DULoxetine (CYMBALTA) 60 MG capsule Take 60 mg by mouth daily.   Unknown at Unknown  . latanoprost (XALATAN) 0.005 % ophthalmic solution Place 1 drop into both eyes at bedtime.   Unknown at Unknown  . potassium chloride SA (KLOR-CON) 20 MEQ tablet Take 20 mEq by mouth daily.   Unknown at Unknown  . senna-docusate (SENOKOT-S) 8.6-50 MG tablet Take 2 tablets by mouth in the morning and at bedtime.   Unknown at Unknown  . sitaGLIPtin-metformin (JANUMET) 50-500 MG tablet Take 1 tablet by mouth in the morning and at bedtime.   Unknown at Unknown  . traMADol (ULTRAM) 50 MG tablet Take 50 mg by mouth 3 (three) times daily between meals as needed for pain.   Unknown at Unknown  . ACETAMINOPHEN EXTRA STRENGTH 500 MG tablet Take 1,000 mg by mouth every 8 (eight) hours. (Patient not taking: Reported on 10/04/2020)   Not Taking at Unknown  . hydrocortisone (CORTEF) 10 MG tablet Take 1 tablet (10 mg total) by mouth daily. With  Breakfast (Patient not taking: Reported on 10/04/2020)   Not Taking at Unknown time  . hydrocortisone (CORTEF) 5 MG tablet Take 1 tablet (5 mg total) by mouth daily. At 2 PM (Patient not taking: Reported on 10/04/2020)   Not Taking at Unknown time  . lisinopril-hydrochlorothiazide (ZESTORETIC) 20-25 MG tablet Take 1 tablet by mouth daily. (Patient not taking: Reported on 10/04/2020)   Not Taking at Unknown time  . metoprolol succinate (TOPROL-XL) 50 MG 24 hr tablet Take 50 mg by mouth daily. Take with or immediately following a meal. (Patient not taking: Reported on 10/04/2020)   Not Taking at Unknown time  . MIRALAX 17 g packet Take 17 g by mouth daily. (Patient not taking: Reported on 10/04/2020)   Not Taking at Unknown time  . NIFEdipine (ADALAT CC) 90 MG 24 hr tablet Take 90 mg by mouth daily. (Patient not taking: Reported on 10/04/2020)   Not Taking at Unknown time    Current medications: Current Facility-Administered Medications  Medication Dose Route Frequency Provider Last Rate Last Admin  .  stroke: mapping our early stages of recovery book   Does not apply Once Judd Gaudier  V, MD      . acetaminophen (TYLENOL) tablet 650 mg  650 mg Oral Q4H PRN Athena Masse, MD   650 mg at 10/05/20 0844   Or  . acetaminophen (TYLENOL) 160 MG/5ML solution 650 mg  650 mg Per Tube Q4H PRN Athena Masse, MD       Or  . acetaminophen (TYLENOL) suppository 650 mg  650 mg Rectal Q4H PRN Athena Masse, MD      . aspirin EC tablet 81 mg  81 mg Oral Daily Rosalin Hawking, MD   81 mg at 10/05/20 0844  . atorvastatin (LIPITOR) tablet 20 mg  20 mg Oral Daily Rosalin Hawking, MD   20 mg at 10/05/20 0844  . desmopressin (DDAVP) 0.01 % (10 mcg/activation) spray 1 spray  1 spray Nasal BID Lorella Nimrod, MD   1 spray at 10/05/20 0844  . dextrose 5 % solution   Intravenous Continuous Lorella Nimrod, MD 150 mL/hr at 10/04/20 1759 New Bag at 10/04/20 1759  . diclofenac Sodium (VOLTAREN) 1 % topical gel 4 g  4 g Topical QID Lorella Nimrod, MD      . heparin ADULT infusion 100 units/mL (25000 units/221mL)  900 Units/hr Intravenous Continuous Oswald Hillock, RPH      . insulin aspart (novoLOG) injection 0-5 Units  0-5 Units Subcutaneous QHS Amin, Soundra Pilon, MD      . insulin aspart (novoLOG) injection 0-9 Units  0-9 Units Subcutaneous TID WC Amin, Soundra Pilon, MD      . levETIRAcetam (KEPPRA) IVPB 500 mg/100 mL premix  500 mg Intravenous Q12H Rosalin Hawking, MD 400 mL/hr at 10/04/20 2357 500 mg at 10/04/20 2357  . LORazepam (ATIVAN) injection 1-2 mg  1-2 mg Intravenous Q2H PRN Judd Gaudier V, MD      . potassium chloride SA (KLOR-CON) CR tablet 40 mEq  40 mEq Oral BID Lorella Nimrod, MD   40 mEq at 10/05/20 0844      Allergies: No Known Allergies    Past Medical History: Past Medical History:  Diagnosis Date  . Brain mass 07/31/2020  . Cancer ([place])   . Hypertension   . Right lower lobe lung mass   . Type II diabetes mellitus with renal manifestations [facility])      Past Surgical History: History reviewed. No pertinent surgical history.   Family History: Family History  Problem Relation Age of Onset  . Stroke Brother      Social History: Social History   Socioeconomic History  . Marital status: Unknown    Spouse name: Not on file  . Number of children: Not on file  . Years of education: Not on file  . Highest education level: Not on file  Occupational History  . Not on file  Tobacco Use  . Smoking status: Former Smoker    Types: Cigarettes    Quit date: 07/31/2020    Years since quitting: 0.1  . Smokeless tobacco: Never Used  Substance and Sexual Activity  . Alcohol use: Not Currently    Comment: occ  . Drug use: Never  . Sexual activity: Not on file  Other Topics Concern  . Not on file  Social History Narrative  . Not on file   Social Determinants of Health   Financial Resource Strain: Not on file  Food Insecurity: Not on file  Transportation Needs: Not on file  Physical Activity: Not on  file  Stress: Not on file  Social Connections: Not on file  Intimate  Partner Violence: Not on file     Review of Systems: Review of Systems  Unable to perform ROS: Dementia    Vital Signs: Blood pressure (!) 145/75, pulse 97, temperature 98 F (36.7 C), temperature source Oral, resp. rate 18, height 5\' 4"  (1.626 m), weight 86.7 kg, SpO2 99 %.  Weight trends: Filed Weights   10/03/20 1808 10/04/20 0444 10/05/20 0502  Weight: 59 kg 84.8 kg 86.7 kg    Physical Exam: General: NAD,   Head: Normocephalic, atraumatic. Dry oral mucosal membranes  Eyes: Anicteric, PERRL  Neck: Supple, trachea midline  Lungs:  Clear to auscultation  Heart: Regular rate and rhythm  Abdomen:  Soft, nontender,   Extremities: no peripheral edema.  Neurologic: alert to self  Skin: No lesions        Lab results: Basic Metabolic Panel: Recent Labs  Lab 10/03/20 2121 10/04/20 0627 10/05/20 0556  NA 153* 162* 163*  K 3.6 3.6 3.0*  CL 122* 129* 129*  CO2 25 26 27   GLUCOSE 121* 138* 187*  BUN 12 12 11   CREATININE 0.81 0.73 0.87  CALCIUM 9.0 9.5 9.5    Liver Function Tests: No results for input(s): AST, ALT, ALKPHOS, BILITOT, PROT, ALBUMIN in the last 168 hours. No results for input(s): LIPASE, AMYLASE in the last 168 hours. No results for input(s): AMMONIA in the last 168 hours.  CBC: Recent Labs  Lab 10/03/20 1818  WBC 8.4  NEUTROABS 4.5  HGB 10.4*  HCT 35.7*  MCV 89.9  PLT 498*    Cardiac Enzymes: No results for input(s): CKTOTAL, CKMB, CKMBINDEX, TROPONINI in the last 168 hours.  BNP: Invalid input(s): POCBNP  CBG: Recent Labs  Lab 10/04/20 1538  GLUCAP 173*    Microbiology: Results for orders placed or performed during the hospital encounter of 10/03/20  SARS CORONAVIRUS 2 (TAT 6-24 HRS) Nasopharyngeal Nasopharyngeal Swab     Status: None   Collection Time: 10/03/20  7:56 PM   Specimen: Nasopharyngeal Swab  Result Value Ref Range Status   SARS Coronavirus 2  NEGATIVE NEGATIVE Final    Comment: (NOTE) SARS-CoV-2 target nucleic acids are NOT DETECTED.  The SARS-CoV-2 RNA is generally detectable in upper and lower respiratory specimens during the acute phase of infection. Negative results do not preclude SARS-CoV-2 infection, do not rule out co-infections with other pathogens, and should not be used as the sole basis for treatment or other patient management decisions. Negative results must be combined with clinical observations, patient history, and epidemiological information. The expected result is Negative.  Fact Sheet for Patients: SugarRoll.be  Fact Sheet for Healthcare Providers: https://www.woods-mathews.com/  This test is not yet approved or cleared by the [place] FDA and  has been authorized for detection and/or diagnosis of SARS-CoV-2 by FDA under an Emergency Use Authorization (EUA). This EUA will remain  in effect (meaning this test can be used) for the duration of the COVID-19 declaration under Se ction 564(b)(1) of the Act, 21 U.S.C. section 360bbb-3(b)(1), unless the authorization is terminated or revoked sooner.  Performed at [facility], [place] [address], [place], [place] [address]     Coagulation Studies: No results for input(s): LABPROT, INR in the last 72 hours.  Urinalysis: Recent Labs    10/04/20 0415  COLORURINE COLORLESS*  LABSPEC 1.006  PHURINE 6.0  GLUCOSEU NEGATIVE  HGBUR SMALL*  BILIRUBINUR NEGATIVE  KETONESUR NEGATIVE  PROTEINUR NEGATIVE  NITRITE NEGATIVE  LEUKOCYTESUR NEGATIVE      Imaging: CT ANGIOGRAM HEAD  NECK W WO CONTRAST  Result Date: 10/03/2020 CLINICAL DATA:  Stroke follow-up. Right arm shaking. EXAM: CT ANGIOGRAPHY HEAD AND NECK TECHNIQUE: Multidetector CT imaging of the head and neck was performed using the standard protocol during bolus administration of intravenous contrast. Multiplanar CT image reconstructions and MIPs were  obtained to evaluate the vascular anatomy. Carotid stenosis measurements (when applicable) are obtained utilizing NASCET criteria, using the distal internal carotid diameter as the denominator. CONTRAST:  70mL OMNIPAQUE IOHEXOL 350 MG/ML SOLN COMPARISON:  None. FINDINGS: CTA NECK FINDINGS SKELETON: There is no bony spinal canal stenosis. No lytic or blastic lesion. OTHER NECK: Normal pharynx, larynx and major salivary glands. No cervical lymphadenopathy. Unremarkable thyroid gland. UPPER CHEST: No pneumothorax or pleural effusion. No nodules or masses. AORTIC ARCH: There is calcific atherosclerosis of the aortic arch. There is no aneurysm, dissection or hemodynamically significant stenosis of the visualized portion of the aorta. Conventional 3 vessel aortic branching pattern. The visualized proximal subclavian arteries are widely patent. RIGHT CAROTID SYSTEM: No dissection, occlusion or aneurysm. There is predominantly calcified atherosclerosis extending into the proximal ICA, resulting in approximately 70% stenosis. LEFT CAROTID SYSTEM: No dissection, occlusion or aneurysm. There is calcific atherosclerosis extending into the proximal ICA, resulting in less than 50% stenosis. VERTEBRAL ARTERIES: Right dominant configuration. Both origins are clearly patent. There is no dissection, occlusion or flow-limiting stenosis to the skull base (V1-V3 segments). CTA HEAD FINDINGS POSTERIOR CIRCULATION: --Vertebral arteries: Normal V4 segments. --Inferior cerebellar arteries: Normal. --Basilar artery: Normal. --Superior cerebellar arteries: Normal. --Posterior cerebral arteries (PCA): Normal. ANTERIOR CIRCULATION: --Intracranial internal carotid arteries: Atherosclerotic calcification of the internal carotid arteries at the skull base with mild-to-moderate stenosis worse at the distal right cavernous segment. --Anterior cerebral arteries (ACA): Normal. Both A1 segments are present. Patent anterior communicating artery  (a-comm). --Middle cerebral arteries (MCA): Normal. VENOUS SINUSES: As permitted by contrast timing, patent. ANATOMIC VARIANTS: None Review of the MIP images confirms the above findings. IMPRESSION: 1. No intracranial arterial occlusion or high-grade stenosis. 2. 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 Aortic Atherosclerosis (ICD10-I70.0). Electronically Signed   By: Ulyses Jarred M.D.   On: 10/03/2020 23:49   CT Head Wo Contrast  Result Date: 10/03/2020 CLINICAL DATA:  Shaking, history of brain tumor EXAM: CT HEAD WITHOUT CONTRAST TECHNIQUE: Contiguous axial images were obtained from the base of the skull through the vertex without intravenous contrast. COMPARISON:  08/19/2020 FINDINGS: Brain: Chronic right MCA territory infarction involving the frontal lobe and insula. There is gyriform hyperdensity in the right occipital lobe. Hypoattenuation likely reflecting surgical cavity is present in the right cerebellum underlying craniectomy and cranioplasty without mass effect. Limited evaluation for tumor on this study. Prominence of the ventricles and sulci reflects generalized parenchymal volume loss. Additional patchy hypoattenuation in the supratentorial white matter is nonspecific but probably reflects stable chronic microvascular ischemic changes. Vascular: No hyperdense vessel.There is atherosclerotic calcification at the skull base. Skull: Right lateral suboccipital craniectomy with cranioplasty. Small right frontal burr hole. Sinuses/Orbits: No acute finding. Other: None. IMPRESSION: Gyriform hyperdensity in the right occipital lobe likely reflecting interval, now subacute infarction with laminar necrosis or petechial hemorrhage. Postoperative changes in the posterior fossa. 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 changes. Electronically Signed   By: Macy Mis M.D.   On: 10/03/2020 19:03    MR BRAIN WO CONTRAST  Result Date: 10/03/2020 CLINICAL DATA:  Tremor EXAM: MRI HEAD WITHOUT CONTRAST TECHNIQUE: Multiplanar, multiecho pulse sequences  of the brain and surrounding structures were obtained without intravenous contrast. COMPARISON:  08/20/2020 FINDINGS: Brain: There is a small acute infarct within the left frontal lobe along the left precentral gyrus. There are old infarcts of the right frontal operculum and right occipital lobe. There are postsurgical changes of right cerebellum. Chronic blood products at the right cerebellum There is multifocal hyperintense T2-weighted signal within the white matter. Generalized volume loss without a clear lobar predilection. The midline structures are normal. Vascular: Major flow voids are preserved. Skull and upper cervical spine: Right retromastoid cranioplasty. Sinuses/Orbits:No paranasal sinus fluid levels or advanced mucosal thickening. No mastoid or middle ear effusion. Normal orbits. IMPRESSION: 1. Small acute infarct of the left precentral gyrus. No hemorrhage or mass effect. 2. Old infarcts of the right frontal operculum and right occipital lobe. Electronically Signed   By: Ulyses Jarred M.D.   On: 10/03/2020 21:43   US Venous Img Lower Bilateral (DVT)  Result Date: 10/04/2020 CLINICAL DATA:  History of DVT EXAM: BILATERAL LOWER EXTREMITY VENOUS DOPPLER ULTRASOUND TECHNIQUE: Gray-scale sonography with graded compression, as well as color Doppler and duplex ultrasound were performed to evaluate the lower extremity deep venous systems from the level of the common femoral vein and including the common femoral, femoral, profunda femoral, popliteal and calf veins including the posterior tibial, peroneal and gastrocnemius veins when visible. The superficial great saphenous vein was also interrogated. Spectral Doppler was utilized to evaluate flow at rest and with distal augmentation maneuvers in the common femoral, femoral and popliteal veins.  COMPARISON:  None. FINDINGS: RIGHT LOWER EXTREMITY Common Femoral Vein: No evidence of thrombus. Normal compressibility, respiratory phasicity and response to augmentation. Saphenofemoral Junction: No evidence of thrombus. Normal compressibility and flow on color Doppler imaging. Profunda Femoral Vein: No evidence of thrombus. Normal compressibility and flow on color Doppler imaging. Femoral Vein: No evidence of thrombus. Normal compressibility, respiratory phasicity and response to augmentation. Popliteal Vein: No evidence of thrombus. Normal compressibility, respiratory phasicity and response to augmentation. Calf Veins: No evidence of thrombus. Normal compressibility and flow on color Doppler imaging. Superficial Great Saphenous Vein: No evidence of thrombus. Normal compressibility. Venous Reflux:  None. Other Findings:  None. LEFT LOWER EXTREMITY Common Femoral Vein: No evidence of thrombus. Normal compressibility, respiratory phasicity and response to augmentation. Saphenofemoral Junction: No evidence of thrombus. Normal compressibility and flow on color Doppler imaging. Profunda Femoral Vein: No evidence of thrombus. Normal compressibility and flow on color Doppler imaging. Femoral Vein: No evidence of thrombus. Normal compressibility, respiratory phasicity and response to augmentation. Popliteal Vein: No evidence of thrombus. Normal compressibility, respiratory phasicity and response to augmentation. Calf Veins: No evidence of thrombus. Normal compressibility and flow on color Doppler imaging. Superficial Great Saphenous Vein: No evidence of thrombus. Normal compressibility. Venous Reflux:  None. Other Findings:  None. IMPRESSION: No evidence of deep venous thrombosis in either lower extremity. Electronically Signed   By: Inez Catalina M.D.   On: 10/04/2020 00:29   EEG adult  Result Date: 10/04/2020 Lora Havens, MD     10/04/2020  3:47 PM Patient Name: Areyanna Figeroa MRN: [id number] Epilepsy Attending:  Lora Havens Referring Physician/Provider: Dr Rosalin Hawking Date: 10/04/2020 Duration: 38.25 mins Patient history: 75 y.o. 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 . EEG to evaluate for seizure Level of alertness:  lethargic AEDs during EEG study: LEV Technical  aspects: This EEG study was done with scalp electrodes positioned according to the 10-20 International system of electrode placement. Electrical activity was acquired at a sampling rate of 500Hz  and reviewed with a high frequency filter of 70Hz  and a low frequency filter of 1Hz . EEG data were recorded continuously and digitally stored. Description: No posterior dominant rhythm was seen. EEG showed continuous generalized 3 to 6 Hz theta-delta slowing.  Hyperventilation and photic stimulation were not performed.   Patient was also noted to have right>left head and neck twithcing per eeg technician ( not able to visualize on camera) during eeg. Concomitant eeg  before, during and after the event didn't show any eeg change to suggest seizure ABNORMALITY - Continuous slow, generalized IMPRESSION: This study is suggestive of moderate diffuse encephalopathy, nonspecific etiology. Patient was also noted to have right>left head and neck twitching without concomitant eeg change and therefore most likely not epileptic. However, focal motor seizure may not seen on scalp eeg . Therefore, clinical correlation is recommended. Dr Erlinda Hong ws notified. Lora Havens   ECHOCARDIOGRAM COMPLETE  Result Date: 10/04/2020    ECHOCARDIOGRAM REPORT   Patient Name:   NICKY MILHOUSE Date of Exam: 10/04/2020 Medical Rec #:  [id number]   Height:       64.0 in Accession #:    [id number]  Weight:       187.0 lb Date of Birth:  12-28-1945   BSA:          1.901 m Patient Age:    12 years    BP:           111/86 mmHg Patient Gender: F           HR:            105 bpm. Exam Location:  [facility] Procedure: 2D Echo, Cardiac Doppler and Color Doppler Indications:     Stroke I63.9  History:         Patient has no prior history of Echocardiogram examinations.                  Risk Factors:Hypertension and Diabetes.  Sonographer:     Sherrie Sport RDCS (AE) Referring Phys:  [id number] Rosalin Hawking Diagnosing Phys: Kate Sable MD  Sonographer Comments: Suboptimal apical window. IMPRESSIONS  1. Left ventricular ejection fraction, by estimation, is 60 to 65%. The left ventricle has normal function. The left ventricle has no regional wall motion abnormalities. Left ventricular diastolic parameters are consistent with Grade I diastolic dysfunction (impaired relaxation).  2. Right ventricular systolic function is normal. The right ventricular size is normal.  3. The mitral valve is normal in structure. No evidence of mitral valve regurgitation.  4. The aortic valve is tricuspid. Aortic valve regurgitation is not visualized. Mild to moderate aortic valve sclerosis/calcification is present, without any evidence of aortic stenosis.  5. The inferior vena cava is normal in size with greater than 50% respiratory variability, suggesting right atrial pressure of 3 mmHg. FINDINGS  Left Ventricle: Left ventricular ejection fraction, by estimation, is 60 to 65%. The left ventricle has normal function. The left ventricle has no regional wall motion abnormalities. The left ventricular internal cavity size was normal in size. There is  no left ventricular hypertrophy. Left ventricular diastolic parameters are consistent with Grade I diastolic dysfunction (impaired relaxation). Right Ventricle: The right ventricular size is normal. No increase in right ventricular wall thickness. Right ventricular systolic function is normal. Left Atrium: Left atrial size was normal in size.  Right Atrium: Right atrial size was normal in size. Pericardium: There is no evidence of pericardial effusion. Mitral Valve:  The mitral valve is normal in structure. No evidence of mitral valve regurgitation. Tricuspid Valve: The tricuspid valve is normal in structure. Tricuspid valve regurgitation is trivial. Aortic Valve: The aortic valve is tricuspid. Aortic valve regurgitation is not visualized. Mild to moderate aortic valve sclerosis/calcification is present, without any evidence of aortic stenosis. Aortic valve mean gradient measures 4.0 mmHg. Aortic valve peak gradient measures 6.1 mmHg. Aortic valve area, by VTI measures 4.20 cm. Pulmonic Valve: The pulmonic valve was normal in structure. Pulmonic valve regurgitation is not visualized. Aorta: The aortic root is normal in size and structure. Venous: The inferior vena cava is normal in size with greater than 50% respiratory variability, suggesting right atrial pressure of 3 mmHg. IAS/Shunts: No atrial level shunt detected by color flow Doppler.  LEFT VENTRICLE PLAX 2D LVIDd:         3.45 cm  Diastology LVIDs:         2.04 cm  LV e' medial:    5.33 cm/s LV PW:         1.60 cm  LV E/e' medial:  9.2 LV IVS:        1.27 cm  LV e' lateral:   10.20 cm/s LVOT diam:     2.00 cm  LV E/e' lateral: 4.8 LV SV:         59 LV SV Index:   31 LVOT Area:     3.14 cm  RIGHT VENTRICLE RV Basal diam:  3.66 cm LEFT ATRIUM           Index       RIGHT ATRIUM           Index LA diam:      2.40 cm 1.26 cm/m  RA Area:     18.80 cm LA Vol (A2C): 22.0 ml 11.57 ml/m RA Volume:   49.20 ml  25.88 ml/m LA Vol (A4C): 41.1 ml 21.62 ml/m  AORTIC VALVE                   PULMONIC VALVE AV Area (Vmax):    2.94 cm    PV Vmax:        1.11 m/s AV Area (Vmean):   2.95 cm    PV Peak grad:   4.9 mmHg AV Area (VTI):     4.20 cm    RVOT Peak grad: 6 mmHg AV Vmax:           123.00 cm/s AV Vmean:          88.900 cm/s AV VTI:            0.140 m AV Peak Grad:      6.1 mmHg AV Mean Grad:      4.0 mmHg LVOT Vmax:         115.00 cm/s LVOT Vmean:        83.600 cm/s LVOT VTI:          0.187 m LVOT/AV VTI ratio: 1.34  AORTA Ao  Root diam: 2.40 cm MITRAL VALVE               TRICUSPID VALVE MV Area (PHT): 7.29 cm    TR Peak grad:   26.0 mmHg MV Decel Time: 104 msec    TR Vmax:        255.00 cm/s MV E velocity: 49.30 cm/s MV A velocity: 81.40  cm/s  SHUNTS MV E/A ratio:  0.61        Systemic VTI:  0.19 m                            Systemic Diam: 2.00 cm Kate Sable MD Electronically signed by Kate Sable MD Signature Date/Time: 10/04/2020/4:54:49 PM    Final       Assessment & Plan: Ms. Cheyenne Anthony is a 75 y.o. black female skilled nursing home resident with metastatic lung cancer, status post craniotomy and tumor resection, hypertension, diabetes mellitus type II, hyperlipidemia, adrenal insufficiency, PE/DVT, who was admitted to [facility] on 10/03/2020 for Shaking [R25.1] CVA (cerebral vascular accident) ([place]) [S01.0] Embolic stroke ([place]) [X32.3] Cerebrovascular accident (CVA), unspecified mechanism ([place]) [I63.9]  1. Hypernatremia: with free water wasting and central diabetes insipidus. Calculated free water deficit of 6.4 liters Volume depleted on examination - DDAVP intranasaly 63mcg bid - Dextrose infusion - holding hydrochlorothiazide  2. Hypertension: blood pressure 145/75. Home regimen of lisinopril, hydrochlorothiazide, and metoprolol.   3. Adrenal insufficiency:  - hydrocortisone  4. Hypokalemia: potassium chloride.     LOS: 1 Sarath Kolluru 3/10/20229:31 AM

## 2020-10-05 NOTE — Progress Notes (Signed)
PT Cancellation Note  Patient Details Name: Cheyenne Anthony MRN: [id number] DOB: 08/27/1945   Cancelled Treatment:    Reason Eval/Treat Not Completed: Medical issues which prohibited therapy (Na+ this AM  in 160s, remains too high to partake in PT services at this time. Will continue to follow and evaluate once medically appropriate.)   [name] C 10/05/2020, 8:03 AM

## 2020-10-05 NOTE — Consult Note (Signed)
Consultation Note Date: 10/05/2020   Patient Name: Cheyenne Anthony  DOB: 1945/09/08  MRN: [id number]  Age / Sex: 75 y.o., female  PCP: [facility] Referring Physician: Lorella Nimrod, MD  Reason for Consultation: Establishing goals of care  HPI/Patient Profile: 75 y.o. 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 MRI reveals small left precentral gyrus acute infarct. Concern for seizures with acute infarct and intracrainial metastatic disease s/p craniotomy. Neuro following and started on Keppra. EEG suggestive of moderate diffuse encephalopathy, nonspecific etiology. Patient was also noted to have right>left head and neck twitching without concomitant eeg change and therefore most likely not epileptic. However, focal motor seizure may not seen on scalp eeg . Nephrology following for hypernatremia, polyuria concern of DI receiving DDAVP. Followed by [facility] with previous note documenting her poor candidacy for chemotherapy due to poor functional status. Palliative medicine consultation for goals of care.   Clinical Assessment and Goals of Care: I have reviewed medical records, discussed with care team, and met with patient at bedside. She is sleeping comfortably. Nursing reports she is more awake today compared to admission and ate 100% of breakfast for daughter who was at bedside this morning. Not at bedside during NP visit. Does not appear to be in pain or discomfort.   Patient has a documented HCPOA in EMR. Daughter, Cheyenne Anthony is documented primary HCPOA and daughter Simonne Maffucci is secondary HCPOA. This  documentation was completed in January. Patient did not complete living will documentation.   I introduced Palliative Medicine as specialized medical care for people living with serious illness. It focuses on providing relief from the symptoms and stress of a serious illness. The goal is to improve quality of life for both the patient and the family.  We discussed a brief life review of the patient. Prior to admission in January 2022, Cheyenne Anthony reports her mother was living home with her and doing well. She was requiring a walker for ambulation due to arthritic knees. Cheyenne Anthony reports a decline in her mother's health starting at the end of November, when she noted decreased energy, poor oral intake, and short-term memory loss/confusion. Cancer was diagnosed in January 2022 when she was rushed to [facility] for craniotomy. Patient is followed by [facility] and daughter confirms she completed radiation.   Discussed events leading up to admission. Patient has unfortunately been in and out of the hospital and rehab since January. Discussed course of hospitalization including diagnoses, interventions, plan of care.   I attempted to elicit values and goals of care important to the patient and daughter. Cheyenne Anthony shares that her mother ate 100% of breakfast when she was at bedside this morning and recognized her. Cheyenne Anthony is hopeful for ongoing improvement. She does acknowledge oncology telling her that her mother  was not previously a candidate for chemotherapy due to functional status. The patient was scheduled for [facility] f/u today, but unfortunately is hospitalized.    Reviewed [facility] note. Cheyenne Anthony was unaware a renal mass was found last admission.   Attempted to discuss advanced directives and concepts specific to code status. Cheyenne Anthony confirms her mother's previously documented wishes on MOST form from June 2021 requesting FULL code/FULL scope treatment. Unable to go into further detail and education this afternoon  regarding code status and wishes because Cheyenne Anthony is at work and had to rush off the phone. She has PMT provider desk number and I encouraged she call me back this afternoon if possible to further discuss.    SUMMARY OF RECOMMENDATIONS    Continue full code/full scope treatment.  Previously documented HCPOA paperwork in EMR. Daughter (Cheyenne Anthony) primary HCPOA.  Previously documented MOST form in EMR from June 2021 with patient's wishes for FULL code/FULL scope treatment.  Daughter plans to continue outpatient [facility] follow-up. Consider [facility] consultation if patient requires prolonged hospitalization. I do feel like daughter needs more guidance on diagnoses, options, and prognosis from oncology.   Recommend outpatient palliative at SNF on discharge.   Code Status/Advance Care Planning:  Full code  Symptom Management:   Per attending  Palliative Prophylaxis:   Aspiration, Delirium Protocol, Frequent Pain Assessment, Oral Care and Turn Reposition  Psycho-social/Spiritual:   Desire for further Chaplaincy support: yes  Additional Recommendations: Caregiving  Support/Resources, Compassionate Wean Education and Education on Hospice  Prognosis:   Poor long-term  Discharge Planning: To Be Determined      Primary Diagnoses: Present on Admission: . Lung cancer with cerebellar metastases s/p craniotomy 08/02/20 at [facility] . Hypertension . Type II diabetes mellitus with renal manifestations ([place]) . 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 CVA (cerebral vascular accident) ([place])   I have reviewed the medical record, interviewed the patient and family, and examined the patient. The following aspects are pertinent.  Past Medical History:  Diagnosis Date  . Adrenal insufficiency, primary ([place]) 10/05/2020  . Brain mass 07/31/2020  . Cancer ([place])   . Hypertension   . Right lower lobe lung mass   . Type II diabetes mellitus with renal manifestations [facility])    Social History    Socioeconomic History  . Marital status: Unknown    Spouse name: Not on file  . Number of children: Not on file  . Years of education: Not on file  . Highest education level: Not on file  Occupational History  . Not on file  Tobacco Use  . Smoking status: Former Smoker    Types: Cigarettes    Quit date: 07/31/2020    Years since quitting: 0.1  . Smokeless tobacco: Never Used  Substance and Sexual Activity  . Alcohol use: Not Currently    Comment: occ  . Drug use: Never  . Sexual activity: Not on file  Other Topics Concern  . Not on file  Social History Narrative  . Not on file   Social Determinants of Health   Financial Resource Strain: Not on file  Food Insecurity: Not on file  Transportation Needs: Not on file  Physical Activity: Not on file  Stress: Not on file  Social Connections: Not on file   Family History  Problem Relation Age of Onset  . Stroke Brother    Scheduled Meds: .  stroke: mapping our early stages of recovery book   Does not apply Once  .  aspirin EC  81 mg Oral Daily  . atorvastatin  20 mg Oral Daily  . desmopressin  1 spray Nasal BID  . diclofenac Sodium  4 g Topical QID  . insulin aspart  0-5 Units Subcutaneous QHS  . insulin aspart  0-9 Units Subcutaneous TID WC  . potassium chloride  40 mEq Oral BID   Continuous Infusions: . dextrose 150 mL/hr at 10/05/20 1025  . heparin 900 Units/hr (10/05/20 1025)  . levETIRAcetam 500 mg (10/05/20 1159)   PRN Meds:.acetaminophen **OR** acetaminophen (TYLENOL) oral liquid 160 mg/5 mL **OR** acetaminophen, LORazepam Medications Prior to Admission:  Prior to Admission medications   Medication Sig Start Date End Date Taking? Authorizing Provider  apixaban (ELIQUIS) 5 MG TABS tablet Take 5 mg by mouth every 12 (twelve) hours. 09/25/20  Yes [provider]  atorvastatin (LIPITOR) 40 MG tablet Take 40 mg by mouth daily.   Yes [provider]  DULoxetine (CYMBALTA) 60 MG capsule Take 60 mg  by mouth daily.   Yes [provider]  latanoprost (XALATAN) 0.005 % ophthalmic solution Place 1 drop into both eyes at bedtime.   Yes [provider]  potassium chloride SA (KLOR-CON) 20 MEQ tablet Take 20 mEq by mouth daily.   Yes [provider]  senna-docusate (SENOKOT-S) 8.6-50 MG tablet Take 2 tablets by mouth in the morning and at bedtime. 08/09/20  Yes [provider]  sitaGLIPtin-metformin (JANUMET) 50-500 MG tablet Take 1 tablet by mouth in the morning and at bedtime.   Yes [provider]  traMADol (ULTRAM) 50 MG tablet Take 50 mg by mouth 3 (three) times daily between meals as needed for pain. 10/03/20  Yes [provider]  ACETAMINOPHEN EXTRA STRENGTH 500 MG tablet Take 1,000 mg by mouth every 8 (eight) hours. Patient not taking: Reported on 10/04/2020 08/09/20   [provider]  hydrocortisone (CORTEF) 10 MG tablet Take 1 tablet (10 mg total) by mouth daily. With Breakfast Patient not taking: Reported on 10/04/2020 08/25/20   Jennye Boroughs, MD  hydrocortisone (CORTEF) 5 MG tablet Take 1 tablet (5 mg total) by mouth daily. At 2 PM Patient not taking: Reported on 10/04/2020 08/25/20   Jennye Boroughs, MD  lisinopril-hydrochlorothiazide (ZESTORETIC) 20-25 MG tablet Take 1 tablet by mouth daily. Patient not taking: Reported on 10/04/2020    [provider]  metoprolol succinate (TOPROL-XL) 50 MG 24 hr tablet Take 50 mg by mouth daily. Take with or immediately following a meal. Patient not taking: Reported on 10/04/2020    [provider]  MIRALAX 17 g packet Take 17 g by mouth daily. Patient not taking: Reported on 10/04/2020 08/09/20   [provider]  NIFEdipine (ADALAT CC) 90 MG 24 hr tablet Take 90 mg by mouth daily. Patient not taking: Reported on 10/04/2020    [provider]   No Known Allergies Review of Systems  Unable to perform ROS: Acuity of condition   Physical Exam Vitals and nursing note  reviewed.  Constitutional:      General: She is sleeping.  HENT:     Head: Normocephalic and atraumatic.  Pulmonary:     Effort: No tachypnea, accessory muscle usage or respiratory distress.  Skin:    General: Skin is warm and dry.  Neurological:     Comments: Easily aroused, oriented to person  Psychiatric:        Attention and Perception: She is inattentive.        Speech: Speech is delayed.  Vital Signs: BP 124/81 (BP Location: Left Arm)   Pulse 91   Temp 98.4 F (36.9 C) (Oral)   Resp 18   Ht '5\' 4"'  (1.626 m)   Wt 86.7 kg   SpO2 96%   BMI 32.81 kg/m  Pain Scale: 0-10   Pain Score: Asleep   SpO2: SpO2: 96 % O2 Device:SpO2: 96 % O2 Flow Rate: .   IO: Intake/output summary:   Intake/Output Summary (Last 24 hours) at 10/05/2020 1518 Last data filed at 10/05/2020 1025 Gross per 24 hour  Intake 2409.6 ml  Output 3450 ml  Net -1040.4 ml    LBM: Last BM Date: 10/05/20 Baseline Weight: Weight: 59 kg Most recent weight: Weight: 86.7 kg     Palliative Assessment/Data: PPS 30%     Time Total: 40 Greater than 50%  of this time was spent counseling and coordinating care related to the above assessment and plan.  Signed by:  Ihor Dow, DNP, FNP-C Palliative Medicine Team  Phone: [phone] Fax: [phone]  Please contact Palliative Medicine Team phone at [phone] for questions and concerns.  For individual provider: See Shea Evans

## 2020-10-05 NOTE — Consult Note (Addendum)
[facility] for heparin Indication: stroke and hx of DVT/PE.   No Known Allergies  Patient Measurements: Height: 5\' 4"  (162.6 cm) Weight: 86.7 kg (191 lb 2.2 oz) IBW/kg (Calculated) : 54.7 Heparin Dosing Weight: 74 kg  Vital Signs: Temp: 98.3 F (36.8 C) (03/10 0502) Temp Source: Oral (03/10 0502) BP: 153/100 (03/10 0502) Pulse Rate: 98 (03/10 0502)  Labs: Recent Labs    10/03/20 1818 10/03/20 2121 10/04/20 0627 10/05/20 0556  HGB 10.4*  --   --   --   HCT 35.7*  --   --   --   PLT 498*  --   --   --   CREATININE 0.84 0.81 0.73 0.87    Estimated Creatinine Clearance: 61.4 mL/min (by C-G formula based on SCr of 0.87 mg/dL).   Medical History: Past Medical History:  Diagnosis Date  . Brain mass 07/31/2020  . Cancer ([place])   . Hypertension   . Right lower lobe lung mass   . Type II diabetes mellitus with renal manifestations (H[facility])     Medications:  Medications Prior to Admission  Medication Sig Dispense Refill Last Dose  . apixaban (ELIQUIS) 5 MG TABS tablet Take 5 mg by mouth every 12 (twelve) hours.   Unknown at Unknown  . atorvastatin (LIPITOR) 40 MG tablet Take 40 mg by mouth daily.   Unknown at Unknown  . DULoxetine (CYMBALTA) 60 MG capsule Take 60 mg by mouth daily.   Unknown at Unknown  . latanoprost (XALATAN) 0.005 % ophthalmic solution Place 1 drop into both eyes at bedtime.   Unknown at Unknown  . potassium chloride SA (KLOR-CON) 20 MEQ tablet Take 20 mEq by mouth daily.   Unknown at Unknown  . senna-docusate (SENOKOT-S) 8.6-50 MG tablet Take 2 tablets by mouth in the morning and at bedtime.   Unknown at Unknown  . sitaGLIPtin-metformin (JANUMET) 50-500 MG tablet Take 1 tablet by mouth in the morning and at bedtime.   Unknown at Unknown  . traMADol (ULTRAM) 50 MG tablet Take 50 mg by mouth 3 (three) times daily between meals as needed for pain.   Unknown at Unknown  . ACETAMINOPHEN EXTRA STRENGTH 500 MG tablet Take  1,000 mg by mouth every 8 (eight) hours. (Patient not taking: Reported on 10/04/2020)   Not Taking at Unknown  . hydrocortisone (CORTEF) 10 MG tablet Take 1 tablet (10 mg total) by mouth daily. With Breakfast (Patient not taking: Reported on 10/04/2020)   Not Taking at Unknown time  . hydrocortisone (CORTEF) 5 MG tablet Take 1 tablet (5 mg total) by mouth daily. At 2 PM (Patient not taking: Reported on 10/04/2020)   Not Taking at Unknown time  . lisinopril-hydrochlorothiazide (ZESTORETIC) 20-25 MG tablet Take 1 tablet by mouth daily. (Patient not taking: Reported on 10/04/2020)   Not Taking at Unknown time  . metoprolol succinate (TOPROL-XL) 50 MG 24 hr tablet Take 50 mg by mouth daily. Take with or immediately following a meal. (Patient not taking: Reported on 10/04/2020)   Not Taking at Unknown time  . MIRALAX 17 g packet Take 17 g by mouth daily. (Patient not taking: Reported on 10/04/2020)   Not Taking at Unknown time  . NIFEdipine (ADALAT CC) 90 MG 24 hr tablet Take 90 mg by mouth daily. (Patient not taking: Reported on 10/04/2020)   Not Taking at Unknown time   Scheduled:  .  stroke: mapping our early stages of recovery book   Does not  apply Once  . aspirin EC  81 mg Oral Daily  . atorvastatin  20 mg Oral Daily  . desmopressin  1 spray Nasal BID  . diclofenac Sodium  4 g Topical QID  . insulin aspart  0-5 Units Subcutaneous QHS  . insulin aspart  0-9 Units Subcutaneous TID WC  . potassium chloride  40 mEq Oral BID   Infusions:  . dextrose 150 mL/hr at 10/04/20 1759  . levETIRAcetam 500 mg (10/04/20 2357)   PRN: acetaminophen **OR** acetaminophen (TYLENOL) oral liquid 160 mg/5 mL **OR** acetaminophen, LORazepam Anti-infectives (From admission, onward)   None      Assessment: Pharmacy was consulted to start heparin. Pt was on apixaban PTA for hx of VTE. Pt is admitted for concern for stroke. Will start heparin until pt is stabilized and plan to switch back to apixaban. Will order baseline HL,  aPTT, and INR.   Goal of Therapy:  aPTT 66-85 seconds when switch to Heparin level 0.3 - 0.5 once heparin level and aPTT are correlating.  Monitor platelets by anticoagulation protocol: Yes   Plan:  No bolus.  Start heparin infusion at 900 units/hr Check aPTT level in 8 hours. Heparin and CBC with AM labs.  Continue to monitor H&H and platelets  Oswald Hillock 10/05/2020,8:37 AM

## 2020-10-05 NOTE — Consult Note (Addendum)
[facility] for heparin Indication: stroke and hx of DVT/PE.   No Known Allergies  Patient Measurements: Height: 5\' 4"  (162.6 cm) Weight: 86.7 kg (191 lb 2.2 oz) IBW/kg (Calculated) : 54.7 Heparin Dosing Weight: 74 kg  Vital Signs: Temp: 98 F (36.7 C) (03/10 1755) Temp Source: Oral (03/10 1726) BP: 129/90 (03/10 1755) Pulse Rate: 103 (03/10 1755)  Labs: Recent Labs    10/03/20 1818 10/03/20 2121 10/05/20 0556 10/05/20 1007 10/05/20 1655  HGB 10.4*  --   --  10.3*  --   HCT 35.7*  --   --  35.8*  --   PLT 498*  --   --  507*  --   APTT  --   --   --  41* 66*  LABPROT  --   --   --  14.7  --   INR  --   --   --  1.2  --   HEPARINUNFRC  --   --   --  0.68  --   CREATININE 0.84   < > 0.87 0.89 0.95   < > = values in this interval not displayed.    Estimated Creatinine Clearance: 56.2 mL/min (by C-G formula based on SCr of 0.95 mg/dL).   Medical History: Past Medical History:  Diagnosis Date  . Adrenal insufficiency, primary ([place]) 10/05/2020  . Brain mass 07/31/2020  . Cancer ([place])   . Hypertension   . Right lower lobe lung mass   . Type II diabetes mellitus with renal manifestations (H[facility])     Medications:  Medications Prior to Admission  Medication Sig Dispense Refill Last Dose  . apixaban (ELIQUIS) 5 MG TABS tablet Take 5 mg by mouth every 12 (twelve) hours.   Unknown at Unknown  . atorvastatin (LIPITOR) 40 MG tablet Take 40 mg by mouth daily.   Unknown at Unknown  . DULoxetine (CYMBALTA) 60 MG capsule Take 60 mg by mouth daily.   Unknown at Unknown  . latanoprost (XALATAN) 0.005 % ophthalmic solution Place 1 drop into both eyes at bedtime.   Unknown at Unknown  . potassium chloride SA (KLOR-CON) 20 MEQ tablet Take 20 mEq by mouth daily.   Unknown at Unknown  . senna-docusate (SENOKOT-S) 8.6-50 MG tablet Take 2 tablets by mouth in the morning and at bedtime.   Unknown at Unknown  . sitaGLIPtin-metformin (JANUMET) 50-500 MG  tablet Take 1 tablet by mouth in the morning and at bedtime.   Unknown at Unknown  . traMADol (ULTRAM) 50 MG tablet Take 50 mg by mouth 3 (three) times daily between meals as needed for pain.   Unknown at Unknown  . ACETAMINOPHEN EXTRA STRENGTH 500 MG tablet Take 1,000 mg by mouth every 8 (eight) hours. (Patient not taking: Reported on 10/04/2020)   Not Taking at Unknown  . hydrocortisone (CORTEF) 10 MG tablet Take 1 tablet (10 mg total) by mouth daily. With Breakfast (Patient not taking: Reported on 10/04/2020)   Not Taking at Unknown time  . hydrocortisone (CORTEF) 5 MG tablet Take 1 tablet (5 mg total) by mouth daily. At 2 PM (Patient not taking: Reported on 10/04/2020)   Not Taking at Unknown time  . lisinopril-hydrochlorothiazide (ZESTORETIC) 20-25 MG tablet Take 1 tablet by mouth daily. (Patient not taking: Reported on 10/04/2020)   Not Taking at Unknown time  . metoprolol succinate (TOPROL-XL) 50 MG 24 hr tablet Take 50 mg by mouth daily. Take with or immediately following a meal. (  Patient not taking: Reported on 10/04/2020)   Not Taking at Unknown time  . MIRALAX 17 g packet Take 17 g by mouth daily. (Patient not taking: Reported on 10/04/2020)   Not Taking at Unknown time  . NIFEdipine (ADALAT CC) 90 MG 24 hr tablet Take 90 mg by mouth daily. (Patient not taking: Reported on 10/04/2020)   Not Taking at Unknown time   Scheduled:  .  stroke: mapping our early stages of recovery book   Does not apply Once  . aspirin EC  81 mg Oral Daily  . atorvastatin  20 mg Oral Daily  . desmopressin  1 spray Nasal BID  . diclofenac Sodium  4 g Topical QID  . insulin aspart  0-5 Units Subcutaneous QHS  . insulin aspart  0-9 Units Subcutaneous TID WC  . potassium chloride  40 mEq Oral BID   Infusions:  . dextrose 150 mL/hr at 10/05/20 1644  . heparin 900 Units/hr (10/05/20 1025)  . levETIRAcetam 500 mg (10/05/20 1159)   PRN: acetaminophen **OR** acetaminophen (TYLENOL) oral liquid 160 mg/5 mL **OR** acetaminophen,  LORazepam Anti-infectives (From admission, onward)   None      Assessment: Pharmacy was consulted to start heparin. Pt was on apixaban PTA for hx of VTE. Pt is admitted for concern for stroke. Will start heparin until pt is stabilized and plan to switch back to apixaban. Baseline HL 0.68, aPTT 41, and INR 1.2.   Goal of Therapy:  aPTT 66-85 seconds then switch to Heparin level 0.3 - 0.5 once heparin level and aPTT are correlating.  Monitor platelets by anticoagulation protocol: Yes   0310 @ 1655 HL 66, therapeutic @ 900 units/hr   Plan:  No bolus.  Will increase heparin infusion rate to 950 units/hr since level lower limit of therapeutic. Check aPTT level in 8 hours. Heparin and CBC with AM labs.  Continue to monitor H&H and platelets  Benn Moulder, PharmD Pharmacy Resident  10/05/2020 6:12 PM

## 2020-10-05 NOTE — Progress Notes (Addendum)
MD Reesa Chew notified of critical lab value of 161.    No new orders at this time

## 2020-10-05 NOTE — Progress Notes (Signed)
STROKE TEAM PROGRESS NOTE   SUBJECTIVE (INTERVAL HISTORY) No family is at the bedside.  Overall her condition is rapidly improving. Pt today lethagic but arousable, no facial or arm twitching, mental status much improved, following all simple commands. Still has diluted urine, but Na only elevated from 162->163, has been on DDAVP nasal spray. EEG repeat pending   OBJECTIVE Temp:  [97.8 F (36.6 C)-99.4 F (37.4 C)] 98.3 F (36.8 C) (03/10 0502) Pulse Rate:  [98-107] 98 (03/10 0502) Cardiac Rhythm: Sinus tachycardia (03/09 1957) Resp:  [17-20] 18 (03/10 0502) BP: (107-153)/(67-100) 153/100 (03/10 0502) SpO2:  [92 %-97 %] 95 % (03/10 0502) Weight:  [86.7 kg] 86.7 kg (03/10 0502)  Recent Labs  Lab 10/04/20 1538  GLUCAP 173*   Recent Labs  Lab 10/03/20 1818 10/03/20 2121 10/04/20 0627 10/05/20 0556  NA 151* 153* 162* 163*  K 3.7 3.6 3.6 3.0*  CL 119* 122* 129* 129*  CO2 23 25 26 27   GLUCOSE 137* 121* 138* 187*  BUN 12 12 12 11   CREATININE 0.84 0.81 0.73 0.87  CALCIUM 9.3 9.0 9.5 9.5   No results for input(s): AST, ALT, ALKPHOS, BILITOT, PROT, ALBUMIN in the last 168 hours. Recent Labs  Lab 10/03/20 1818  WBC 8.4  NEUTROABS 4.5  HGB 10.4*  HCT 35.7*  MCV 89.9  PLT 498*   No results for input(s): CKTOTAL, CKMB, CKMBINDEX, TROPONINI in the last 168 hours. No results for input(s): LABPROT, INR in the last 72 hours. Recent Labs    10/04/20 0415  Vancleave 1.006  PHURINE 6.0  GLUCOSEU NEGATIVE  HGBUR SMALL*  BILIRUBINUR NEGATIVE  KETONESUR NEGATIVE  PROTEINUR NEGATIVE  NITRITE NEGATIVE  LEUKOCYTESUR NEGATIVE       Component Value Date/Time   CHOL 89 10/04/2020 0627   TRIG 134 10/04/2020 0627   HDL 40 (L) 10/04/2020 0627   CHOLHDL 2.2 10/04/2020 0627   VLDL 27 10/04/2020 0627   LDLCALC 22 10/04/2020 0627   Lab Results  Component Value Date   HGBA1C 7.4 (H) 10/04/2020   No results found for: LABOPIA, COCAINSCRNUR, LABBENZ,  AMPHETMU, THCU, LABBARB  No results for input(s): ETH in the last 168 hours.  I have personally reviewed the radiological images below and agree with the radiology interpretations.  CT [facility] HEAD NECK W WO CONTRAST  Result Date: 10/03/2020 CLINICAL DATA:  Stroke follow-up. Right arm shaking. EXAM: CT ANGIOGRAPHY HEAD AND NECK TECHNIQUE: Multidetector CT imaging of the head and neck was performed using the standard protocol during bolus administration of intravenous contrast. Multiplanar CT image reconstructions and MIPs were obtained to evaluate the vascular anatomy. Carotid stenosis measurements (when applicable) are obtained utilizing NASCET criteria, using the distal internal carotid diameter as the denominator. CONTRAST:  73mL OMNIPAQUE IOHEXOL 350 MG/ML SOLN COMPARISON:  None. FINDINGS: CTA NECK FINDINGS SKELETON: There is no bony spinal canal stenosis. No lytic or blastic lesion. OTHER NECK: Normal pharynx, larynx and major salivary glands. No cervical lymphadenopathy. Unremarkable thyroid gland. UPPER CHEST: No pneumothorax or pleural effusion. No nodules or masses. AORTIC ARCH: There is calcific atherosclerosis of the aortic arch. There is no aneurysm, dissection or hemodynamically significant stenosis of the visualized portion of the aorta. Conventional 3 vessel aortic branching pattern. The visualized proximal subclavian arteries are widely patent. RIGHT CAROTID SYSTEM: No dissection, occlusion or aneurysm. There is predominantly calcified atherosclerosis extending into the proximal ICA, resulting in approximately 70% stenosis. LEFT CAROTID SYSTEM: No dissection, occlusion or aneurysm. There is calcific  atherosclerosis extending into the proximal ICA, resulting in less than 50% stenosis. VERTEBRAL ARTERIES: Right dominant configuration. Both origins are clearly patent. There is no dissection, occlusion or flow-limiting stenosis to the skull base (V1-V3 segments). CTA HEAD FINDINGS POSTERIOR  CIRCULATION: --Vertebral arteries: Normal V4 segments. --Inferior cerebellar arteries: Normal. --Basilar artery: Normal. --Superior cerebellar arteries: Normal. --Posterior cerebral arteries (PCA): Normal. ANTERIOR CIRCULATION: --Intracranial internal carotid arteries: Atherosclerotic calcification of the internal carotid arteries at the skull base with mild-to-moderate stenosis worse at the distal right cavernous segment. --Anterior cerebral arteries (ACA): Normal. Both A1 segments are present. Patent anterior communicating artery (a-comm). --Middle cerebral arteries (MCA): Normal. VENOUS SINUSES: As permitted by contrast timing, patent. ANATOMIC VARIANTS: None Review of the MIP images confirms the above findings. IMPRESSION: 1. No intracranial arterial occlusion or high-grade stenosis. 2. 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 Aortic Atherosclerosis (ICD10-I70.0). Electronically Signed   By: Ulyses Jarred M.D.   On: 10/03/2020 23:49   CT Head Wo Contrast  Result Date: 10/03/2020 CLINICAL DATA:  Shaking, history of brain tumor EXAM: CT HEAD WITHOUT CONTRAST TECHNIQUE: Contiguous axial images were obtained from the base of the skull through the vertex without intravenous contrast. COMPARISON:  08/19/2020 FINDINGS: Brain: Chronic right MCA territory infarction involving the frontal lobe and insula. There is gyriform hyperdensity in the right occipital lobe. Hypoattenuation likely reflecting surgical cavity is present in the right cerebellum underlying craniectomy and cranioplasty without mass effect. Limited evaluation for tumor on this study. Prominence of the ventricles and sulci reflects generalized parenchymal volume loss. Additional patchy hypoattenuation in the supratentorial white matter is nonspecific but probably reflects stable chronic microvascular ischemic changes. Vascular: No hyperdense vessel.There is  atherosclerotic calcification at the skull base. Skull: Right lateral suboccipital craniectomy with cranioplasty. Small right frontal burr hole. Sinuses/Orbits: No acute finding. Other: None. IMPRESSION: Gyriform hyperdensity in the right occipital lobe likely reflecting interval, now subacute infarction with laminar necrosis or petechial hemorrhage. Postoperative changes in the posterior fossa. 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 changes. Electronically Signed   By: Macy Mis M.D.   On: 10/03/2020 19:03   MR BRAIN WO CONTRAST  Result Date: 10/03/2020 CLINICAL DATA:  Tremor EXAM: MRI HEAD WITHOUT CONTRAST TECHNIQUE: Multiplanar, multiecho pulse sequences of the brain and surrounding structures were obtained without intravenous contrast. COMPARISON:  08/20/2020 FINDINGS: Brain: There is a small acute infarct within the left frontal lobe along the left precentral gyrus. There are old infarcts of the right frontal operculum and right occipital lobe. There are postsurgical changes of right cerebellum. Chronic blood products at the right cerebellum There is multifocal hyperintense T2-weighted signal within the white matter. Generalized volume loss without a clear lobar predilection. The midline structures are normal. Vascular: Major flow voids are preserved. Skull and upper cervical spine: Right retromastoid cranioplasty. Sinuses/Orbits:No paranasal sinus fluid levels or advanced mucosal thickening. No mastoid or middle ear effusion. Normal orbits. IMPRESSION: 1. Small acute infarct of the left precentral gyrus. No hemorrhage or mass effect. 2. Old infarcts of the right frontal operculum and right occipital lobe. Electronically Signed   By: Ulyses Jarred M.D.   On: 10/03/2020 21:43   US Venous Img Lower Bilateral (DVT)  Result Date: 10/04/2020 CLINICAL DATA:  History of DVT EXAM: BILATERAL LOWER EXTREMITY VENOUS DOPPLER ULTRASOUND TECHNIQUE: Gray-scale sonography with graded  compression, as well as color Doppler and duplex ultrasound were performed to evaluate the lower extremity deep venous systems  from the level of the common femoral vein and including the common femoral, femoral, profunda femoral, popliteal and calf veins including the posterior tibial, peroneal and gastrocnemius veins when visible. The superficial great saphenous vein was also interrogated. Spectral Doppler was utilized to evaluate flow at rest and with distal augmentation maneuvers in the common femoral, femoral and popliteal veins. COMPARISON:  None. FINDINGS: RIGHT LOWER EXTREMITY Common Femoral Vein: No evidence of thrombus. Normal compressibility, respiratory phasicity and response to augmentation. Saphenofemoral Junction: No evidence of thrombus. Normal compressibility and flow on color Doppler imaging. Profunda Femoral Vein: No evidence of thrombus. Normal compressibility and flow on color Doppler imaging. Femoral Vein: No evidence of thrombus. Normal compressibility, respiratory phasicity and response to augmentation. Popliteal Vein: No evidence of thrombus. Normal compressibility, respiratory phasicity and response to augmentation. Calf Veins: No evidence of thrombus. Normal compressibility and flow on color Doppler imaging. Superficial Great Saphenous Vein: No evidence of thrombus. Normal compressibility. Venous Reflux:  None. Other Findings:  None. LEFT LOWER EXTREMITY Common Femoral Vein: No evidence of thrombus. Normal compressibility, respiratory phasicity and response to augmentation. Saphenofemoral Junction: No evidence of thrombus. Normal compressibility and flow on color Doppler imaging. Profunda Femoral Vein: No evidence of thrombus. Normal compressibility and flow on color Doppler imaging. Femoral Vein: No evidence of thrombus. Normal compressibility, respiratory phasicity and response to augmentation. Popliteal Vein: No evidence of thrombus. Normal compressibility, respiratory phasicity and  response to augmentation. Calf Veins: No evidence of thrombus. Normal compressibility and flow on color Doppler imaging. Superficial Great Saphenous Vein: No evidence of thrombus. Normal compressibility. Venous Reflux:  None. Other Findings:  None. IMPRESSION: No evidence of deep venous thrombosis in either lower extremity. Electronically Signed   By: Inez Catalina M.D.   On: 10/04/2020 00:29   EEG adult  Result Date: 10/04/2020 Lora Havens, MD     10/04/2020  3:47 PM Patient Name: Cyndie Woodbeck MRN: [id number] Epilepsy Attending: Lora Havens Referring Physician/Provider: Dr Rosalin Hawking Date: 10/04/2020 Duration: 38.25 mins Patient history: 75 y.o. 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 . EEG to evaluate for seizure Level of alertness:  lethargic AEDs during EEG study: LEV Technical aspects: This EEG study was done with scalp electrodes positioned according to the 10-20 International system of electrode placement. Electrical activity was acquired at a sampling rate of 500Hz  and reviewed with a high frequency filter of 70Hz  and a low frequency filter of 1Hz . EEG data were recorded continuously and digitally stored. Description: No posterior dominant rhythm was seen. EEG showed continuous generalized 3 to 6 Hz theta-delta slowing.  Hyperventilation and photic stimulation were not performed.   Patient was also noted to have right>left head and neck twithcing per eeg technician ( not able to visualize on camera) during eeg. Concomitant eeg  before, during and after the event didn't show any eeg change to suggest seizure ABNORMALITY - Continuous slow, generalized IMPRESSION: This study is suggestive of moderate diffuse encephalopathy, nonspecific etiology. Patient was also noted to have right>left head and neck twitching without concomitant eeg change and therefore  most likely not epileptic. However, focal motor seizure may not seen on scalp eeg . Therefore, clinical correlation is recommended. Dr Erlinda Hong ws notified. Lora Havens   ECHOCARDIOGRAM COMPLETE  Result Date: 10/04/2020    ECHOCARDIOGRAM REPORT   Patient Name:   JOSEFITA WEISSMANN Date of  Exam: 10/04/2020 Medical Rec #:  [id number]   Height:       64.0 in Accession #:    [id number]  Weight:       187.0 lb Date of Birth:  03/15/1946   BSA:          1.901 m Patient Age:    75 years    BP:           111/86 mmHg Patient Gender: F           HR:           105 bpm. Exam Location:  [facility] Procedure: 2D Echo, Cardiac Doppler and Color Doppler Indications:     Stroke I63.9  History:         Patient has no prior history of Echocardiogram examinations.                  Risk Factors:Hypertension and Diabetes.  Sonographer:     Sherrie Sport RDCS (AE) Referring Phys:  [id number] Rosalin Hawking Diagnosing Phys: Kate Sable MD  Sonographer Comments: Suboptimal apical window. IMPRESSIONS  1. Left ventricular ejection fraction, by estimation, is 60 to 65%. The left ventricle has normal function. The left ventricle has no regional wall motion abnormalities. Left ventricular diastolic parameters are consistent with Grade I diastolic dysfunction (impaired relaxation).  2. Right ventricular systolic function is normal. The right ventricular size is normal.  3. The mitral valve is normal in structure. No evidence of mitral valve regurgitation.  4. The aortic valve is tricuspid. Aortic valve regurgitation is not visualized. Mild to moderate aortic valve sclerosis/calcification is present, without any evidence of aortic stenosis.  5. The inferior vena cava is normal in size with greater than 50% respiratory variability, suggesting right atrial pressure of 3 mmHg. FINDINGS  Left Ventricle: Left ventricular ejection fraction, by estimation, is 60 to 65%. The left ventricle has normal function. The left ventricle has no regional wall motion abnormalities.  The left ventricular internal cavity size was normal in size. There is  no left ventricular hypertrophy. Left ventricular diastolic parameters are consistent with Grade I diastolic dysfunction (impaired relaxation). Right Ventricle: The right ventricular size is normal. No increase in right ventricular wall thickness. Right ventricular systolic function is normal. Left Atrium: Left atrial size was normal in size. Right Atrium: Right atrial size was normal in size. Pericardium: There is no evidence of pericardial effusion. Mitral Valve: The mitral valve is normal in structure. No evidence of mitral valve regurgitation. Tricuspid Valve: The tricuspid valve is normal in structure. Tricuspid valve regurgitation is trivial. Aortic Valve: The aortic valve is tricuspid. Aortic valve regurgitation is not visualized. Mild to moderate aortic valve sclerosis/calcification is present, without any evidence of aortic stenosis. Aortic valve mean gradient measures 4.0 mmHg. Aortic valve peak gradient measures 6.1 mmHg. Aortic valve area, by VTI measures 4.20 cm. Pulmonic Valve: The pulmonic valve was normal in structure. Pulmonic valve regurgitation is not visualized. Aorta: The aortic root is normal in size and structure. Venous: The inferior vena cava is normal in size with greater than 50% respiratory variability, suggesting right atrial pressure of 3 mmHg. IAS/Shunts: No atrial level shunt detected by color flow Doppler.  LEFT VENTRICLE PLAX 2D LVIDd:         3.45 cm  Diastology LVIDs:         2.04 cm  LV e' medial:    5.33 cm/s LV PW:         1.60 cm  LV E/e' medial:  9.2 LV IVS:        1.27 cm  LV e' lateral:   10.20 cm/s LVOT diam:     2.00 cm  LV E/e' lateral: 4.8 LV SV:         59 LV SV Index:   31 LVOT Area:     3.14 cm  RIGHT VENTRICLE RV Basal diam:  3.66 cm LEFT ATRIUM           Index       RIGHT ATRIUM           Index LA diam:      2.40 cm 1.26 cm/m  RA Area:     18.80 cm LA Vol (A2C): 22.0 ml 11.57 ml/m RA  Volume:   49.20 ml  25.88 ml/m LA Vol (A4C): 41.1 ml 21.62 ml/m  AORTIC VALVE                   PULMONIC VALVE AV Area (Vmax):    2.94 cm    PV Vmax:        1.11 m/s AV Area (Vmean):   2.95 cm    PV Peak grad:   4.9 mmHg AV Area (VTI):     4.20 cm    RVOT Peak grad: 6 mmHg AV Vmax:           123.00 cm/s AV Vmean:          88.900 cm/s AV VTI:            0.140 m AV Peak Grad:      6.1 mmHg AV Mean Grad:      4.0 mmHg LVOT Vmax:         115.00 cm/s LVOT Vmean:        83.600 cm/s LVOT VTI:          0.187 m LVOT/AV VTI ratio: 1.34  AORTA Ao Root diam: 2.40 cm MITRAL VALVE               TRICUSPID VALVE MV Area (PHT): 7.29 cm    TR Peak grad:   26.0 mmHg MV Decel Time: 104 msec    TR Vmax:        255.00 cm/s MV E velocity: 49.30 cm/s MV A velocity: 81.40 cm/s  SHUNTS MV E/A ratio:  0.61        Systemic VTI:  0.19 m                            Systemic Diam: 2.00 cm Kate Sable MD Electronically signed by Kate Sable MD Signature Date/Time: 10/04/2020/4:54:49 PM    Final      PHYSICAL EXAM  Temp:  [97.8 F (36.6 C)-99.4 F (37.4 C)] 98.3 F (36.8 C) (03/10 0502) Pulse Rate:  [98-107] 98 (03/10 0502) Resp:  [17-20] 18 (03/10 0502) BP: (107-153)/(67-100) 153/100 (03/10 0502) SpO2:  [92 %-97 %] 95 % (03/10 0502) Weight:  [86.7 kg] 86.7 kg (03/10 0502)  General - well nourished, well developed, in no apparent distress.    Ophthalmologic - fundi not visualized due to noncooperation.    Cardiovascular - regular rhythm and rate  Neuro - awake alert, not orientated to place, told me she is 75 yo first and the corrected to 75 yo, not orientated to time. However, her speech much improved, able to speak simple sentence to answer questions, able to follow all simple commands. Able to  name and repeat. Blinking to visual threat bilaterally, no gaze palsy, no more facial twitching, right nasolabial fold mildly flattening, tongue midline. Bilateral upper extremity 4/5, no drift, no asterixis  bilaterally today.  Bilateral lower extremity significant pain on movement. RLE significant pain at popliteal area, 0/5 proximally, LLE knee flexion 2/5 and bilateral 2-3/5 toe movement. Sensation light touch subjectively symmetrical. FTN grossly intact bilaterally, gait not tested.    ASSESSMENT/PLAN Ms. [name] is a 75 y.o. 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   Diabetics insipidus   Na 144->151->153->162->163  Diluted urine with large amount outpt  Urine osmo 113 and blood osmo 342, urine Na 39  On D5 @ 150cc  On DDAVP nasal spray  Likely due to CNS etiology - cerebellar metastasis s/p resection and stroke  Nephrology on board  Encephalopathy, improved   Disorientated with b/l asterixis - much improved today   Facial twitching, R>L  3/9 EEG no obvious seizure, but focal seizure sometimes difficult to see from surface EEG - on keppra  3/11 EEG pending  Likely due to DI, stroke, underlying medical conditions  Treat underlying condition per primary team  Stroke:  left precentral gyrus infarct, embolic secondary to hypercoagulable state with advance cancer vs. Severe extra- and intracranial stenosis  CT head showed old right MCA stroke but also subacute to chronic right occipital infarct with laminar necrosis which was new from previous neuro imaging.    CTA head and neck showed significant b/l ICA bulb and siphon atherosclerosis with stenosis, R>L with 70% right ICA bulb stenosis.   MRI showed small left precentral gyrus infarct.   2D Echo EF 60-65%  DVT negative for DVT   LDL 22   A1C 7.4    lovenox for VTE prophylaxis  Eliquis (apixaban) daily prior to admission, now on heparin IV and ASA 81 given severe extra- and intracranial stenosis. Will transition to eliquis if no more procedure  planned.  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  2/17 admitted to [facility], found to have bilateral PE RV strain.  DVT showed positive left popliteal near occlusive DVT.  Discharged on eliquis  On Eliquis PTA  Now on heparin IV. If no procedure planned, will transition to eliquis   Diabetes  HgbA1c 7.4 goal < 7.0  Uncontrolled  CBG monitoring  SSI  close PCP follow up  Hypertension  Stable now  Long term BP goal 130-150 given severe extra- and intracranial stenosis  Hyperlipidemia  Home meds:  lipitor 40   LDL 22, goal < 70  Now decrease to lipitor 20, due to low level of LDL  Continue statin at discharge  Other Stroke Risk Factors  Advanced age  Obesity, Body mass index is 32.81 kg/m.   Hx stroke/TIA -  right MCA and right PCA with lamina necrosis  Other Active Problems  Renal mass - concerning for mets  Hospital day # 1  Rosalin Hawking, MD PhD Stroke Neurology 10/05/2020 7:58 AM    To contact Stroke Continuity provider, please refer to [email or web address] After hours, contact General Neurology

## 2020-10-05 NOTE — Progress Notes (Signed)
PROGRESS NOTE    Cheyenne Anthony  XLK:[id number] DOB: 1946-01-08 DOA: 10/03/2020 PCP: [facility]   Brief Narrative: Taken from H&P. Cheyenne Anthony is a 75 y.o. 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  MRI with a small left precentral gyrus acute infarct. Concern of seizures with acute infarct and intracranial metastatic disease s/p craniotomy. Neurology was consulted, she was loaded with Keppra followed by start of Keppra twice daily.  EEG done-was negative for any epileptiform activity but focal motor seizures cannot be excluded.  Worsening hyponatremia, very diluted polyuria, labs consistent with diabetes insipidus. Started her on desmopressin and nephrology was consulted.  Subjective: Patient seems more alert and talking when seen today.  Less twitching.  Per nursing staff she started eating.  Denies any new symptoms but overall feeling weak. Nursing concern of right ankle pain.  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  Imaging positive for acute precentral gyrus CVA.  That can it self trigger seizures, right sided twitching is concerning for focal seizure-like activity.  Neurology was consulted-they are loaded her with Keppra and she was started on twice daily dose of Keppra. History of recent craniotomy secondary to cerebellar mets at [facility] in January 2022. CVA work-up with A1c of 7.4, lipid panel with LDL of 22 and HDL of 40, echocardiogram with normal EF and grade 1 diastolic dysfunction.  No other significant abnormality. EEG was negative for any specific epileptiform waves but focal motor seizures cannot be excluded.  Clinically seems improving. -Completed CVA work-up. -Continue with Keppra -Continue to monitor for any new neurologic deficit -Patient will go back to SNF  Hypernatremia/polyuria/diabetes insipidus.  Worsening sodium at 163 today.  Appears dry.  Significant polyuria with very diluted urine.  Urine osmolality very less as compared to serum.  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 She was started on desmopressin and received 1 dose around midnight yesterday. -Increased desmopressin frequency to twice daily. -Continue D5 -Monitor sodium -Nephrology was consulted-appreciate their help.  Hypertension.  Blood pressure within goal. -Her home antihypertensives which include metoprolol, Zestoretic and nifedipine were held initially for permissive hypertension. -Can be restarted if needed now.  Type 2 diabetes mellitus with renal manifestations.  Uncontrolled with A1c of 7.4 -Continue with SSI  History of adrenal crisis in 08/2020.  Repeat a.m. cortisol today was within normal limit. Patient completed the tapering dose of prednisone.  History of lung cancer, metastatic to cerebellum status post craniotomy -No acute issues -She will continue outpatient follow-up with oncology. -Patient is very high risk for deterioration and death. -Palliative care was  consulted-had discussion with  daughter and she wants everything needs to be done for her mother, tried explaining that approach might harm her more than benefit.  Patient will remain full code along with full scope of care at this time.  Right ankle pain.  Imaging was obtained which was negative for any acute fractures but did show some small bony densities, consistent with loose bodies. -Symptomatic management   Objective: Vitals:   10/04/20 2350 10/05/20 0502 10/05/20 0911 10/05/20 1143  BP: 132/80 (!) 153/100 (!) 145/75 124/81  Pulse: (!) 102 98 97 91  Resp: 17 18    Temp: 99.4 F (37.4 C) 98.3 F (36.8 C) 98 F (36.7 C) 98.4 F (36.9 C)  TempSrc: Oral Oral Oral Oral  SpO2: 96% 95% 99% 96%  Weight:  86.7 kg    Height:        Intake/Output Summary (Last 24 hours) at 10/05/2020 1517 Last data filed at 10/05/2020 1025 Gross per 24 hour  Intake 2409.6 ml  Output 3450 ml  Net -1040.4 ml   Filed Weights   10/03/20 1808 10/04/20 0444 10/05/20 0502  Weight: 59 kg 84.8 kg 86.7 kg    Examination:  General.  Chronically ill-appearing lady, in no acute distress. Pulmonary.  Lungs clear bilaterally, normal respiratory effort. CV.  Regular rate and rhythm, no JVD, rub or murmur. Abdomen.  Soft, nontender, nondistended, BS positive. CNS.  Alert and oriented x3.  No focal neurologic deficit. Extremities.  No edema, no cyanosis, pulses intact and symmetrical. Psychiatry.  Judgment and insight appears normal.  DVT prophylaxis: Lovenox Code Status: Full Family Communication: Discussed with daughter on phone. Disposition Plan:  Status is: Inpatient  Remains inpatient appropriate because:Inpatient level of care appropriate due to severity of illness   Dispo: The patient is from: SNF              Anticipated d/c is to: SNF              Patient currently is not medically stable to d/c.   Difficult to place patient No              Level of care: Progressive Cardiac  All the  records are reviewed and case discussed with Care Management/Social Worker. Management plans discussed with the patient, nursing and they are in agreement.  Consultants:   Neurology  Nephrology  Palliative care  Procedures:  Antimicrobials:   Data Reviewed: I have personally reviewed following labs and imaging studies  CBC: Recent Labs  Lab 10/03/20 1818 10/05/20 1007  WBC 8.4 9.3  NEUTROABS 4.5  --   HGB 10.4* 10.3*  HCT 35.7* 35.8*  MCV 89.9 90.9  PLT 498* 062*   Basic Metabolic Panel: Recent Labs  Lab 10/03/20 1818 10/03/20 2121 10/04/20 0627 10/05/20 0556 10/05/20 1007  NA 151* 153* 162* 163* 161*  K 3.7 3.6 3.6 3.0* 3.2*  CL 119* 122* 129* 129* 128*  CO2 23 25 26 27 26   GLUCOSE 137* 121* 138* 187* 255*  BUN 12 12 12 11 12   CREATININE 0.84 0.81 0.73 0.87 0.89  CALCIUM 9.3 9.0 9.5 9.5 9.4   GFR: Estimated Creatinine Clearance: 60 mL/min (by C-G formula based on SCr of 0.89 mg/dL). Liver Function Tests: No results for input(s): AST, ALT, ALKPHOS, BILITOT, PROT, ALBUMIN in the last 168 hours. No results for input(s): LIPASE, AMYLASE in the last 168 hours. No results for input(s): AMMONIA in the last 168 hours. Coagulation Profile: Recent Labs  Lab 10/05/20 1007  INR  1.2   Cardiac Enzymes: No results for input(s): CKTOTAL, CKMB, CKMBINDEX, TROPONINI in the last 168 hours. BNP (last 3 results) No results for input(s): PROBNP in the last 8760 hours. HbA1C: Recent Labs    10/04/20 0627  HGBA1C 7.4*   CBG: Recent Labs  Lab 10/04/20 1538 10/05/20 1222  GLUCAP 173* 230*   Lipid Profile: Recent Labs    10/04/20 0627  CHOL 89  HDL 40*  LDLCALC 22  TRIG 134  CHOLHDL 2.2   Thyroid Function Tests: No results for input(s): TSH, T4TOTAL, FREET4, T3FREE, THYROIDAB in the last 72 hours. Anemia Panel: No results for input(s): VITAMINB12, FOLATE, FERRITIN, TIBC, IRON, RETICCTPCT in the last 72 hours. Sepsis Labs: No results for input(s):  PROCALCITON, LATICACIDVEN in the last 168 hours.  Recent Results (from the past 240 hour(s))  SARS CORONAVIRUS 2 (TAT 6-24 HRS) Nasopharyngeal Nasopharyngeal Swab     Status: None   Collection Time: 10/03/20  7:56 PM   Specimen: Nasopharyngeal Swab  Result Value Ref Range Status   SARS Coronavirus 2 NEGATIVE NEGATIVE Final    Comment: (NOTE) SARS-CoV-2 target nucleic acids are NOT DETECTED.  The SARS-CoV-2 RNA is generally detectable in upper and lower respiratory specimens during the acute phase of infection. Negative results do not preclude SARS-CoV-2 infection, do not rule out co-infections with other pathogens, and should not be used as the sole basis for treatment or other patient management decisions. Negative results must be combined with clinical observations, patient history, and epidemiological information. The expected result is Negative.  Fact Sheet for Patients: SugarRoll.be  Fact Sheet for Healthcare Providers: https://www.woods-mathews.com/  This test is not yet approved or cleared by the [place] FDA and  has been authorized for detection and/or diagnosis of SARS-CoV-2 by FDA under an Emergency Use Authorization (EUA). This EUA will remain  in effect (meaning this test can be used) for the duration of the COVID-19 declaration under Se ction 564(b)(1) of the Act, 21 U.S.C. section 360bbb-3(b)(1), unless the authorization is terminated or revoked sooner.  Performed at [facility], [place] [address] [place], [place] [address]      Radiology Studies: CT [facility] HEAD NECK W WO CONTRAST  Result Date: 10/03/2020 CLINICAL DATA:  Stroke follow-up. Right arm shaking. EXAM: CT ANGIOGRAPHY HEAD AND NECK TECHNIQUE: Multidetector CT imaging of the head and neck was performed using the standard protocol during bolus administration of intravenous contrast. Multiplanar CT image reconstructions and MIPs were obtained to  evaluate the vascular anatomy. Carotid stenosis measurements (when applicable) are obtained utilizing NASCET criteria, using the distal internal carotid diameter as the denominator. CONTRAST:  19mL OMNIPAQUE IOHEXOL 350 MG/ML SOLN COMPARISON:  None. FINDINGS: CTA NECK FINDINGS SKELETON: There is no bony spinal canal stenosis. No lytic or blastic lesion. OTHER NECK: Normal pharynx, larynx and major salivary glands. No cervical lymphadenopathy. Unremarkable thyroid gland. UPPER CHEST: No pneumothorax or pleural effusion. No nodules or masses. AORTIC ARCH: There is calcific atherosclerosis of the aortic arch. There is no aneurysm, dissection or hemodynamically significant stenosis of the visualized portion of the aorta. Conventional 3 vessel aortic branching pattern. The visualized proximal subclavian arteries are widely patent. RIGHT CAROTID SYSTEM: No dissection, occlusion or aneurysm. There is predominantly calcified atherosclerosis extending into the proximal ICA, resulting in approximately 70% stenosis. LEFT CAROTID SYSTEM: No dissection, occlusion or aneurysm. There is calcific atherosclerosis extending into the proximal ICA, resulting in less than 50% stenosis. VERTEBRAL ARTERIES: Right dominant configuration. Both origins are clearly patent.  There is no dissection, occlusion or flow-limiting stenosis to the skull base (V1-V3 segments). CTA HEAD FINDINGS POSTERIOR CIRCULATION: --Vertebral arteries: Normal V4 segments. --Inferior cerebellar arteries: Normal. --Basilar artery: Normal. --Superior cerebellar arteries: Normal. --Posterior cerebral arteries (PCA): Normal. ANTERIOR CIRCULATION: --Intracranial internal carotid arteries: Atherosclerotic calcification of the internal carotid arteries at the skull base with mild-to-moderate stenosis worse at the distal right cavernous segment. --Anterior cerebral arteries (ACA): Normal. Both A1 segments are present. Patent anterior communicating artery (a-comm). --Middle  cerebral arteries (MCA): Normal. VENOUS SINUSES: As permitted by contrast timing, patent. ANATOMIC VARIANTS: None Review of the MIP images confirms the above findings. IMPRESSION: 1. No intracranial arterial occlusion or high-grade stenosis. 2. 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 Aortic Atherosclerosis (ICD10-I70.0). Electronically Signed   By: Ulyses Jarred M.D.   On: 10/03/2020 23:49   DG Ankle Complete Right  Result Date: 10/05/2020 CLINICAL DATA:  Right ankle pain. EXAM: RIGHT ANKLE - COMPLETE 3+ VIEW COMPARISON:  No prior. FINDINGS: Diffuse soft tissue swelling. Severe diffuse degenerative changes. Corticated small bony densities noted about the right tibiotalar joint consistent with loose bodies. No evidence of acute fracture or dislocation. Dystrophic soft tissue calcifications noted. No radiopaque foreign body. IMPRESSION: Diffuse soft tissue swelling. Severe diffuse degenerative changes. Corticated small bony densities noted about the right tibiotalar joint consistent with loose bodies. No evidence of acute fracture or dislocation. Electronically Signed   By: Marcello Moores  Register   On: 10/05/2020 10:40   CT Head Wo Contrast  Result Date: 10/03/2020 CLINICAL DATA:  Shaking, history of brain tumor EXAM: CT HEAD WITHOUT CONTRAST TECHNIQUE: Contiguous axial images were obtained from the base of the skull through the vertex without intravenous contrast. COMPARISON:  08/19/2020 FINDINGS: Brain: Chronic right MCA territory infarction involving the frontal lobe and insula. There is gyriform hyperdensity in the right occipital lobe. Hypoattenuation likely reflecting surgical cavity is present in the right cerebellum underlying craniectomy and cranioplasty without mass effect. Limited evaluation for tumor on this study. Prominence of the ventricles and sulci reflects generalized parenchymal volume loss. Additional patchy  hypoattenuation in the supratentorial white matter is nonspecific but probably reflects stable chronic microvascular ischemic changes. Vascular: No hyperdense vessel.There is atherosclerotic calcification at the skull base. Skull: Right lateral suboccipital craniectomy with cranioplasty. Small right frontal burr hole. Sinuses/Orbits: No acute finding. Other: None. IMPRESSION: Gyriform hyperdensity in the right occipital lobe likely reflecting interval, now subacute infarction with laminar necrosis or petechial hemorrhage. Postoperative changes in the posterior fossa. 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 changes. Electronically Signed   By: Macy Mis M.D.   On: 10/03/2020 19:03   MR BRAIN WO CONTRAST  Result Date: 10/03/2020 CLINICAL DATA:  Tremor EXAM: MRI HEAD WITHOUT CONTRAST TECHNIQUE: Multiplanar, multiecho pulse sequences of the brain and surrounding structures were obtained without intravenous contrast. COMPARISON:  08/20/2020 FINDINGS: Brain: There is a small acute infarct within the left frontal lobe along the left precentral gyrus. There are old infarcts of the right frontal operculum and right occipital lobe. There are postsurgical changes of right cerebellum. Chronic blood products at the right cerebellum There is multifocal hyperintense T2-weighted signal within the white matter. Generalized volume loss without a clear lobar predilection. The midline structures are normal. Vascular: Major flow voids are preserved. Skull and upper cervical spine: Right retromastoid cranioplasty. Sinuses/Orbits:No paranasal sinus fluid levels or advanced mucosal thickening. No mastoid or middle ear effusion. Normal orbits. IMPRESSION: 1. Small acute  infarct of the left precentral gyrus. No hemorrhage or mass effect. 2. Old infarcts of the right frontal operculum and right occipital lobe. Electronically Signed   By: Ulyses Jarred M.D.   On: 10/03/2020 21:43   US Venous Img Lower  Bilateral (DVT)  Result Date: 10/04/2020 CLINICAL DATA:  History of DVT EXAM: BILATERAL LOWER EXTREMITY VENOUS DOPPLER ULTRASOUND TECHNIQUE: Gray-scale sonography with graded compression, as well as color Doppler and duplex ultrasound were performed to evaluate the lower extremity deep venous systems from the level of the common femoral vein and including the common femoral, femoral, profunda femoral, popliteal and calf veins including the posterior tibial, peroneal and gastrocnemius veins when visible. The superficial great saphenous vein was also interrogated. Spectral Doppler was utilized to evaluate flow at rest and with distal augmentation maneuvers in the common femoral, femoral and popliteal veins. COMPARISON:  None. FINDINGS: RIGHT LOWER EXTREMITY Common Femoral Vein: No evidence of thrombus. Normal compressibility, respiratory phasicity and response to augmentation. Saphenofemoral Junction: No evidence of thrombus. Normal compressibility and flow on color Doppler imaging. Profunda Femoral Vein: No evidence of thrombus. Normal compressibility and flow on color Doppler imaging. Femoral Vein: No evidence of thrombus. Normal compressibility, respiratory phasicity and response to augmentation. Popliteal Vein: No evidence of thrombus. Normal compressibility, respiratory phasicity and response to augmentation. Calf Veins: No evidence of thrombus. Normal compressibility and flow on color Doppler imaging. Superficial Great Saphenous Vein: No evidence of thrombus. Normal compressibility. Venous Reflux:  None. Other Findings:  None. LEFT LOWER EXTREMITY Common Femoral Vein: No evidence of thrombus. Normal compressibility, respiratory phasicity and response to augmentation. Saphenofemoral Junction: No evidence of thrombus. Normal compressibility and flow on color Doppler imaging. Profunda Femoral Vein: No evidence of thrombus. Normal compressibility and flow on color Doppler imaging. Femoral Vein: No evidence of  thrombus. Normal compressibility, respiratory phasicity and response to augmentation. Popliteal Vein: No evidence of thrombus. Normal compressibility, respiratory phasicity and response to augmentation. Calf Veins: No evidence of thrombus. Normal compressibility and flow on color Doppler imaging. Superficial Great Saphenous Vein: No evidence of thrombus. Normal compressibility. Venous Reflux:  None. Other Findings:  None. IMPRESSION: No evidence of deep venous thrombosis in either lower extremity. Electronically Signed   By: Inez Catalina M.D.   On: 10/04/2020 00:29   EEG adult  Result Date: 10/04/2020 Lora Havens, MD     10/04/2020  3:47 PM Patient Name: Cheyenne Anthony MRN: [id number] Epilepsy Attending: Lora Havens Referring Physician/Provider: Dr Rosalin Hawking Date: 10/04/2020 Duration: 38.25 mins Patient history: 75 y.o. 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 . EEG to evaluate for seizure Level of alertness:  lethargic AEDs during EEG study: LEV Technical aspects: This EEG study was done with scalp electrodes positioned according to the 10-20 International system of electrode placement. Electrical activity was acquired at a sampling rate of 500Hz  and reviewed with a high frequency filter of 70Hz  and a low frequency filter of 1Hz . EEG data were recorded continuously and digitally stored. Description: No posterior dominant rhythm was seen. EEG showed continuous generalized 3 to 6 Hz theta-delta slowing.  Hyperventilation and photic stimulation were not performed.   Patient was also noted to have right>left head and neck twithcing per eeg technician ( not able to visualize on camera) during eeg. Concomitant eeg  before, during and after the event didn't show any eeg change  to suggest seizure ABNORMALITY - Continuous slow, generalized IMPRESSION: This study is  suggestive of moderate diffuse encephalopathy, nonspecific etiology. Patient was also noted to have right>left head and neck twitching without concomitant eeg change and therefore most likely not epileptic. However, focal motor seizure may not seen on scalp eeg . Therefore, clinical correlation is recommended. Dr Erlinda Hong ws notified. Lora Havens   ECHOCARDIOGRAM COMPLETE  Result Date: 10/04/2020    ECHOCARDIOGRAM REPORT   Patient Name:   Cheyenne Anthony Date of Exam: 10/04/2020 Medical Rec #:  [id number]   Height:       64.0 in Accession #:    [id number]  Weight:       187.0 lb Date of Birth:  Sep 21, 1945   BSA:          1.901 m Patient Age:    85 years    BP:           111/86 mmHg Patient Gender: F           HR:           105 bpm. Exam Location:  [facility] Procedure: 2D Echo, Cardiac Doppler and Color Doppler Indications:     Stroke I63.9  History:         Patient has no prior history of Echocardiogram examinations.                  Risk Factors:Hypertension and Diabetes.  Sonographer:     Sherrie Sport RDCS (AE) Referring Phys:  [id number] Rosalin Hawking Diagnosing Phys: Kate Sable MD  Sonographer Comments: Suboptimal apical window. IMPRESSIONS  1. Left ventricular ejection fraction, by estimation, is 60 to 65%. The left ventricle has normal function. The left ventricle has no regional wall motion abnormalities. Left ventricular diastolic parameters are consistent with Grade I diastolic dysfunction (impaired relaxation).  2. Right ventricular systolic function is normal. The right ventricular size is normal.  3. The mitral valve is normal in structure. No evidence of mitral valve regurgitation.  4. The aortic valve is tricuspid. Aortic valve regurgitation is not visualized. Mild to moderate aortic valve sclerosis/calcification is present, without any evidence of aortic stenosis.  5. The inferior vena cava is normal in size with greater than 50% respiratory variability, suggesting right atrial pressure of 3 mmHg. FINDINGS  Left  Ventricle: Left ventricular ejection fraction, by estimation, is 60 to 65%. The left ventricle has normal function. The left ventricle has no regional wall motion abnormalities. The left ventricular internal cavity size was normal in size. There is  no left ventricular hypertrophy. Left ventricular diastolic parameters are consistent with Grade I diastolic dysfunction (impaired relaxation). Right Ventricle: The right ventricular size is normal. No increase in right ventricular wall thickness. Right ventricular systolic function is normal. Left Atrium: Left atrial size was normal in size. Right Atrium: Right atrial size was normal in size. Pericardium: There is no evidence of pericardial effusion. Mitral Valve: The mitral valve is normal in structure. No evidence of mitral valve regurgitation. Tricuspid Valve: The tricuspid valve is normal in structure. Tricuspid valve regurgitation is trivial. Aortic Valve: The aortic valve is tricuspid. Aortic valve regurgitation is not visualized. Mild to moderate aortic valve sclerosis/calcification is present, without any evidence of aortic stenosis. Aortic valve mean gradient measures 4.0 mmHg. Aortic valve peak gradient measures 6.1 mmHg. Aortic valve area, by VTI measures 4.20 cm. Pulmonic Valve: The pulmonic valve was normal in structure. Pulmonic valve regurgitation is not visualized. Aorta: The aortic root is normal  in size and structure. Venous: The inferior vena cava is normal in size with greater than 50% respiratory variability, suggesting right atrial pressure of 3 mmHg. IAS/Shunts: No atrial level shunt detected by color flow Doppler.  LEFT VENTRICLE PLAX 2D LVIDd:         3.45 cm  Diastology LVIDs:         2.04 cm  LV e' medial:    5.33 cm/s LV PW:         1.60 cm  LV E/e' medial:  9.2 LV IVS:        1.27 cm  LV e' lateral:   10.20 cm/s LVOT diam:     2.00 cm  LV E/e' lateral: 4.8 LV SV:         59 LV SV Index:   31 LVOT Area:     3.14 cm  RIGHT VENTRICLE RV Basal  diam:  3.66 cm LEFT ATRIUM           Index       RIGHT ATRIUM           Index LA diam:      2.40 cm 1.26 cm/m  RA Area:     18.80 cm LA Vol (A2C): 22.0 ml 11.57 ml/m RA Volume:   49.20 ml  25.88 ml/m LA Vol (A4C): 41.1 ml 21.62 ml/m  AORTIC VALVE                   PULMONIC VALVE AV Area (Vmax):    2.94 cm    PV Vmax:        1.11 m/s AV Area (Vmean):   2.95 cm    PV Peak grad:   4.9 mmHg AV Area (VTI):     4.20 cm    RVOT Peak grad: 6 mmHg AV Vmax:           123.00 cm/s AV Vmean:          88.900 cm/s AV VTI:            0.140 m AV Peak Grad:      6.1 mmHg AV Mean Grad:      4.0 mmHg LVOT Vmax:         115.00 cm/s LVOT Vmean:        83.600 cm/s LVOT VTI:          0.187 m LVOT/AV VTI ratio: 1.34  AORTA Ao Root diam: 2.40 cm MITRAL VALVE               TRICUSPID VALVE MV Area (PHT): 7.29 cm    TR Peak grad:   26.0 mmHg MV Decel Time: 104 msec    TR Vmax:        255.00 cm/s MV E velocity: 49.30 cm/s MV A velocity: 81.40 cm/s  SHUNTS MV E/A ratio:  0.61        Systemic VTI:  0.19 m                            Systemic Diam: 2.00 cm Kate Sable MD Electronically signed by Kate Sable MD Signature Date/Time: 10/04/2020/4:54:49 PM    Final     Scheduled Meds: .  stroke: mapping our early stages of recovery book   Does not apply Once  . aspirin EC  81 mg Oral Daily  . atorvastatin  20 mg Oral Daily  . desmopressin  1 spray Nasal BID  .  diclofenac Sodium  4 g Topical QID  . insulin aspart  0-5 Units Subcutaneous QHS  . insulin aspart  0-9 Units Subcutaneous TID WC  . potassium chloride  40 mEq Oral BID   Continuous Infusions: . dextrose 150 mL/hr at 10/05/20 1025  . heparin 900 Units/hr (10/05/20 1025)  . levETIRAcetam 500 mg (10/05/20 1159)     LOS: 1 day   Time spent: 35 minutes. More than 50% of the time was spent in counseling/coordination of care  Lorella Nimrod, MD [facility]  If 7PM-7AM, please contact night-coverage [email or web address]  10/05/2020, 3:17 PM   This record  has been created using Systems analyst. Errors have been sought and corrected,but may not always be located. Such creation errors do not reflect on the standard of care.

## 2020-10-05 NOTE — Progress Notes (Signed)
MD Reesa Chew made aware of Critical lab values in progression rounds at 1113. NA 161, Serum osmolality 342

## 2020-10-06 DIAGNOSIS — I639 Cerebral infarction, unspecified: Secondary | ICD-10-CM | POA: Diagnosis not present

## 2020-10-06 DIAGNOSIS — E232 Diabetes insipidus: Secondary | ICD-10-CM

## 2020-10-06 DIAGNOSIS — R251 Tremor, unspecified: Secondary | ICD-10-CM | POA: Diagnosis not present

## 2020-10-06 DIAGNOSIS — I63412 Cerebral infarction due to embolism of left middle cerebral artery: Secondary | ICD-10-CM | POA: Diagnosis not present

## 2020-10-06 DIAGNOSIS — C7931 Secondary malignant neoplasm of brain: Secondary | ICD-10-CM | POA: Diagnosis not present

## 2020-10-06 DIAGNOSIS — E870 Hyperosmolality and hypernatremia: Secondary | ICD-10-CM | POA: Diagnosis not present

## 2020-10-06 DIAGNOSIS — C3490 Malignant neoplasm of unspecified part of unspecified bronchus or lung: Secondary | ICD-10-CM | POA: Diagnosis not present

## 2020-10-06 LAB — BASIC METABOLIC PANEL
Anion gap: 7 (ref 5–15)
BUN: 12 mg/dL (ref 8–23)
BUN: 13 mg/dL (ref 8–23)
CO2: 24 mmol/L (ref 22–32)
CO2: 24 mmol/L (ref 22–32)
Calcium: 9.5 mg/dL (ref 8.9–10.3)
Calcium: 9.3 mg/dL (ref 8.9–10.3)
Chloride: >130 mmol/L (ref 98–111)
Chloride: 127 mmol/L — ABNORMAL HIGH (ref 98–111)
Creatinine, Ser: 0.85 mg/dL (ref 0.44–1.00)
Creatinine, Ser: 0.84 mg/dL (ref 0.44–1.00)
GFR, Estimated: >60 mL/min (ref >60)
GFR, Estimated: >60 mL/min (ref >60)
Glucose, Bld: 187 mg/dL — ABNORMAL HIGH (ref 70–99)
Glucose, Bld: 119 mg/dL — ABNORMAL HIGH (ref 70–99)
Potassium: 3.8 mmol/L (ref 3.5–5.1)
Potassium: 3.4 mmol/L — ABNORMAL LOW (ref 3.5–5.1)
Sodium: 163 mmol/L (ref 135–145)
Sodium: 158 mmol/L — ABNORMAL HIGH (ref 135–145)

## 2020-10-06 LAB — CBC
HCT: 36.8 % (ref 36.0–46.0)
Hemoglobin: 10.5 g/dL — ABNORMAL LOW (ref 12.0–15.0)
MCH: 26.2 pg (ref 26.0–34.0)
MCHC: 28.5 g/dL — ABNORMAL LOW (ref 30.0–36.0)
MCV: 91.8 fL (ref 80.0–100.0)
Platelets: 528 10*3/uL — ABNORMAL HIGH (ref 150–400)
RBC: 4.01 MIL/uL (ref 3.87–5.11)
RDW: 18 % — ABNORMAL HIGH (ref 11.5–15.5)
WBC: 9.7 10*3/uL (ref 4.0–10.5)
nRBC: 0 % (ref 0.0–0.2)

## 2020-10-06 LAB — HEPARIN LEVEL (UNFRACTIONATED): Heparin Unfractionated: 0.73 IU/mL — ABNORMAL HIGH (ref 0.30–0.70)

## 2020-10-06 LAB — APTT: aPTT: 72 seconds — ABNORMAL HIGH (ref 24–36)

## 2020-10-06 LAB — GLUCOSE, CAPILLARY
Glucose-Capillary: 191 mg/dL — ABNORMAL HIGH (ref 70–99)
Glucose-Capillary: 236 mg/dL — ABNORMAL HIGH (ref 70–99)
Glucose-Capillary: 264 mg/dL — ABNORMAL HIGH (ref 70–99)
Glucose-Capillary: 108 mg/dL — ABNORMAL HIGH (ref 70–99)
Glucose-Capillary: 168 mg/dL — ABNORMAL HIGH (ref 70–99)

## 2020-10-06 MED ORDER — DESMOPRESSIN ACE SPRAY REFRIG 0.01 % NA SOLN
2.0000 | Freq: Two times a day (BID) | NASAL | Status: DC
  Administered 2020-10-06 – 2020-10-15 (×17): 2 via NASAL

## 2020-10-06 MED ORDER — DESMOPRESSIN ACE SPRAY REFRIG 0.01 % NA SOLN: 2.0000 | Freq: Two times a day (BID) | NASAL | Status: DC

## 2020-10-06 MED ORDER — HYDROCHLOROTHIAZIDE 25 MG PO TABS
25.0000 mg | ORAL_TABLET | Freq: Every day | ORAL | Status: DC
  Administered 2020-10-06 – 2020-10-08 (×3): 25 mg via ORAL
  Filled 2020-10-06 (×3): qty 1

## 2020-10-06 MED ORDER — APIXABAN 5 MG PO TABS
5.0000 mg | ORAL_TABLET | Freq: Two times a day (BID) | ORAL | Status: DC
  Administered 2020-10-06 – 2020-10-16 (×21): 5 mg via ORAL
  Filled 2020-10-06 (×21): qty 1

## 2020-10-06 NOTE — Procedures (Signed)
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 . EEG to evaluate for seizure  Level of alertness:  lethargic  AEDs during EEG study: LEV  Technical aspects: This EEG study was done with scalp electrodes positioned according to the 10-20 International system of electrode placement. Electrical activity was acquired at a sampling rate of 500Hz  and reviewed with a high frequency filter of 70Hz  and a low frequency filter of 1Hz . EEG data were recorded continuously and digitally stored.   Description: No posterior dominant rhythm was seen. EEG showed continuous generalized 3 to 6 Hz theta-delta slowing. Photic driving was not seen during photic stimulation.  Hyperventilation was not performed.     ABNORMALITY - Continuous slow, generalized  IMPRESSION: This study is suggestive of moderate diffuse encephalopathy, nonspecific etiology. No seizures and epileptiform discharges were seen during this study  [facility]

## 2020-10-06 NOTE — Progress Notes (Addendum)
[place], [place] 10/06/20  Subjective:   LOS: 2  Cheyenne Anthony is a 75 y.o. female with past medical history of DDM, HTN, lunch cancer with mets to cerebellum, and s/p craniotomy at [facility]. She presents to ED after reported shaking episode of right arm and face.   She is admitted for Acute CVA. She has been started on Aspirin and Keppra.  We have been consulted for hypernatremia. Sodium was 162 on admission. Currently receiving DDAVP and on dextrose IVF.   Patient seen laying in bed. Withdrawn Denies nausea Denies shortness of breath. Patient seen at a later time and was bale to talk and answer questions  UOP-5.4L Hep drip 950 units/hr IVF-D5 @ 100 ml/hr   Objective:  Vital signs in last 24 hours:  Temp:  [98 F (36.7 C)-98.9 F (37.2 C)] 98.1 F (36.7 C) (03/11 1133) Pulse Rate:  [95-103] 100 (03/11 1133) Resp:  [17-18] 17 (03/11 1133) BP: (119-136)/(72-100) 122/72 (03/11 1133) SpO2:  [96 %-100 %] 99 % (03/11 1133) Weight:  [86.1 kg] 86.1 kg (03/11 0500)  Weight change: -0.6 kg Filed Weights   10/04/20 0444 10/05/20 0502 10/06/20 0500  Weight: 84.8 kg 86.7 kg 86.1 kg    Intake/Output:    Intake/Output Summary (Last 24 hours) at 10/06/2020 1353 Last data filed at 10/06/2020 0700 Gross per 24 hour  Intake 0 ml  Output 4750 ml  Net -4750 ml    Physical Exam: General: NAD, laying in bed  Head: Normocephalic, atraumatic. Dry oral mucosal membranes  Eyes: Anicteric  Neck: Supple, trachea midline  Lungs:  Clear to auscultation  Heart: Regular rate and rhythm  Abdomen:  Soft, nontender,   Extremities: no peripheral edema.  Neurologic: alert to self  Skin: No lesions    Basic Metabolic Panel:  Recent Labs  Lab 10/04/20 0627 10/05/20 0556 10/05/20 1007 10/05/20 1655 10/06/20 0237  NA 162* 163* 161* 161* 163*  K 3.6 3.0* 3.2* 3.2* 3.8  CL 129* 129* 128* 129* >130*  CO2 26 27 26 26 24   GLUCOSE 138* 187* 255* 136* 187*  BUN  12 11 12 14 12   CREATININE 0.73 0.87 0.89 0.95 0.85  CALCIUM 9.5 9.5 9.4 9.2 9.5     CBC: Recent Labs  Lab 10/03/20 1818 10/05/20 1007 10/06/20 0237  WBC 8.4 9.3 9.7  NEUTROABS 4.5  --   --   HGB 10.4* 10.3* 10.5*  HCT 35.7* 35.8* 36.8  MCV 89.9 90.9 91.8  PLT 498* 507* 528*     No results found for: HEPBSAG, HEPBSAB, HEPBIGM    Microbiology:  Recent Results (from the past 240 hour(s))  SARS CORONAVIRUS 2 (TAT 6-24 HRS) Nasopharyngeal Nasopharyngeal Swab     Status: None   Collection Time: 10/03/20  7:56 PM   Specimen: Nasopharyngeal Swab  Result Value Ref Range Status   SARS Coronavirus 2 NEGATIVE NEGATIVE Final    Comment: (NOTE) SARS-CoV-2 target nucleic acids are NOT DETECTED.  The SARS-CoV-2 RNA is generally detectable in upper and lower respiratory specimens during the acute phase of infection. Negative results do not preclude SARS-CoV-2 infection, do not rule out co-infections with other pathogens, and should not be used as the sole basis for treatment or other patient management decisions. Negative results must be combined with clinical observations, patient history, and epidemiological information. The expected result is Negative.  Fact Sheet for Patients: SugarRoll.be  Fact Sheet for Healthcare Providers: https://www.woods-mathews.com/  This test is not yet approved or cleared by  the [employer] and  has been authorized for detection and/or diagnosis of SARS-CoV-2 by FDA under an Emergency Use Authorization (EUA). This EUA will remain  in effect (meaning this test can be used) for the duration of the COVID-19 declaration under Se ction 564(b)(1) of the Act, 21 U.S.C. section 360bbb-3(b)(1), unless the authorization is terminated or revoked sooner.  Performed at [facility], [place] [address], [place], [place] [address]     Coagulation Studies: Recent Labs    10/05/20 1007  LABPROT 14.7  INR  1.2    Urinalysis: Recent Labs    10/04/20 0415  COLORURINE COLORLESS*  LABSPEC 1.006  PHURINE 6.0  GLUCOSEU NEGATIVE  HGBUR SMALL*  BILIRUBINUR NEGATIVE  KETONESUR NEGATIVE  PROTEINUR NEGATIVE  NITRITE NEGATIVE  LEUKOCYTESUR NEGATIVE      Imaging: DG Ankle Complete Right  Result Date: 10/05/2020 CLINICAL DATA:  Right ankle pain. EXAM: RIGHT ANKLE - COMPLETE 3+ VIEW COMPARISON:  No prior. FINDINGS: Diffuse soft tissue swelling. Severe diffuse degenerative changes. Corticated small bony densities noted about the right tibiotalar joint consistent with loose bodies. No evidence of acute fracture or dislocation. Dystrophic soft tissue calcifications noted. No radiopaque foreign body. IMPRESSION: Diffuse soft tissue swelling. Severe diffuse degenerative changes. Corticated small bony densities noted about the right tibiotalar joint consistent with loose bodies. No evidence of acute fracture or dislocation. Electronically Signed   By: Marcello Moores  Register   On: 10/05/2020 10:40   EEG adult  Result Date: 10/04/2020 Lora Havens, MD     10/04/2020  3:47 PM Patient Name: Cheyenne Anthony MRN: [id number] Epilepsy Attending: Lora Havens Referring Physician/Provider: Dr Rosalin Hawking Date: 10/04/2020 Duration: 38.25 mins Patient history: 75 y.o. 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 . EEG to evaluate for seizure Level of alertness:  lethargic AEDs during EEG study: LEV Technical aspects: This EEG study was done with scalp electrodes positioned according to the 10-20 International system of electrode placement. Electrical activity was acquired at a sampling rate of 500Hz  and reviewed with a high frequency filter of 70Hz  and a low frequency filter of 1Hz . EEG data were recorded continuously and digitally stored. Description: No posterior dominant rhythm was  seen. EEG showed continuous generalized 3 to 6 Hz theta-delta slowing.  Hyperventilation and photic stimulation were not performed.   Patient was also noted to have right>left head and neck twithcing per eeg technician ( not able to visualize on camera) during eeg. Concomitant eeg  before, during and after the event didn't show any eeg change to suggest seizure ABNORMALITY - Continuous slow, generalized IMPRESSION: This study is suggestive of moderate diffuse encephalopathy, nonspecific etiology. Patient was also noted to have right>left head and neck twitching without concomitant eeg change and therefore most likely not epileptic. However, focal motor seizure may not seen on scalp eeg . Therefore, clinical correlation is recommended. Dr Erlinda Hong ws notified. Lora Havens   ECHOCARDIOGRAM COMPLETE  Result Date: 10/04/2020    ECHOCARDIOGRAM REPORT   Patient Name:   NIURKA BENECKE Date of Exam: 10/04/2020 Medical Rec #:  [id number]   Height:       64.0 in Accession #:    [id number]  Weight:       187.0 lb Date of Birth:  [birth year]   BSA:          1.901 m Patient  Age:    25 years    BP:           111/86 mmHg Patient Gender: F           HR:           105 bpm. Exam Location:  [facility] Procedure: 2D Echo, Cardiac Doppler and Color Doppler Indications:     Stroke I63.9  History:         Patient has no prior history of Echocardiogram examinations.                  Risk Factors:Hypertension and Diabetes.  Sonographer:     Sherrie Sport RDCS (AE) Referring Phys:  [id number] Rosalin Hawking Diagnosing Phys: Kate Sable MD  Sonographer Comments: Suboptimal apical window. IMPRESSIONS  1. Left ventricular ejection fraction, by estimation, is 60 to 65%. The left ventricle has normal function. The left ventricle has no regional wall motion abnormalities. Left ventricular diastolic parameters are consistent with Grade I diastolic dysfunction (impaired relaxation).  2. Right ventricular systolic function is normal. The right ventricular size is  normal.  3. The mitral valve is normal in structure. No evidence of mitral valve regurgitation.  4. The aortic valve is tricuspid. Aortic valve regurgitation is not visualized. Mild to moderate aortic valve sclerosis/calcification is present, without any evidence of aortic stenosis.  5. The inferior vena cava is normal in size with greater than 50% respiratory variability, suggesting right atrial pressure of 3 mmHg. FINDINGS  Left Ventricle: Left ventricular ejection fraction, by estimation, is 60 to 65%. The left ventricle has normal function. The left ventricle has no regional wall motion abnormalities. The left ventricular internal cavity size was normal in size. There is  no left ventricular hypertrophy. Left ventricular diastolic parameters are consistent with Grade I diastolic dysfunction (impaired relaxation). Right Ventricle: The right ventricular size is normal. No increase in right ventricular wall thickness. Right ventricular systolic function is normal. Left Atrium: Left atrial size was normal in size. Right Atrium: Right atrial size was normal in size. Pericardium: There is no evidence of pericardial effusion. Mitral Valve: The mitral valve is normal in structure. No evidence of mitral valve regurgitation. Tricuspid Valve: The tricuspid valve is normal in structure. Tricuspid valve regurgitation is trivial. Aortic Valve: The aortic valve is tricuspid. Aortic valve regurgitation is not visualized. Mild to moderate aortic valve sclerosis/calcification is present, without any evidence of aortic stenosis. Aortic valve mean gradient measures 4.0 mmHg. Aortic valve peak gradient measures 6.1 mmHg. Aortic valve area, by VTI measures 4.20 cm. Pulmonic Valve: The pulmonic valve was normal in structure. Pulmonic valve regurgitation is not visualized. Aorta: The aortic root is normal in size and structure. Venous: The inferior vena cava is normal in size with greater than 50% respiratory variability, suggesting  right atrial pressure of 3 mmHg. IAS/Shunts: No atrial level shunt detected by color flow Doppler.  LEFT VENTRICLE PLAX 2D LVIDd:         3.45 cm  Diastology LVIDs:         2.04 cm  LV e' medial:    5.33 cm/s LV PW:         1.60 cm  LV E/e' medial:  9.2 LV IVS:        1.27 cm  LV e' lateral:   10.20 cm/s LVOT diam:     2.00 cm  LV E/e' lateral: 4.8 LV SV:         59 LV SV Index:  31 LVOT Area:     3.14 cm  RIGHT VENTRICLE RV Basal diam:  3.66 cm LEFT ATRIUM           Index       RIGHT ATRIUM           Index LA diam:      2.40 cm 1.26 cm/m  RA Area:     18.80 cm LA Vol (A2C): 22.0 ml 11.57 ml/m RA Volume:   49.20 ml  25.88 ml/m LA Vol (A4C): 41.1 ml 21.62 ml/m  AORTIC VALVE                   PULMONIC VALVE AV Area (Vmax):    2.94 cm    PV Vmax:        1.11 m/s AV Area (Vmean):   2.95 cm    PV Peak grad:   4.9 mmHg AV Area (VTI):     4.20 cm    RVOT Peak grad: 6 mmHg AV Vmax:           123.00 cm/s AV Vmean:          88.900 cm/s AV VTI:            0.140 m AV Peak Grad:      6.1 mmHg AV Mean Grad:      4.0 mmHg LVOT Vmax:         115.00 cm/s LVOT Vmean:        83.600 cm/s LVOT VTI:          0.187 m LVOT/AV VTI ratio: 1.34  AORTA Ao Root diam: 2.40 cm MITRAL VALVE               TRICUSPID VALVE MV Area (PHT): 7.29 cm    TR Peak grad:   26.0 mmHg MV Decel Time: 104 msec    TR Vmax:        255.00 cm/s MV E velocity: 49.30 cm/s MV A velocity: 81.40 cm/s  SHUNTS MV E/A ratio:  0.61        Systemic VTI:  0.19 m                            Systemic Diam: 2.00 cm Kate Sable MD Electronically signed by Kate Sable MD Signature Date/Time: 10/04/2020/4:54:49 PM    Final      Medications:   . dextrose 150 mL/hr at 10/06/20 0544  . levETIRAcetam 500 mg (10/06/20 1313)   .  stroke: mapping our early stages of recovery book   Does not apply Once  . apixaban  5 mg Oral BID  . aspirin EC  81 mg Oral Daily  . atorvastatin  20 mg Oral Daily  . desmopressin  2 spray Nasal BID  . diclofenac Sodium  4 g  Topical QID  . hydrochlorothiazide  25 mg Oral Daily  . insulin aspart  0-5 Units Subcutaneous QHS  . insulin aspart  0-9 Units Subcutaneous TID WC   acetaminophen **OR** acetaminophen (TYLENOL) oral liquid 160 mg/5 mL **OR** acetaminophen, LORazepam  Assessment/ Plan:  75 y.o. 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 Hypernatremia: with free water wasting and central diabetes insipidus. Calculated free water deficit of 6.4 liters Volume depleted on examination -Increased to 163 - increased DDAVP intranasaly 3mcg bid by primary team - Dextrose infusion - start hydrochlorothiazide 25mg  daily  2. Hypertension: blood pressure 145/75. Home regimen of lisinopril, hydrochlorothiazide, and metoprolol.  - BP controlled  3. Adrenal insufficiency:  - hydrocortisone  4. Hypokalemia: potassium chloride.  - current level 3.8     LOS: [place] 3/11/20221:53 PM  [facility] [place], [place]

## 2020-10-06 NOTE — Progress Notes (Signed)
PT Cancellation Note  Patient Details Name: Cheyenne Anthony MRN: [id number] DOB: [birth year]   Cancelled Treatment:    Reason Eval/Treat Not Completed: Medical issues which prohibited therapy.  Chart reviewed.  Pt's sodium continues to be significantly elevated (163) and chloride >130 today.  D/t this (per PT guidelines) will hold PT at this time and re-attempt PT evaluation at a later date/time as medically appropriate.  Leitha Bleak, PT 10/06/20, 11:28 AM

## 2020-10-06 NOTE — Care Management Important Message (Signed)
Important Message  Patient Details  Name: Cheyenne Anthony MRN: [id number] Date of Birth: 1946/01/05   Medicare Important Message Given:  Yes     Dannette Barbara 10/06/2020, 1:24 PM

## 2020-10-06 NOTE — Consult Note (Addendum)
[place] for heparin Indication: stroke and hx of DVT/PE.   No Known Allergies  Patient Measurements: Height: 5\' 4"  (162.6 cm) Weight: 86.7 kg (191 lb 2.2 oz) IBW/kg (Calculated) : 54.7 Heparin Dosing Weight: 74 kg  Vital Signs: Temp: 98 F (36.7 C) (03/10 1755) Temp Source: Oral (03/10 1726) BP: 129/90 (03/10 1755) Pulse Rate: 103 (03/10 1755)  Labs: Recent Labs    10/03/20 1818 10/03/20 2121 10/05/20 1007 10/05/20 1655 10/06/20 0237  HGB 10.4*  --  10.3*  --  10.5*  HCT 35.7*  --  35.8*  --  36.8  PLT 498*  --  507*  --  528*  APTT  --   --  41* 66* 72*  LABPROT  --   --  14.7  --   --   INR  --   --  1.2  --   --   HEPARINUNFRC  --   --  0.68  --  0.73*  CREATININE 0.84   < > 0.89 0.95 0.85   < > = values in this interval not displayed.    Estimated Creatinine Clearance: 62.8 mL/min (by C-G formula based on SCr of 0.85 mg/dL).   Medical History: Past Medical History:  Diagnosis Date  . Adrenal insufficiency, primary ([place]) 10/05/2020  . Brain mass 07/31/2020  . Cancer ([place])   . Hypertension   . Right lower lobe lung mass   . Type II diabetes mellitus with renal manifestations (HCC)     Medications:  Medications Prior to Admission  Medication Sig Dispense Refill Last Dose  . apixaban (ELIQUIS) 5 MG TABS tablet Take 5 mg by mouth every 12 (twelve) hours.   Unknown at Unknown  . atorvastatin (LIPITOR) 40 MG tablet Take 40 mg by mouth daily.   Unknown at Unknown  . DULoxetine (CYMBALTA) 60 MG capsule Take 60 mg by mouth daily.   Unknown at Unknown  . latanoprost (XALATAN) 0.005 % ophthalmic solution Place 1 drop into both eyes at bedtime.   Unknown at Unknown  . potassium chloride SA (KLOR-CON) 20 MEQ tablet Take 20 mEq by mouth daily.   Unknown at Unknown  . senna-docusate (SENOKOT-S) 8.6-50 MG tablet Take 2 tablets by mouth in the morning and at bedtime.   Unknown at Unknown  . sitaGLIPtin-metformin (JANUMET) 50-500  MG tablet Take 1 tablet by mouth in the morning and at bedtime.   Unknown at Unknown  . traMADol (ULTRAM) 50 MG tablet Take 50 mg by mouth 3 (three) times daily between meals as needed for pain.   Unknown at Unknown  . ACETAMINOPHEN EXTRA STRENGTH 500 MG tablet Take 1,000 mg by mouth every 8 (eight) hours. (Patient not taking: Reported on 10/04/2020)   Not Taking at Unknown  . hydrocortisone (CORTEF) 10 MG tablet Take 1 tablet (10 mg total) by mouth daily. With Breakfast (Patient not taking: Reported on 10/04/2020)   Not Taking at Unknown time  . hydrocortisone (CORTEF) 5 MG tablet Take 1 tablet (5 mg total) by mouth daily. At 2 PM (Patient not taking: Reported on 10/04/2020)   Not Taking at Unknown time  . lisinopril-hydrochlorothiazide (ZESTORETIC) 20-25 MG tablet Take 1 tablet by mouth daily. (Patient not taking: Reported on 10/04/2020)   Not Taking at Unknown time  . metoprolol succinate (TOPROL-XL) 50 MG 24 hr tablet Take 50 mg by mouth daily. Take with or immediately following a meal. (Patient not taking: Reported on 10/04/2020)   Not Taking  at Unknown time  . MIRALAX 17 g packet Take 17 g by mouth daily. (Patient not taking: Reported on 10/04/2020)   Not Taking at Unknown time  . NIFEdipine (ADALAT CC) 90 MG 24 hr tablet Take 90 mg by mouth daily. (Patient not taking: Reported on 10/04/2020)   Not Taking at Unknown time   Scheduled:  .  stroke: mapping our early stages of recovery book   Does not apply Once  . aspirin EC  81 mg Oral Daily  . atorvastatin  20 mg Oral Daily  . desmopressin  1 spray Nasal BID  . diclofenac Sodium  4 g Topical QID  . insulin aspart  0-5 Units Subcutaneous QHS  . insulin aspart  0-9 Units Subcutaneous TID WC   Infusions:  . dextrose 150 mL/hr at 10/05/20 2312  . heparin 950 Units/hr (10/05/20 1913)  . levETIRAcetam 500 mg (10/05/20 2318)   PRN: acetaminophen **OR** acetaminophen (TYLENOL) oral liquid 160 mg/5 mL **OR** acetaminophen, LORazepam Anti-infectives (From  admission, onward)   None      Assessment: Pharmacy was consulted to start heparin. Pt was on apixaban PTA for hx of VTE. Pt is admitted for concern for stroke. Will start heparin until pt is stabilized and plan to switch back to apixaban. Baseline HL 0.68, aPTT 41, and INR 1.2.   Goal of Therapy:  aPTT 66-85 seconds then switch to Heparin level 0.3 - 0.5 once heparin level and aPTT are correlating.  Monitor platelets by anticoagulation protocol: Yes   0310 @ 1655 aPTT 66, therapeutic @ 900 units/hr 0311 @ 0237 apTT 72, therapeutic, HL 0.73   Plan:  Will continue heparin infusion rate at 950 units/hr. Recheck aPTT level in 8 hours to confirm, given narrow goal range. Heparin and CBC with AM labs.  Continue to monitor H&H and platelets  Renda Rolls, PharmD, [facility] 10/06/2020 4:00 AM

## 2020-10-06 NOTE — Progress Notes (Signed)
OT Cancellation Note  Patient Details Name: Cheyenne Anthony MRN: [id number] DOB: 1946/04/18   Cancelled Treatment:    Reason Eval/Treat Not Completed: Medical issues which prohibited therapy. Pt with critically high sodium and chloride. Per therapy guidelines, OT intervention will be held today. OT will re-attempt when pt is medically appropriate.   Darleen Crocker, [place], OTR/L , CBIS ascom [phone]  10/06/20, 11:26 AM   10/06/2020, 11:25 AM

## 2020-10-06 NOTE — Progress Notes (Signed)
eeg done °

## 2020-10-06 NOTE — Progress Notes (Signed)
PROGRESS NOTE    Cheyenne Anthony  YOV:[id number] DOB: 1946-05-30 DOA: 10/03/2020 PCP: [place]   Brief Narrative: Taken from H&P. Cheyenne Anthony is a 75 y.o. 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  MRI with a small left precentral gyrus acute infarct. Concern of seizures with acute infarct and intracranial metastatic disease s/p craniotomy. Neurology was consulted, she was loaded with Keppra followed by start of Keppra twice daily.  EEG done-was negative for any epileptiform activity but focal motor seizures cannot be excluded.  Worsening hypernatremia, very diluted polyuria, labs consistent with diabetes insipidus. Started her on desmopressin and nephrology was consulted. Increasing the dose of desmopressin and HCTZ was added today due to persistent hypernatremia.  Subjective: Patient was seen and examined during morning rounds.  No new complaint.  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  Imaging positive for  acute precentral gyrus CVA.  That can it self trigger seizures, right sided twitching is concerning for focal seizure-like activity.  Neurology was consulted-they are loaded her with Keppra and she was started on twice daily dose of Keppra. History of recent craniotomy secondary to cerebellar mets at [facility] in January 2022. CVA work-up with A1c of 7.4, lipid panel with LDL of 22 and HDL of 40, echocardiogram with normal EF and grade 1 diastolic dysfunction.  No other significant abnormality. EEG was negative for any specific epileptiform waves but focal motor seizures cannot be excluded.  Clinically seems improving. -Completed CVA work-up. -Continue with Keppra -Continue to monitor for any new neurologic deficit -Patient will go back to SNF  Hypernatremia/polyuria/diabetes insipidus.  Worsening sodium at 163 today.  Appears dry.  Significant polyuria with very diluted urine.  Urine osmolality very less as compared to serum.  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 She was started on desmopressin. -Increased desmopressin to 20 mic twice daily. -HCTZ at 25 mg daily was added by nephrology. -Continue D5 -Monitor sodium -Nephrology was consulted-appreciate their help.  Hypertension.  Blood pressure within goal. -Her home antihypertensives which include metoprolol, Zestoretic and nifedipine were held initially for permissive hypertension. -Can be restarted if needed now.  Type 2 diabetes mellitus with renal manifestations.  Uncontrolled with A1c of 7.4 -Continue with SSI  History of adrenal crisis in 08/2020.  Repeat a.m. cortisol today was within normal limit. Patient completed the tapering dose of prednisone.  History of lung cancer, metastatic to cerebellum status post craniotomy -No acute issues -She will continue outpatient follow-up with oncology. -Patient is very high risk for deterioration and death. -Palliative care was consulted-had discussion with daughter and  she wants everything  needs to be done for her mother, tried explaining that approach might harm her more than benefit.  Patient will remain full code along with full scope of care at this time.  Right ankle pain.  Imaging was obtained which was negative for any acute fractures but did show some small bony densities, consistent with loose bodies. -Symptomatic management   Objective: Vitals:   10/06/20 0447 10/06/20 0500 10/06/20 0732 10/06/20 1133  BP: (!) 136/92  (!) 136/100 122/72  Pulse: 95  95 100  Resp: 18  18 17   Temp: 98.4 F (36.9 C)  98.9 F (37.2 C) 98.1 F (36.7 C)  TempSrc: Oral  Oral Oral  SpO2: 97%  100% 99%  Weight:  86.1 kg    Height:        Intake/Output Summary (Last 24 hours) at 10/06/2020 1507 Last data filed at 10/06/2020 0700 Gross per 24 hour  Intake 0 ml  Output 4750 ml  Net -4750 ml   Filed Weights   10/04/20 0444 10/05/20 0502 10/06/20 0500  Weight: 84.8 kg 86.7 kg 86.1 kg    Examination:  General.  Lethargic elderly lady, in no acute distress. Pulmonary.  Lungs clear bilaterally, normal respiratory effort. CV.  Regular rate and rhythm, no JVD, rub or murmur. Abdomen.  Soft, nontender, nondistended, BS positive. CNS.  Alert and oriented x3.  No focal neurologic deficit. Extremities.  No edema, no cyanosis, pulses intact and symmetrical. Psychiatry.  Judgment and insight appears normal.  DVT prophylaxis: Lovenox Code Status: Full Family Communication: Discussed with daughter on phone. Disposition Plan:  Status is: Inpatient  Remains inpatient appropriate because:Inpatient level of care appropriate due to severity of illness   Dispo: The patient is from: SNF              Anticipated d/c is to: SNF              Patient currently is not medically stable to d/c.   Difficult to place patient No              Level of care: Progressive Cardiac  All the records are reviewed and case discussed with Care Management/Social Worker. Management  plans discussed with the patient, nursing and they are in agreement.  Consultants:   Neurology  Nephrology  Palliative care  Procedures:  Antimicrobials:   Data Reviewed: I have personally reviewed following labs and imaging studies  CBC: Recent Labs  Lab 10/03/20 1818 10/05/20 1007 10/06/20 0237  WBC 8.4 9.3 9.7  NEUTROABS 4.5  --   --   HGB 10.4* 10.3* 10.5*  HCT 35.7* 35.8* 36.8  MCV 89.9 90.9 91.8  PLT 498* 507* 824*   Basic Metabolic Panel: Recent Labs  Lab 10/04/20 0627 10/05/20 0556 10/05/20 1007 10/05/20 1655 10/06/20 0237  NA 162* 163* 161* 161* 163*  K 3.6 3.0* 3.2* 3.2* 3.8  CL 129* 129* 128* 129* >130*  CO2 26 27 26 26 24   GLUCOSE 138* 187* 255* 136* 187*  BUN 12 11 12 14 12   CREATININE 0.73 0.87 0.89 0.95 0.85  CALCIUM 9.5 9.5 9.4 9.2 9.5   GFR: Estimated Creatinine Clearance: 62.6 mL/min (by C-G formula based on SCr of 0.85 mg/dL). Liver Function Tests: No results for input(s): AST, ALT, ALKPHOS, BILITOT, PROT, ALBUMIN in the last 168 hours. No results for input(s): LIPASE, AMYLASE in the last 168 hours. No results for input(s): AMMONIA in the last 168 hours. Coagulation Profile: Recent Labs  Lab 10/05/20 1007  INR 1.2   Cardiac Enzymes: No results for input(s): CKTOTAL, CKMB, CKMBINDEX, TROPONINI in the last 168 hours. BNP (last 3 results) No results for input(s): PROBNP in the last 8760 hours. HbA1C: Recent Labs    10/04/20 0627  HGBA1C 7.4*   CBG: Recent Labs  Lab 10/05/20 1727 10/05/20 2104 10/06/20 0748 10/06/20 1151 10/06/20 1155  GLUCAP 133* 188* 191* 236* 264*   Lipid Profile: Recent Labs    10/04/20 0627  CHOL 89  HDL 40*  LDLCALC 22  TRIG 134  CHOLHDL 2.2   Thyroid Function Tests: No results for input(s): TSH, T4TOTAL, FREET4, T3FREE, THYROIDAB in the last 72 hours. Anemia Panel: No results for input(s): VITAMINB12, FOLATE, FERRITIN, TIBC, IRON, RETICCTPCT in the last 72 hours. Sepsis Labs: No results  for input(s): PROCALCITON, LATICACIDVEN in the last 168 hours.  Recent Results (from the past 240 hour(s))  SARS CORONAVIRUS 2 (TAT 6-24 HRS) Nasopharyngeal Nasopharyngeal Swab     Status: None   Collection Time: 10/03/20  7:56 PM   Specimen: Nasopharyngeal Swab  Result Value Ref Range Status   SARS Coronavirus 2 NEGATIVE NEGATIVE Final    Comment: (NOTE) SARS-CoV-2 target nucleic acids are NOT DETECTED.  The SARS-CoV-2 RNA is generally detectable in upper and lower respiratory specimens during the acute phase of infection. Negative results do not preclude SARS-CoV-2 infection, do not rule out co-infections with other pathogens, and should not be used as the sole basis for treatment or other patient management decisions. Negative results must be combined with clinical observations, patient history, and epidemiological information. The expected result is Negative.  Fact Sheet for Patients: SugarRoll.be  Fact Sheet for Healthcare Providers: https://www.woods-mathews.com/  This test is not yet approved or cleared by the [place] FDA and  has been authorized for detection and/or diagnosis of SARS-CoV-2 by FDA under an Emergency Use Authorization (EUA). This EUA will remain  in effect (meaning this test can be used) for the duration of the COVID-19 declaration under Se ction 564(b)(1) of the Act, 21 U.S.C. section 360bbb-3(b)(1), unless the authorization is terminated or revoked sooner.  Performed at [facility], [place] [address], [place], [place] [address]      Radiology Studies: DG Ankle Complete Right  Result Date: 10/05/2020 CLINICAL DATA:  Right ankle pain. EXAM: RIGHT ANKLE - COMPLETE 3+ VIEW COMPARISON:  No prior. FINDINGS: Diffuse soft tissue swelling. Severe diffuse degenerative changes. Corticated small bony densities noted about the right tibiotalar joint consistent with loose bodies. No evidence of acute fracture or  dislocation. Dystrophic soft tissue calcifications noted. No radiopaque foreign body. IMPRESSION: Diffuse soft tissue swelling. Severe diffuse degenerative changes. Corticated small bony densities noted about the right tibiotalar joint consistent with loose bodies. No evidence of acute fracture or dislocation. Electronically Signed   By: Marcello Moores  Register   On: 10/05/2020 10:40   EEG adult  Result Date: 10/04/2020 Lora Havens, MD     10/04/2020  3:47 PM Patient Name: Palak Tercero MRN: [id number] Epilepsy Attending: Lora Havens Referring Physician/Provider: Dr Rosalin Hawking Date: 10/04/2020 Duration: 38.25 mins Patient history: 75 y.o. 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 . EEG to evaluate for seizure Level of alertness:  lethargic AEDs during EEG study: LEV Technical aspects: This EEG study was done with scalp electrodes positioned according to the 10-20 International system of electrode placement.  Electrical activity was acquired at a sampling rate of 500Hz  and reviewed with a high frequency filter of 70Hz  and a low frequency filter of 1Hz . EEG data were recorded continuously and digitally stored. Description: No posterior dominant rhythm was seen. EEG showed continuous generalized 3 to 6 Hz theta-delta slowing.  Hyperventilation and photic stimulation were not performed.   Patient was also noted to have right>left head and neck twithcing per eeg technician ( not able to visualize on camera) during eeg. Concomitant eeg  before, during and after the event didn't show any eeg change to suggest seizure ABNORMALITY - Continuous slow, generalized IMPRESSION: This study is suggestive of moderate diffuse encephalopathy, nonspecific etiology. Patient was also noted to have right>left head and neck twitching without concomitant eeg change and therefore most likely not  epileptic. However, focal motor seizure may not seen on scalp eeg . Therefore, clinical correlation is recommended. Dr Erlinda Hong ws notified. Lora Havens   ECHOCARDIOGRAM COMPLETE  Result Date: 10/04/2020    ECHOCARDIOGRAM REPORT   Patient Name:   LASHAWNDRA LAMPKINS Date of Exam: 10/04/2020 Medical Rec #:  [id number]   Height:       64.0 in Accession #:    [id number]  Weight:       187.0 lb Date of Birth:  [birth year]   BSA:          1.901 m Patient Age:    38 years    BP:           111/86 mmHg Patient Gender: F           HR:           105 bpm. Exam Location:  [facility] Procedure: 2D Echo, Cardiac Doppler and Color Doppler Indications:     Stroke I63.9  History:         Patient has no prior history of Echocardiogram examinations.                  Risk Factors:Hypertension and Diabetes.  Sonographer:     Sherrie Sport RDCS (AE) Referring Phys:  [id number] Rosalin Hawking Diagnosing Phys: Kate Sable MD  Sonographer Comments: Suboptimal apical window. IMPRESSIONS  1. Left ventricular ejection fraction, by estimation, is 60 to 65%. The left ventricle has normal function. The left ventricle has no regional wall motion abnormalities. Left ventricular diastolic parameters are consistent with Grade I diastolic dysfunction (impaired relaxation).  2. Right ventricular systolic function is normal. The right ventricular size is normal.  3. The mitral valve is normal in structure. No evidence of mitral valve regurgitation.  4. The aortic valve is tricuspid. Aortic valve regurgitation is not visualized. Mild to moderate aortic valve sclerosis/calcification is present, without any evidence of aortic stenosis.  5. The inferior vena cava is normal in size with greater than 50% respiratory variability, suggesting right atrial pressure of 3 mmHg. FINDINGS  Left Ventricle: Left ventricular ejection fraction, by estimation, is 60 to 65%. The left ventricle has normal function. The left ventricle has no regional wall motion abnormalities. The left  ventricular internal cavity size was normal in size. There is  no left ventricular hypertrophy. Left ventricular diastolic parameters are consistent with Grade I diastolic dysfunction (impaired relaxation). Right Ventricle: The right ventricular size is normal. No increase in right ventricular wall thickness. Right ventricular systolic function is normal. Left Atrium: Left atrial size was normal in size. Right Atrium: Right atrial size was normal in size. Pericardium: There is no evidence of pericardial effusion. Mitral  Valve: The mitral valve is normal in structure. No evidence of mitral valve regurgitation. Tricuspid Valve: The tricuspid valve is normal in structure. Tricuspid valve regurgitation is trivial. Aortic Valve: The aortic valve is tricuspid. Aortic valve regurgitation is not visualized. Mild to moderate aortic valve sclerosis/calcification is present, without any evidence of aortic stenosis. Aortic valve mean gradient measures 4.0 mmHg. Aortic valve peak gradient measures 6.1 mmHg. Aortic valve area, by VTI measures 4.20 cm. Pulmonic Valve: The pulmonic valve was normal in structure. Pulmonic valve regurgitation is not visualized. Aorta: The aortic root is normal in size and structure. Venous: The inferior vena cava is normal in size with greater than 50% respiratory variability, suggesting right atrial pressure of 3 mmHg. IAS/Shunts: No atrial level shunt detected by color flow Doppler.  LEFT VENTRICLE PLAX 2D LVIDd:         3.45 cm  Diastology LVIDs:         2.04 cm  LV e' medial:    5.33 cm/s LV PW:         1.60 cm  LV E/e' medial:  9.2 LV IVS:        1.27 cm  LV e' lateral:   10.20 cm/s LVOT diam:     2.00 cm  LV E/e' lateral: 4.8 LV SV:         59 LV SV Index:   31 LVOT Area:     3.14 cm  RIGHT VENTRICLE RV Basal diam:  3.66 cm LEFT ATRIUM           Index       RIGHT ATRIUM           Index LA diam:      2.40 cm 1.26 cm/m  RA Area:     18.80 cm LA Vol (A2C): 22.0 ml 11.57 ml/m RA Volume:    49.20 ml  25.88 ml/m LA Vol (A4C): 41.1 ml 21.62 ml/m  AORTIC VALVE                   PULMONIC VALVE AV Area (Vmax):    2.94 cm    PV Vmax:        1.11 m/s AV Area (Vmean):   2.95 cm    PV Peak grad:   4.9 mmHg AV Area (VTI):     4.20 cm    RVOT Peak grad: 6 mmHg AV Vmax:           123.00 cm/s AV Vmean:          88.900 cm/s AV VTI:            0.140 m AV Peak Grad:      6.1 mmHg AV Mean Grad:      4.0 mmHg LVOT Vmax:         115.00 cm/s LVOT Vmean:        83.600 cm/s LVOT VTI:          0.187 m LVOT/AV VTI ratio: 1.34  AORTA Ao Root diam: 2.40 cm MITRAL VALVE               TRICUSPID VALVE MV Area (PHT): 7.29 cm    TR Peak grad:   26.0 mmHg MV Decel Time: 104 msec    TR Vmax:        255.00 cm/s MV E velocity: 49.30 cm/s MV A velocity: 81.40 cm/s  SHUNTS MV E/A ratio:  0.61        Systemic VTI:  0.19  m                            Systemic Diam: 2.00 cm Kate Sable MD Electronically signed by Kate Sable MD Signature Date/Time: 10/04/2020/4:54:49 PM    Final     Scheduled Meds: .  stroke: mapping our early stages of recovery book   Does not apply Once  . apixaban  5 mg Oral BID  . aspirin EC  81 mg Oral Daily  . atorvastatin  20 mg Oral Daily  . desmopressin  2 spray Nasal BID  . diclofenac Sodium  4 g Topical QID  . hydrochlorothiazide  25 mg Oral Daily  . insulin aspart  0-5 Units Subcutaneous QHS  . insulin aspart  0-9 Units Subcutaneous TID WC   Continuous Infusions: . dextrose 150 mL/hr at 10/06/20 0544  . levETIRAcetam 500 mg (10/06/20 1313)     LOS: 2 days   Time spent: 30 minutes. More than 50% of the time was spent in counseling/coordination of care  Lorella Nimrod, MD [facility]  If 7PM-7AM, please contact night-coverage [email or web address]  10/06/2020, 3:07 PM   This record has been created using Systems analyst. Errors have been sought and corrected,but may not always be located. Such creation errors do not reflect on the standard of care.

## 2020-10-06 NOTE — Progress Notes (Signed)
STROKE TEAM PROGRESS NOTE   SUBJECTIVE (INTERVAL HISTORY) No family is at the bedside. Pt continues to improve from neuro standpoint. She is sitting up in bed for breakfast, she is feeing her self icecream. She still has mild asterixis b/l hands and arms. She is orientated to place and age today, no facial or arm twitching, following all simple commands. Still has BLE pain on movement. Still has diluted urine, and hypernatremia. On DDAVP nasal spray. EEG repeat still pending   OBJECTIVE Temp:  [98 F (36.7 C)-98.9 F (37.2 C)] 98.9 F (37.2 C) (03/11 0732) Pulse Rate:  [91-103] 95 (03/11 0732) Cardiac Rhythm: Sinus tachycardia (03/10 1900) Resp:  [17-18] 18 (03/11 0732) BP: (119-136)/(77-100) 136/100 (03/11 0732) SpO2:  [96 %-100 %] 100 % (03/11 0732) Weight:  [86.1 kg] 86.1 kg (03/11 0500)  Recent Labs  Lab 10/04/20 1538 10/05/20 1222 10/05/20 1727 10/05/20 2104 10/06/20 0748  GLUCAP 173* 230* 133* 188* 191*   Recent Labs  Lab 10/04/20 0627 10/05/20 0556 10/05/20 1007 10/05/20 1655 10/06/20 0237  NA 162* 163* 161* 161* 163*  K 3.6 3.0* 3.2* 3.2* 3.8  CL 129* 129* 128* 129* >130*  CO2 26 27 26 26 24   GLUCOSE 138* 187* 255* 136* 187*  BUN 12 11 12 14 12   CREATININE 0.73 0.87 0.89 0.95 0.85  CALCIUM 9.5 9.5 9.4 9.2 9.5   No results for input(s): AST, ALT, ALKPHOS, BILITOT, PROT, ALBUMIN in the last 168 hours. Recent Labs  Lab 10/03/20 1818 10/05/20 1007 10/06/20 0237  WBC 8.4 9.3 9.7  NEUTROABS 4.5  --   --   HGB 10.4* 10.3* 10.5*  HCT 35.7* 35.8* 36.8  MCV 89.9 90.9 91.8  PLT 498* 507* 528*   No results for input(s): CKTOTAL, CKMB, CKMBINDEX, TROPONINI in the last 168 hours. Recent Labs    10/05/20 1007  LABPROT 14.7  INR 1.2   Recent Labs    10/04/20 0415  COLORURINE COLORLESS*  LABSPEC 1.006  PHURINE 6.0  GLUCOSEU NEGATIVE  HGBUR SMALL*  BILIRUBINUR NEGATIVE  KETONESUR NEGATIVE  PROTEINUR NEGATIVE  NITRITE NEGATIVE  LEUKOCYTESUR NEGATIVE        Component Value Date/Time   CHOL 89 10/04/2020 0627   TRIG 134 10/04/2020 0627   HDL 40 (L) 10/04/2020 0627   CHOLHDL 2.2 10/04/2020 0627   VLDL 27 10/04/2020 0627   LDLCALC 22 10/04/2020 0627   Lab Results  Component Value Date   HGBA1C 7.4 (H) 10/04/2020   No results found for: LABOPIA, COCAINSCRNUR, LABBENZ, AMPHETMU, THCU, LABBARB  No results for input(s): ETH in the last 168 hours.  I have personally reviewed the radiological images below and agree with the radiology interpretations.  CT [facility] HEAD NECK W WO CONTRAST  Result Date: 10/03/2020 CLINICAL DATA:  Stroke follow-up. Right arm shaking. EXAM: CT ANGIOGRAPHY HEAD AND NECK TECHNIQUE: Multidetector CT imaging of the head and neck was performed using the standard protocol during bolus administration of intravenous contrast. Multiplanar CT image reconstructions and MIPs were obtained to evaluate the vascular anatomy. Carotid stenosis measurements (when applicable) are obtained utilizing NASCET criteria, using the distal internal carotid diameter as the denominator. CONTRAST:  59mL OMNIPAQUE IOHEXOL 350 MG/ML SOLN COMPARISON:  None. FINDINGS: CTA NECK FINDINGS SKELETON: There is no bony spinal canal stenosis. No lytic or blastic lesion. OTHER NECK: Normal pharynx, larynx and major salivary glands. No cervical lymphadenopathy. Unremarkable thyroid gland. UPPER CHEST: No pneumothorax or pleural effusion. No nodules or masses. AORTIC ARCH: There is calcific  atherosclerosis of the aortic arch. There is no aneurysm, dissection or hemodynamically significant stenosis of the visualized portion of the aorta. Conventional 3 vessel aortic branching pattern. The visualized proximal subclavian arteries are widely patent. RIGHT CAROTID SYSTEM: No dissection, occlusion or aneurysm. There is predominantly calcified atherosclerosis extending into the proximal ICA, resulting in approximately 70% stenosis. LEFT CAROTID SYSTEM: No dissection,  occlusion or aneurysm. There is calcific atherosclerosis extending into the proximal ICA, resulting in less than 50% stenosis. VERTEBRAL ARTERIES: Right dominant configuration. Both origins are clearly patent. There is no dissection, occlusion or flow-limiting stenosis to the skull base (V1-V3 segments). CTA HEAD FINDINGS POSTERIOR CIRCULATION: --Vertebral arteries: Normal V4 segments. --Inferior cerebellar arteries: Normal. --Basilar artery: Normal. --Superior cerebellar arteries: Normal. --Posterior cerebral arteries (PCA): Normal. ANTERIOR CIRCULATION: --Intracranial internal carotid arteries: Atherosclerotic calcification of the internal carotid arteries at the skull base with mild-to-moderate stenosis worse at the distal right cavernous segment. --Anterior cerebral arteries (ACA): Normal. Both A1 segments are present. Patent anterior communicating artery (a-comm). --Middle cerebral arteries (MCA): Normal. VENOUS SINUSES: As permitted by contrast timing, patent. ANATOMIC VARIANTS: None Review of the MIP images confirms the above findings. IMPRESSION: 1. No intracranial arterial occlusion or high-grade stenosis. 2. 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 Aortic Atherosclerosis (ICD10-I70.0). Electronically Signed   By: Ulyses Jarred M.D.   On: 10/03/2020 23:49   DG Ankle Complete Right  Result Date: 10/05/2020 CLINICAL DATA:  Right ankle pain. EXAM: RIGHT ANKLE - COMPLETE 3+ VIEW COMPARISON:  No prior. FINDINGS: Diffuse soft tissue swelling. Severe diffuse degenerative changes. Corticated small bony densities noted about the right tibiotalar joint consistent with loose bodies. No evidence of acute fracture or dislocation. Dystrophic soft tissue calcifications noted. No radiopaque foreign body. IMPRESSION: Diffuse soft tissue swelling. Severe diffuse degenerative changes. Corticated small bony densities noted about the  right tibiotalar joint consistent with loose bodies. No evidence of acute fracture or dislocation. Electronically Signed   By: Marcello Moores  Register   On: 10/05/2020 10:40   CT Head Wo Contrast  Result Date: 10/03/2020 CLINICAL DATA:  Shaking, history of brain tumor EXAM: CT HEAD WITHOUT CONTRAST TECHNIQUE: Contiguous axial images were obtained from the base of the skull through the vertex without intravenous contrast. COMPARISON:  08/19/2020 FINDINGS: Brain: Chronic right MCA territory infarction involving the frontal lobe and insula. There is gyriform hyperdensity in the right occipital lobe. Hypoattenuation likely reflecting surgical cavity is present in the right cerebellum underlying craniectomy and cranioplasty without mass effect. Limited evaluation for tumor on this study. Prominence of the ventricles and sulci reflects generalized parenchymal volume loss. Additional patchy hypoattenuation in the supratentorial white matter is nonspecific but probably reflects stable chronic microvascular ischemic changes. Vascular: No hyperdense vessel.There is atherosclerotic calcification at the skull base. Skull: Right lateral suboccipital craniectomy with cranioplasty. Small right frontal burr hole. Sinuses/Orbits: No acute finding. Other: None. IMPRESSION: Gyriform hyperdensity in the right occipital lobe likely reflecting interval, now subacute infarction with laminar necrosis or petechial hemorrhage. Postoperative changes in the posterior fossa. 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 changes. Electronically Signed   By: Macy Mis M.D.   On: 10/03/2020 19:03   MR BRAIN WO CONTRAST  Result Date: 10/03/2020 CLINICAL DATA:  Tremor EXAM: MRI HEAD WITHOUT CONTRAST TECHNIQUE: Multiplanar, multiecho pulse sequences of the brain and surrounding structures were obtained without intravenous contrast. COMPARISON:  08/20/2020 FINDINGS: Brain: There is a small acute infarct within the left  frontal lobe along the left precentral gyrus. There are old infarcts of the right frontal operculum and right occipital lobe. There are postsurgical changes of right cerebellum. Chronic blood products at the right cerebellum There is multifocal hyperintense T2-weighted signal within the white matter. Generalized volume loss without a clear lobar predilection. The midline structures are normal. Vascular: Major flow voids are preserved. Skull and upper cervical spine: Right retromastoid cranioplasty. Sinuses/Orbits:No paranasal sinus fluid levels or advanced mucosal thickening. No mastoid or middle ear effusion. Normal orbits. IMPRESSION: 1. Small acute infarct of the left precentral gyrus. No hemorrhage or mass effect. 2. Old infarcts of the right frontal operculum and right occipital lobe. Electronically Signed   By: Ulyses Jarred M.D.   On: 10/03/2020 21:43   US Venous Img Lower Bilateral (DVT)  Result Date: 10/04/2020 CLINICAL DATA:  History of DVT EXAM: BILATERAL LOWER EXTREMITY VENOUS DOPPLER ULTRASOUND TECHNIQUE: Gray-scale sonography with graded compression, as well as color Doppler and duplex ultrasound were performed to evaluate the lower extremity deep venous systems from the level of the common femoral vein and including the common femoral, femoral, profunda femoral, popliteal and calf veins including the posterior tibial, peroneal and gastrocnemius veins when visible. The superficial great saphenous vein was also interrogated. Spectral Doppler was utilized to evaluate flow at rest and with distal augmentation maneuvers in the common femoral, femoral and popliteal veins. COMPARISON:  None. FINDINGS: RIGHT LOWER EXTREMITY Common Femoral Vein: No evidence of thrombus. Normal compressibility, respiratory phasicity and response to augmentation. Saphenofemoral Junction: No evidence of thrombus. Normal compressibility and flow on color Doppler imaging. Profunda Femoral Vein: No evidence of thrombus. Normal  compressibility and flow on color Doppler imaging. Femoral Vein: No evidence of thrombus. Normal compressibility, respiratory phasicity and response to augmentation. Popliteal Vein: No evidence of thrombus. Normal compressibility, respiratory phasicity and response to augmentation. Calf Veins: No evidence of thrombus. Normal compressibility and flow on color Doppler imaging. Superficial Great Saphenous Vein: No evidence of thrombus. Normal compressibility. Venous Reflux:  None. Other Findings:  None. LEFT LOWER EXTREMITY Common Femoral Vein: No evidence of thrombus. Normal compressibility, respiratory phasicity and response to augmentation. Saphenofemoral Junction: No evidence of thrombus. Normal compressibility and flow on color Doppler imaging. Profunda Femoral Vein: No evidence of thrombus. Normal compressibility and flow on color Doppler imaging. Femoral Vein: No evidence of thrombus. Normal compressibility, respiratory phasicity and response to augmentation. Popliteal Vein: No evidence of thrombus. Normal compressibility, respiratory phasicity and response to augmentation. Calf Veins: No evidence of thrombus. Normal compressibility and flow on color Doppler imaging. Superficial Great Saphenous Vein: No evidence of thrombus. Normal compressibility. Venous Reflux:  None. Other Findings:  None. IMPRESSION: No evidence of deep venous thrombosis in either lower extremity. Electronically Signed   By: Inez Catalina M.D.   On: 10/04/2020 00:29   EEG adult  Result Date: 10/04/2020 Lora Havens, MD     10/04/2020  3:47 PM Patient Name: Avagail Whittlesey MRN: [id number] Epilepsy Attending: Lora Havens Referring Physician/Provider: Dr Rosalin Hawking Date: 10/04/2020 Duration: 38.25 mins Patient history: 75 y.o. 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 . EEG to evaluate for  seizure Level of alertness:  lethargic AEDs during EEG study: LEV Technical aspects: This EEG study was done with scalp electrodes positioned according to the 10-20 International system of electrode placement. Electrical activity was acquired at a  sampling rate of 500Hz  and reviewed with a high frequency filter of 70Hz  and a low frequency filter of 1Hz . EEG data were recorded continuously and digitally stored. Description: No posterior dominant rhythm was seen. EEG showed continuous generalized 3 to 6 Hz theta-delta slowing.  Hyperventilation and photic stimulation were not performed.   Patient was also noted to have right>left head and neck twithcing per eeg technician ( not able to visualize on camera) during eeg. Concomitant eeg  before, during and after the event didn't show any eeg change to suggest seizure ABNORMALITY - Continuous slow, generalized IMPRESSION: This study is suggestive of moderate diffuse encephalopathy, nonspecific etiology. Patient was also noted to have right>left head and neck twitching without concomitant eeg change and therefore most likely not epileptic. However, focal motor seizure may not seen on scalp eeg . Therefore, clinical correlation is recommended. Dr Erlinda Hong ws notified. Lora Havens   ECHOCARDIOGRAM COMPLETE  Result Date: 10/04/2020    ECHOCARDIOGRAM REPORT   Patient Name:   CHALLIS CRILL Date of Exam: 10/04/2020 Medical Rec #:  [id number]   Height:       64.0 in Accession #:    [id number]  Weight:       187.0 lb Date of Birth:  Feb 10, 1946   BSA:          1.901 m Patient Age:    47 years    BP:           111/86 mmHg Patient Gender: F           HR:           105 bpm. Exam Location:  [facility] Procedure: 2D Echo, Cardiac Doppler and Color Doppler Indications:     Stroke I63.9  History:         Patient has no prior history of Echocardiogram examinations.                  Risk Factors:Hypertension and Diabetes.  Sonographer:     Sherrie Sport RDCS (AE) Referring Phys:  [id number] Rosalin Hawking  Diagnosing Phys: Kate Sable MD  Sonographer Comments: Suboptimal apical window. IMPRESSIONS  1. Left ventricular ejection fraction, by estimation, is 60 to 65%. The left ventricle has normal function. The left ventricle has no regional wall motion abnormalities. Left ventricular diastolic parameters are consistent with Grade I diastolic dysfunction (impaired relaxation).  2. Right ventricular systolic function is normal. The right ventricular size is normal.  3. The mitral valve is normal in structure. No evidence of mitral valve regurgitation.  4. The aortic valve is tricuspid. Aortic valve regurgitation is not visualized. Mild to moderate aortic valve sclerosis/calcification is present, without any evidence of aortic stenosis.  5. The inferior vena cava is normal in size with greater than 50% respiratory variability, suggesting right atrial pressure of 3 mmHg. FINDINGS  Left Ventricle: Left ventricular ejection fraction, by estimation, is 60 to 65%. The left ventricle has normal function. The left ventricle has no regional wall motion abnormalities. The left ventricular internal cavity size was normal in size. There is  no left ventricular hypertrophy. Left ventricular diastolic parameters are consistent with Grade I diastolic dysfunction (impaired relaxation). Right Ventricle: The right ventricular size is normal. No increase in right ventricular wall thickness. Right ventricular systolic function is normal. Left Atrium: Left atrial size was normal in size. Right Atrium: Right atrial size was normal in size. Pericardium: There is no evidence of pericardial effusion. Mitral Valve: The mitral valve is normal in  structure. No evidence of mitral valve regurgitation. Tricuspid Valve: The tricuspid valve is normal in structure. Tricuspid valve regurgitation is trivial. Aortic Valve: The aortic valve is tricuspid. Aortic valve regurgitation is not visualized. Mild to moderate aortic valve sclerosis/calcification  is present, without any evidence of aortic stenosis. Aortic valve mean gradient measures 4.0 mmHg. Aortic valve peak gradient measures 6.1 mmHg. Aortic valve area, by VTI measures 4.20 cm. Pulmonic Valve: The pulmonic valve was normal in structure. Pulmonic valve regurgitation is not visualized. Aorta: The aortic root is normal in size and structure. Venous: The inferior vena cava is normal in size with greater than 50% respiratory variability, suggesting right atrial pressure of 3 mmHg. IAS/Shunts: No atrial level shunt detected by color flow Doppler.  LEFT VENTRICLE PLAX 2D LVIDd:         3.45 cm  Diastology LVIDs:         2.04 cm  LV e' medial:    5.33 cm/s LV PW:         1.60 cm  LV E/e' medial:  9.2 LV IVS:        1.27 cm  LV e' lateral:   10.20 cm/s LVOT diam:     2.00 cm  LV E/e' lateral: 4.8 LV SV:         59 LV SV Index:   31 LVOT Area:     3.14 cm  RIGHT VENTRICLE RV Basal diam:  3.66 cm LEFT ATRIUM           Index       RIGHT ATRIUM           Index LA diam:      2.40 cm 1.26 cm/m  RA Area:     18.80 cm LA Vol (A2C): 22.0 ml 11.57 ml/m RA Volume:   49.20 ml  25.88 ml/m LA Vol (A4C): 41.1 ml 21.62 ml/m  AORTIC VALVE                   PULMONIC VALVE AV Area (Vmax):    2.94 cm    PV Vmax:        1.11 m/s AV Area (Vmean):   2.95 cm    PV Peak grad:   4.9 mmHg AV Area (VTI):     4.20 cm    RVOT Peak grad: 6 mmHg AV Vmax:           123.00 cm/s AV Vmean:          88.900 cm/s AV VTI:            0.140 m AV Peak Grad:      6.1 mmHg AV Mean Grad:      4.0 mmHg LVOT Vmax:         115.00 cm/s LVOT Vmean:        83.600 cm/s LVOT VTI:          0.187 m LVOT/AV VTI ratio: 1.34  AORTA Ao Root diam: 2.40 cm MITRAL VALVE               TRICUSPID VALVE MV Area (PHT): 7.29 cm    TR Peak grad:   26.0 mmHg MV Decel Time: 104 msec    TR Vmax:        255.00 cm/s MV E velocity: 49.30 cm/s MV A velocity: 81.40 cm/s  SHUNTS MV E/A ratio:  0.61        Systemic VTI:  0.19 m  Systemic Diam: 2.00 cm  Kate Sable MD Electronically signed by Kate Sable MD Signature Date/Time: 10/04/2020/4:54:49 PM    Final      PHYSICAL EXAM  Temp:  [98 F (36.7 C)-98.9 F (37.2 C)] 98.9 F (37.2 C) (03/11 0732) Pulse Rate:  [91-103] 95 (03/11 0732) Resp:  [17-18] 18 (03/11 0732) BP: (119-136)/(77-100) 136/100 (03/11 0732) SpO2:  [96 %-100 %] 100 % (03/11 0732) Weight:  [86.1 kg] 86.1 kg (03/11 0500)  General - well nourished, well developed, in no apparent distress.    Ophthalmologic - fundi not visualized due to noncooperation.    Cardiovascular - regular rhythm and rate  Neuro - awake alert, feeding her self breakfast, orientated to place and age, not orientated to time. Speech much improved, no significant dysarthria, able to name and repeat, able to follow all simple commands. Blinking to visual threat bilaterally, no gaze palsy, no more facial twitching, right nasolabial fold mildly flattening, tongue midline. Bilateral upper extremity 4/5, no drift, mild asterixis bilaterally today.  Bilateral lower extremity significant pain on movement. RLE significant pain at popliteal area, 0/5 proximally, LLE knee flexion 2/5 and bilateral 2-3/5 toe movement. Sensation light touch subjectively symmetrical. FTN grossly intact bilaterally, gait not tested.    ASSESSMENT/PLAN Ms. Notnamed Croucher is a 75 y.o. 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   Diabetics insipidus   Na 144->151->153->162->163->161->163  Diluted urine with large amount outpt  Urine osmo 113 and blood osmo 342, urine Na 39  On D5 @ 150cc  On DDAVP nasal spray  Likely due to CNS etiology - cerebellar metastasis s/p resection and stroke  Nephrology on board  Encephalopathy, much improved   Disorientated with b/l asterixis - much improved today   Facial twitching,  R>L  3/9 EEG no obvious seizure, but focal seizure sometimes difficult to see from surface EEG - on keppra  3/11 EEG moderate diffuse encephalopathy, no seizure  Likely due to DI, stroke, underlying medical conditions  Will continue keppra at this time   Treat underlying condition per primary team  Stroke:  left precentral gyrus infarct, embolic secondary to hypercoagulable state with advance cancer vs. Severe extra- and intracranial stenosis  CT head showed old right MCA stroke but also subacute to chronic right occipital infarct with laminar necrosis which was new from previous neuro imaging.    CTA head and neck showed significant b/l ICA bulb and siphon atherosclerosis with stenosis, R>L with 70% right ICA bulb stenosis.   MRI showed small left precentral gyrus infarct.   2D Echo EF 60-65%  DVT negative for DVT   LDL 22   A1C 7.4    lovenox for VTE prophylaxis  Eliquis (apixaban) daily prior to admission, now on eliquis and ASA 81 severe extra- and intracranial stenosis.   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  2/17 admitted to [facility], found to have bilateral PE RV strain.  DVT showed positive left popliteal near occlusive DVT.  Discharged on eliquis  On Eliquis PTA  Was on heparin IV.  Now transition back to eliquis    Diabetes  HgbA1c 7.4 goal < 7.0  Uncontrolled  CBG monitoring  SSI  close PCP follow up  Hypertension . Stable now  Long term BP goal 130-150 given severe extra- and intracranial stenosis  Hyperlipidemia  Home meds:  lipitor 40   LDL 22, goal < 70  Now decrease to lipitor 20, due to low level of LDL  Continue statin at discharge  Other Stroke Risk Factors  Advanced age  Obesity, Body mass index is 32.58 kg/m.   Hx stroke/TIA - right MCA and right PCA with lamina necrosis  Other Active Problems  Renal mass - concerning for mets  Hospital day # 2  Neurology will sign off. Please call with questions. Pt will need to follow up with her established neurology / neurosurgery or local neurology. Thanks for the consult.   Rosalin Hawking, MD PhD Stroke Neurology 10/06/2020 9:34 AM    To contact Stroke Continuity provider, please refer to [email or web address] After hours, contact General Neurology

## 2020-10-06 NOTE — Progress Notes (Addendum)
SLP Cancellation Note  Patient Details Name: Cheyenne Anthony MRN: [id number] DOB: Jan 01, 1946   Cancelled treatment:       Reason Eval/Treat Not Completed:  (chart reviewed; reviewed chart notes. Consulted NSG.). Per NSG report and chart notes, pt's Na is HIGH - 163; Chloride HIGH - 130.   Pt is tolerating bites/sips of po's at meals, and Pills Crushed in Puree. She has actually eaten 100% at a meal per the Dtr's report; diet consistency is allowing for safe, easier nutritional intake and support. Pt assists a little during the meals, and per NSG, pt is more awake each day. Pt and family are being followed by Palliative Care services; family confirms that pt is to be Full Code status.  ST services will f/u w/ pt tomorrow monitoring pt's HIGH Chloride and Na; trials to upgrade diet consistency when appropriate. NSG updated and agreed.     Orinda Kenner, MS, CCC-SLP Speech Language Pathologist Rehab Services [phone] [facility] 10/06/2020, 11:03 AM

## 2020-10-07 DIAGNOSIS — C3490 Malignant neoplasm of unspecified part of unspecified bronchus or lung: Secondary | ICD-10-CM | POA: Diagnosis not present

## 2020-10-07 DIAGNOSIS — E870 Hyperosmolality and hypernatremia: Secondary | ICD-10-CM | POA: Diagnosis not present

## 2020-10-07 DIAGNOSIS — E232 Diabetes insipidus: Secondary | ICD-10-CM | POA: Diagnosis not present

## 2020-10-07 LAB — BASIC METABOLIC PANEL
Anion gap: 7 (ref 5–15)
Anion gap: 7 (ref 5–15)
BUN: 10 mg/dL (ref 8–23)
BUN: 14 mg/dL (ref 8–23)
CO2: 26 mmol/L (ref 22–32)
CO2: 26 mmol/L (ref 22–32)
Calcium: 9.3 mg/dL (ref 8.9–10.3)
Calcium: 9.2 mg/dL (ref 8.9–10.3)
Chloride: 123 mmol/L — ABNORMAL HIGH (ref 98–111)
Chloride: 118 mmol/L — ABNORMAL HIGH (ref 98–111)
Creatinine, Ser: 0.79 mg/dL (ref 0.44–1.00)
Creatinine, Ser: 0.84 mg/dL (ref 0.44–1.00)
GFR, Estimated: >60 mL/min (ref >60)
GFR, Estimated: >60 mL/min (ref >60)
Glucose, Bld: 189 mg/dL — ABNORMAL HIGH (ref 70–99)
Glucose, Bld: 161 mg/dL — ABNORMAL HIGH (ref 70–99)
Potassium: 3.1 mmol/L — ABNORMAL LOW (ref 3.5–5.1)
Potassium: 3.2 mmol/L — ABNORMAL LOW (ref 3.5–5.1)
Sodium: 156 mmol/L — ABNORMAL HIGH (ref 135–145)
Sodium: 151 mmol/L — ABNORMAL HIGH (ref 135–145)

## 2020-10-07 LAB — CBC
HCT: 34.5 % — ABNORMAL LOW (ref 36.0–46.0)
Hemoglobin: 10.2 g/dL — ABNORMAL LOW (ref 12.0–15.0)
MCH: 26.4 pg (ref 26.0–34.0)
MCHC: 29.6 g/dL — ABNORMAL LOW (ref 30.0–36.0)
MCV: 89.4 fL (ref 80.0–100.0)
Platelets: 463 10*3/uL — ABNORMAL HIGH (ref 150–400)
RBC: 3.86 MIL/uL — ABNORMAL LOW (ref 3.87–5.11)
RDW: 17.9 % — ABNORMAL HIGH (ref 11.5–15.5)
WBC: 9.5 10*3/uL (ref 4.0–10.5)
nRBC: 0 % (ref 0.0–0.2)

## 2020-10-07 LAB — GLUCOSE, CAPILLARY
Glucose-Capillary: 203 mg/dL — ABNORMAL HIGH (ref 70–99)
Glucose-Capillary: 213 mg/dL — ABNORMAL HIGH (ref 70–99)
Glucose-Capillary: 148 mg/dL — ABNORMAL HIGH (ref 70–99)
Glucose-Capillary: 155 mg/dL — ABNORMAL HIGH (ref 70–99)

## 2020-10-07 MED ORDER — INSULIN GLARGINE 100 UNIT/ML ~~LOC~~ SOLN
10.0000 [IU] | Freq: Two times a day (BID) | SUBCUTANEOUS | Status: DC
  Administered 2020-10-07 – 2020-10-16 (×18): 10 [IU] via SUBCUTANEOUS
  Filled 2020-10-07 (×21): qty 0.1

## 2020-10-07 MED ORDER — POTASSIUM CHLORIDE CRYS ER 20 MEQ PO TBCR
40.0000 meq | EXTENDED_RELEASE_TABLET | Freq: Once | ORAL | Status: AC
  Administered 2020-10-07: 40 meq via ORAL
  Filled 2020-10-07: qty 2

## 2020-10-07 NOTE — Progress Notes (Addendum)
PT Cancellation Note  Patient Details Name: Cheyenne Anthony MRN: [id number] DOB: [birth year]   Cancelled Treatment:    Reason Eval/Treat Not Completed: Medical issues which prohibited therapy (Per chart review, pt's hypernatremia remains outside of range appropriate for PT services. As this is the 4th day the patient has been medically inappropriate for PT services, I will complete the order and await new order once pt is medically ready.)   Dalana Pfahler C 10/07/2020, 8:56 AM

## 2020-10-07 NOTE — Progress Notes (Signed)
PROGRESS NOTE    Cheyenne Anthony  DUK:[id number] DOB: 12/01/1945 DOA: 10/03/2020 PCP: [place]   Brief Narrative: Taken from H&P. Cheyenne Anthony is a 75 y.o. 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  MRI with a small left precentral gyrus acute infarct. Concern of seizures with acute infarct and intracranial metastatic disease s/p craniotomy. Neurology was consulted, she was loaded with Keppra followed by start of Keppra twice daily.  EEG done-was negative for any epileptiform activity but focal motor seizures cannot be excluded.  Worsening hypernatremia, very diluted polyuria, labs consistent with diabetes insipidus. Started her on desmopressin and nephrology was consulted. Increasing the dose of desmopressin and HCTZ was added today due to persistent hypernatremia, started improving with some decrease in urinary output now.  Subjective: Patient appears lethargic but denies any complaint.  She was working with speech therapy.  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  Imaging positive for acute precentral gyrus CVA.  That can it self trigger seizures, right sided twitching is concerning for focal seizure-like activity.  Neurology was consulted-they are loaded her with Keppra and she was started on twice daily dose of Keppra. History of recent craniotomy secondary to cerebellar mets at [facility] in January 2022. CVA work-up with A1c of 7.4, lipid panel with LDL of 22 and HDL of 40, echocardiogram with normal EF and grade 1 diastolic dysfunction.  No other significant abnormality. EEG was negative for any specific epileptiform waves but focal motor seizures cannot be excluded.  Clinically seems improving. -Completed CVA work-up. -Continue with Keppra -Continue to monitor for any new neurologic deficit -Patient will go back to SNF  Hypernatremia/polyuria/diabetes insipidus.  Sodium with some improvement at 156 this morning. Urine osmolality very less as compared to serum.  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  Decreased urinary output to 2.7 from more than 6 L. -Continue desmopressin to 20 mic twice daily. -Continue HCTZ at 25 mg daily  -Continue D5 -Monitor sodium -Nephrology was consulted-appreciate their help.  Hypertension.  Blood pressure within goal. -Her home antihypertensives which include metoprolol, Zestoretic and nifedipine were held initially for permissive hypertension. -Can be restarted if needed now.  Type 2 diabetes mellitus with renal manifestations.  Uncontrolled with A1c of 7.4 -Continue with SSI  History of adrenal crisis in 08/2020.  Repeat a.m. cortisol today was within normal limit. Patient completed the tapering dose of prednisone.  History of lung cancer, metastatic to cerebellum status post craniotomy -No acute issues -She will continue outpatient follow-up with oncology. -Patient is very high risk for deterioration and death. -Palliative care was  consulted-had  discussion with daughter and she wants everything needs to be done for her mother, tried explaining that approach might harm her more than benefit.  Patient will remain full code along with full scope of care at this time.  Right ankle pain.  Imaging was obtained which was negative for any acute fractures but did show some small bony densities, consistent with loose bodies. -Symptomatic management   Objective: Vitals:   10/07/20 0522 10/07/20 0728 10/07/20 0800 10/07/20 1121  BP: (!) 128/96 (!) 150/84 138/86 (!) 103/57  Pulse: 95 99 99 98  Resp: 16 16 16 16   Temp: 99.8 F (37.7 C) 97.9 F (36.6 C) 98.6 F (37 C) 97.9 F (36.6 C)  TempSrc: Oral  Oral   SpO2: 98% 94% 100% 100%  Weight: 87.4 kg     Height:        Intake/Output Summary (Last 24 hours) at 10/07/2020 1605 Last data filed at 10/07/2020 7412 Gross per 24 hour  Intake --  Output 2700 ml  Net -2700 ml   Filed Weights   10/05/20 0502 10/06/20 0500 10/07/20 0522  Weight: 86.7 kg 86.1 kg 87.4 kg    Examination:  General.  Lethargic appearing lady, in no acute distress. Pulmonary.  Lungs clear bilaterally, normal respiratory effort. CV.  Regular rate and rhythm, no JVD, rub or murmur. Abdomen.  Soft, nontender, nondistended, BS positive. CNS.  Alert and oriented .  No focal neurologic deficit. Extremities.  No edema, no cyanosis, pulses intact and symmetrical. Psychiatry.  Judgment and insight appears impaired.  DVT prophylaxis: Lovenox Code Status: Full Family Communication: Discussed with daughter on phone. Disposition Plan:  Status is: Inpatient  Remains inpatient appropriate because:Inpatient level of care appropriate due to severity of illness   Dispo: The patient is from: SNF              Anticipated d/c is to: SNF              Patient currently is not medically stable to d/c.   Difficult to place patient No              Level of care: Med-Surg  All the records are reviewed and case  discussed with Care Management/Social Worker. Management plans discussed with the patient, nursing and they are in agreement.  Consultants:   Neurology  Nephrology  Palliative care  Procedures:  Antimicrobials:   Data Reviewed: I have personally reviewed following labs and imaging studies  CBC: Recent Labs  Lab 10/03/20 1818 10/05/20 1007 10/06/20 0237 10/07/20 0514  WBC 8.4 9.3 9.7 9.5  NEUTROABS 4.5  --   --   --   HGB 10.4* 10.3* 10.5* 10.2*  HCT 35.7* 35.8* 36.8 34.5*  MCV 89.9 90.9 91.8 89.4  PLT 498* 507* 528* 878*   Basic Metabolic Panel: Recent Labs  Lab 10/05/20 1007 10/05/20 1655 10/06/20 0237 10/06/20 1712 10/07/20 0514  NA 161* 161* 163* 158* 156*  K 3.2* 3.2* 3.8 3.4* 3.1*  CL 128* 129* >130* 127* 123*  CO2 26 26 24 24 26   GLUCOSE 255* 136* 187* 119* 189*  BUN 12 14 12 13 10   CREATININE 0.89 0.95 0.85 0.84 0.79  CALCIUM 9.4 9.2 9.5 9.3 9.3   GFR: Estimated Creatinine Clearance: 67 mL/min (by C-G formula based on SCr of 0.79 mg/dL). Liver Function Tests: No results for input(s): AST, ALT, ALKPHOS, BILITOT, PROT, ALBUMIN in the last 168 hours. No results for input(s): LIPASE, AMYLASE in the last 168 hours.  No results for input(s): AMMONIA in the last 168 hours. Coagulation Profile: Recent Labs  Lab 10/05/20 1007  INR 1.2   Cardiac Enzymes: No results for input(s): CKTOTAL, CKMB, CKMBINDEX, TROPONINI in the last 168 hours. BNP (last 3 results) No results for input(s): PROBNP in the last 8760 hours. HbA1C: No results for input(s): HGBA1C in the last 72 hours. CBG: Recent Labs  Lab 10/19/20 1155 2020-10-19 1636 2020/10/19 2115 10/07/20 0728 10/07/20 1119  GLUCAP 264* 108* 168* 203* 213*   Lipid Profile: No results for input(s): CHOL, HDL, LDLCALC, TRIG, CHOLHDL, LDLDIRECT in the last 72 hours. Thyroid Function Tests: No results for input(s): TSH, T4TOTAL, FREET4, T3FREE, THYROIDAB in the last 72 hours. Anemia Panel: No results for  input(s): VITAMINB12, FOLATE, FERRITIN, TIBC, IRON, RETICCTPCT in the last 72 hours. Sepsis Labs: No results for input(s): PROCALCITON, LATICACIDVEN in the last 168 hours.  Recent Results (from the past 240 hour(s))  SARS CORONAVIRUS 2 (TAT 6-24 HRS) Nasopharyngeal Nasopharyngeal Swab     Status: None   Collection Time: 10/03/20  7:56 PM   Specimen: Nasopharyngeal Swab  Result Value Ref Range Status   SARS Coronavirus 2 NEGATIVE NEGATIVE Final    Comment: (NOTE) SARS-CoV-2 target nucleic acids are NOT DETECTED.  The SARS-CoV-2 RNA is generally detectable in upper and lower respiratory specimens during the acute phase of infection. Negative results do not preclude SARS-CoV-2 infection, do not rule out co-infections with other pathogens, and should not be used as the sole basis for treatment or other patient management decisions. Negative results must be combined with clinical observations, patient history, and epidemiological information. The expected result is Negative.  Fact Sheet for Patients: SugarRoll.be  Fact Sheet for Healthcare Providers: https://www.woods-mathews.com/  This test is not yet approved or cleared by the [place] FDA and  has been authorized for detection and/or diagnosis of SARS-CoV-2 by FDA under an Emergency Use Authorization (EUA). This EUA will remain  in effect (meaning this test can be used) for the duration of the COVID-19 declaration under Se ction 564(b)(1) of the Act, 21 U.S.C. section 360bbb-3(b)(1), unless the authorization is terminated or revoked sooner.  Performed at [facility], [place] [address] [place], [place] [address]      Radiology Studies: EEG adult  Result Date: 10/19/2020 Lora Havens, MD     10/19/20  4:46 PM Patient Name: Cheyenne Anthony MRN: [id number] Epilepsy Attending: Lora Havens Referring Physician/Provider: Dr Rosalin Hawking Date: 2020-10-19 Duration: 23.23 mins  Patient history: 75 y.o.femalewith PMH ofDM, HTN, lung cancer metastasis to right cerebellum s/p resection, DVT/PE on eliquis, renal mass, SNF resident admitted for right facial twitching and right hand shaking. . EEG to evaluate for seizure  Level of alertness:  lethargic  AEDs during EEG study: LEV  Technical aspects: This EEG study was done with scalp electrodes positioned according to the 10-20 International system of electrode placement. Electrical activity was acquired at a sampling rate of 500Hz  and reviewed with a high frequency filter of 70Hz  and a low frequency filter of 1Hz . EEG data were recorded continuously and digitally stored.  Description: No posterior dominant rhythm was seen. EEG showed continuous generalized 3 to 6 Hz theta-delta slowing. Photic driving was not seen during photic stimulation.  Hyperventilation was not performed.    ABNORMALITY - Continuous slow, generalized  IMPRESSION: This study is suggestive of moderate diffuse encephalopathy, nonspecific etiology. No seizures and epileptiform discharges were seen during this study [place]  Scheduled Meds: .  stroke: mapping our early stages of recovery book   Does not apply Once  . apixaban  5 mg Oral BID  . aspirin EC  81 mg Oral Daily  . atorvastatin  20 mg Oral Daily  . desmopressin  2 spray Nasal BID  . diclofenac Sodium  4 g Topical QID  . hydrochlorothiazide  25 mg Oral Daily  . insulin aspart  0-5 Units Subcutaneous QHS  . insulin aspart  0-9 Units Subcutaneous TID WC  . insulin glargine  10 Units Subcutaneous BID   Continuous Infusions: . dextrose 100 mL/hr at 10/07/20 1220  . levETIRAcetam 500 mg (10/07/20 1222)     LOS: 3 days   Time spent: 30 minutes. More than 50% of the time was spent in counseling/coordination of care  Lorella Nimrod, MD [facility]  If 7PM-7AM, please contact night-coverage [email or web address]  10/07/2020, 4:05 PM   This record has been created using Actor. Errors have been sought and corrected,but may not always be located. Such creation errors do not reflect on the standard of care.

## 2020-10-07 NOTE — Progress Notes (Signed)
[place], [place] 10/07/20  Subjective:   Hospital day # 3  Patient seen for hyper natremia She has been on DDAVP and also IV dextrose. Presently at 150 cc an hour. Lethargic but arousable and poorly communicative. Urine output is about 2.5 L. Potassium being supplemented orally.   Lab Results  Component Value Date   CREATININE 0.79 10/07/2020   CREATININE 0.84 10/06/2020   CREATININE 0.85 10/06/2020      Objective:  Vital signs in last 24 hours:  Temp:  [97.9 F (36.6 C)-99.8 F (37.7 C)] 97.9 F (36.6 C) (03/12 1121) Pulse Rate:  [89-101] 98 (03/12 1121) Resp:  [16-20] 16 (03/12 1121) BP: (103-150)/(57-96) 103/57 (03/12 1121) SpO2:  [94 %-100 %] 100 % (03/12 1121) Weight:  [87.4 kg] 87.4 kg (03/12 0522)  Weight change: 1.3 kg Filed Weights   10/05/20 0502 10/06/20 0500 10/07/20 0522  Weight: 86.7 kg 86.1 kg 87.4 kg    Intake/Output:    Intake/Output Summary (Last 24 hours) at 10/07/2020 1127 Last data filed at 10/07/2020 2094 Gross per 24 hour  Intake --  Output 2700 ml  Net -2700 ml     Physical Exam: General: Not in any acute distress, well built  HEENT Within normal limits  Neck Supple  Pulm/lungs Clear bilaterally for percussion auscultation  CVS/Heart S1-S2 regular in rate and rhythm  Abdomen:  Soft, bowel sounds positive, nontender  Extremities: Trace to 1+ edema bilaterally  Neurologic: Awake and alert     Access:        Basic Metabolic Panel:  Recent Labs  Lab 10/05/20 1007 10/05/20 1655 10/06/20 0237 10/06/20 1712 10/07/20 0514  NA 161* 161* 163* 158* 156*  K 3.2* 3.2* 3.8 3.4* 3.1*  CL 128* 129* >130* 127* 123*  CO2 26 26 24 24 26   GLUCOSE 255* 136* 187* 119* 189*  BUN 12 14 12 13 10   CREATININE 0.89 0.95 0.85 0.84 0.79  CALCIUM 9.4 9.2 9.5 9.3 9.3     CBC: Recent Labs  Lab 10/03/20 1818 10/05/20 1007 10/06/20 0237 10/07/20 0514  WBC 8.4 9.3 9.7 9.5  NEUTROABS 4.5  --   --   --    HGB 10.4* 10.3* 10.5* 10.2*  HCT 35.7* 35.8* 36.8 34.5*  MCV 89.9 90.9 91.8 89.4  PLT 498* 507* 528* 463*     No results found for: HEPBSAG, HEPBSAB, HEPBIGM    Microbiology:  Recent Results (from the past 240 hour(s))  SARS CORONAVIRUS 2 (TAT 6-24 HRS) Nasopharyngeal Nasopharyngeal Swab     Status: None   Collection Time: 10/03/20  7:56 PM   Specimen: Nasopharyngeal Swab  Result Value Ref Range Status   SARS Coronavirus 2 NEGATIVE NEGATIVE Final    Comment: (NOTE) SARS-CoV-2 target nucleic acids are NOT DETECTED.  The SARS-CoV-2 RNA is generally detectable in upper and lower respiratory specimens during the acute phase of infection. Negative results do not preclude SARS-CoV-2 infection, do not rule out co-infections with other pathogens, and should not be used as the sole basis for treatment or other patient management decisions. Negative results must be combined with clinical observations, patient history, and epidemiological information. The expected result is Negative.  Fact Sheet for Patients: SugarRoll.be  Fact Sheet for Healthcare Providers: https://www.woods-mathews.com/  This test is not yet approved or cleared by the [place] FDA and  has been authorized for detection and/or diagnosis of SARS-CoV-2 by FDA under an Emergency Use Authorization (EUA). This EUA will remain  in effect (  meaning this test can be used) for the duration of the COVID-19 declaration under Se ction 564(b)(1) of the Act, 21 U.S.C. section 360bbb-3(b)(1), unless the authorization is terminated or revoked sooner.  Performed at [facility], [place] [address], [place], [place] [address]     Coagulation Studies: Recent Labs    10/05/20 1007  LABPROT 14.7  INR 1.2    Urinalysis: No results for input(s): COLORURINE, LABSPEC, PHURINE, GLUCOSEU, HGBUR, BILIRUBINUR, KETONESUR, PROTEINUR, UROBILINOGEN, NITRITE, LEUKOCYTESUR in the last 72  hours.  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 . EEG to evaluate for seizure  Level of alertness:  lethargic  AEDs during EEG study: LEV  Technical aspects: This EEG study was done with scalp electrodes positioned according to the 10-20 International system of electrode placement. Electrical activity was acquired at a sampling rate of 500Hz  and reviewed with a high frequency filter of 70Hz  and a low frequency filter of 1Hz . EEG data were recorded continuously and digitally stored.  Description: No posterior dominant rhythm was seen. EEG showed continuous generalized 3 to 6 Hz theta-delta slowing. Photic driving was not seen during photic stimulation.  Hyperventilation was not performed.    ABNORMALITY - Continuous slow, generalized  IMPRESSION: This study is suggestive of moderate diffuse encephalopathy, nonspecific etiology. No seizures and epileptiform discharges were seen during this study [place]     Medications:   . dextrose 150 mL/hr at 10/07/20 0407  . levETIRAcetam 500 mg (10/07/20 0050)   .  stroke: mapping our early stages of recovery book   Does not apply Once  . apixaban  5 mg Oral BID  . aspirin EC  81 mg Oral Daily  . atorvastatin  20 mg Oral Daily  . desmopressin  2 spray Nasal BID  . diclofenac Sodium  4 g Topical QID  . hydrochlorothiazide  25 mg Oral Daily  . insulin aspart  0-5 Units Subcutaneous QHS  . insulin aspart  0-9 Units Subcutaneous TID WC  . insulin glargine  10 Units Subcutaneous BID      Assessment/ Plan:   75 y.o. 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   Episode of shaking   CVA (cerebral vascular accident) ([place])   Hypernatremia   Pressure injury of skin   Adrenal insufficiency, primary ([place])  Shaking [R25.1] CVA (cerebral vascular accident) ([place]) [N62.9] Embolic stroke ([place]) [B28.4] Cerebrovascular accident (CVA), unspecified mechanism ([place]) [I63.9]  1. Hypernatremia: with free water wasting and central diabetes insipidus. Calculated free water deficit of 6.4 liters Volume depleted on examination -Increased to 163 - increased DDAVP intranasaly 33mcg bid by primary team - Dextrose infusion decreased 200 cc an hour. - Continue  hydrochlorothiazide 25mg  daily and supplement potassium as ordered.  2. Hypertension: BP controlled on present treatment.  3. Adrenal insufficiency:  - hydrocortisone  4. Hypokalemia: potassium chloride.  Supplement as ordered    LOS: [place] 3/12/202211:27 AM  [place], [place]  Note: This note was prepared with Dragon dictation. Any transcription errors are unintentional

## 2020-10-07 NOTE — Progress Notes (Signed)
  Speech Language Pathology Treatment: Dysphagia  Patient Details Name: Cheyenne Anthony MRN: [id number] DOB: 15-Mar-1946 Today's Date: 10/07/2020 Time: 1751-0258 SLP Time Calculation (min) (ACUTE ONLY): 35 min  Assessment / Plan / Recommendation Clinical Impression  Skilled treatment session focused on pt's ongoing dysphagia management. Pt was awake, alert and interactive/conversant during this session. She reports that she doesn't like the puree diet or nectar thick liquids. SLP facilitated session by providing skilled observation of pt consuming thin liquids via cup, puree, dysphagia 2 and dysphagia 3 food items. Pt demonstrated adequate oropharyngeal abilities when consuming thin liquids and dysphagia 2 items. When consuming thin liquids, pt's swallow appeared swift and was free of any overt s/s of aspiration at bedside as her voice remained clear and her sats were stable. She demonstrated appropriate mastication of both dysphagia 2 and dysphagia 3 food items but had a more prolonged oral phase with dysphagia 3 items. Therefore, recommend upgrading pt to dysphagia 2 with thin liquids via cup and medicine whole in puree.   At this time, further diet advancement will be deferred to next venue of care.     HPI HPI: Pt is a 75 y.o. 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  Patient denied feeling weak in the arm face or leg, or slurred  speech or drooling, headache.  Has had no fevers or chills.  She had not been recently ill except for recent hospitalization.  ED course: Patient awake and alert and oriented on arrival.  Temperature 99.2, BP 119/78, pulse 86 and respirations 16 with O2 sat 98% on room air.  Blood work significant for hemoglobin of 10.4 which is around her baseline.  BMP noted to have high sodium and chloride, otherwise unremarkable.  EKG as reviewed by me: Sinus tachycardia at 100 with nonspecific ST-T wave changes.  Imaging: CT head "Gyriform hyperdensity in the right occipital lobe likely reflecting interval, now subacute infarction with laminar necrosis or petechial hemorrhage. Postoperative changes in the posterior fossa.  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 changes.".      SLP Plan  All goals met       Recommendations  Diet recommendations: Dysphagia 2 (fine chop);Thin liquid Liquids provided via: Cup Medication Administration: Whole meds with puree Supervision: Staff to assist with self feeding Compensations: Minimize environmental distractions;Slow rate;Small sips/bites Postural Changes and/or Swallow Maneuvers: Seated upright 90 degrees                Oral Care Recommendations: Oral care BID Follow up Recommendations: Skilled Nursing facility SLP Visit Diagnosis: Dysphagia, oropharyngeal phase (R13.12);Cognitive communication deficit (R41.841) Plan: All goals met       GO              Cheyenne Anthony B. Rutherford Nail M.S., CCC-SLP, [place] Office [phone]   Stormy Fabian 10/07/2020, 12:27 PM

## 2020-10-08 DIAGNOSIS — C3490 Malignant neoplasm of unspecified part of unspecified bronchus or lung: Secondary | ICD-10-CM | POA: Diagnosis not present

## 2020-10-08 DIAGNOSIS — E870 Hyperosmolality and hypernatremia: Secondary | ICD-10-CM | POA: Diagnosis not present

## 2020-10-08 DIAGNOSIS — E232 Diabetes insipidus: Secondary | ICD-10-CM | POA: Diagnosis not present

## 2020-10-08 LAB — BASIC METABOLIC PANEL
Anion gap: 5 (ref 5–15)
BUN: 13 mg/dL (ref 8–23)
CO2: 29 mmol/L (ref 22–32)
Calcium: 9 mg/dL (ref 8.9–10.3)
Chloride: 116 mmol/L — ABNORMAL HIGH (ref 98–111)
Creatinine, Ser: 0.79 mg/dL (ref 0.44–1.00)
GFR, Estimated: >60 mL/min (ref >60)
Glucose, Bld: 101 mg/dL — ABNORMAL HIGH (ref 70–99)
Potassium: 2.9 mmol/L — ABNORMAL LOW (ref 3.5–5.1)
Sodium: 150 mmol/L — ABNORMAL HIGH (ref 135–145)

## 2020-10-08 LAB — GLUCOSE, CAPILLARY
Glucose-Capillary: 112 mg/dL — ABNORMAL HIGH (ref 70–99)
Glucose-Capillary: 122 mg/dL — ABNORMAL HIGH (ref 70–99)
Glucose-Capillary: 145 mg/dL — ABNORMAL HIGH (ref 70–99)
Glucose-Capillary: 134 mg/dL — ABNORMAL HIGH (ref 70–99)

## 2020-10-08 LAB — MAGNESIUM: Magnesium: 2.4 mg/dL (ref 1.7–2.4)

## 2020-10-08 MED ORDER — POTASSIUM CHLORIDE CRYS ER 20 MEQ PO TBCR
40.0000 meq | EXTENDED_RELEASE_TABLET | ORAL | Status: AC
  Administered 2020-10-08 (×3): 40 meq via ORAL
  Filled 2020-10-08 (×3): qty 2

## 2020-10-08 NOTE — Progress Notes (Signed)
PROGRESS NOTE    Jozee Hammer  QIO:[id number] DOB: 1946/07/13 DOA: 10/03/2020 PCP: [place]   Brief Narrative: Taken from H&P. Dian Laprade is a 75 y.o. 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  MRI with a small left precentral gyrus acute infarct. Concern of seizures with acute infarct and intracranial metastatic disease s/p craniotomy. Neurology was consulted, she was loaded with Keppra followed by start of Keppra twice daily.  EEG done-was negative for any epileptiform activity but focal motor seizures cannot be excluded.  Worsening hypernatremia, very diluted polyuria, labs consistent with diabetes insipidus. Started her on desmopressin and nephrology was consulted. Increasing the dose of desmopressin and HCTZ was added today due to persistent hypernatremia, started improving with some decrease in urinary output now.  Subjective: Patient appears lethargic but denies any complaint.  She was asking for some ice and water.  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  Imaging positive for acute precentral gyrus CVA.  That can it self trigger seizures, right sided twitching is concerning for focal seizure-like activity.  Neurology was consulted-they are loaded her with Keppra and she was started on twice daily dose of Keppra. History of recent craniotomy secondary to cerebellar mets at [facility] in January 2022. CVA work-up with A1c of 7.4, lipid panel with LDL of 22 and HDL of 40, echocardiogram with normal EF and grade 1 diastolic dysfunction.  No other significant abnormality. EEG was negative for any specific epileptiform waves but focal motor seizures cannot be excluded.  Clinically seems improving. -Completed CVA work-up. -Continue with Keppra -Continue to monitor for any new neurologic deficit -Patient will go back to SNF  Hypernatremia/polyuria/diabetes insipidus.  Sodium with some improvement at 150 this morning. Urine osmolality very less as compared to serum.  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  Decreased urinary output to 2.7 from more than 6 L. -Continue desmopressin to 20 mic twice daily. -Nephrology decided to hold HCTZ at this time -Continue D5-decrease the rate to 50 mL/h -Monitor sodium -Nephrology was consulted-appreciate their help.  Hypertension.  Blood pressure within goal. -Her home antihypertensives which include metoprolol, Zestoretic and nifedipine were held initially for permissive hypertension. -Can be restarted if needed now.  Type 2 diabetes mellitus with renal manifestations.  Uncontrolled with A1c of 7.4 -Continue with SSI  History of adrenal crisis in 08/2020.  Repeat a.m. cortisol  was within normal limit. Patient completed the tapering dose of prednisone.  History of lung cancer, metastatic to cerebellum status post craniotomy -No acute issues -She will continue outpatient follow-up with oncology. -Patient is very high risk for  deterioration and death. -Palliative care was consulted-had discussion with daughter and she wants everything needs to be done for her mother, tried explaining that approach might harm her more than benefit.  Patient will remain full code along with full scope of care at this time.  Right ankle pain.  Imaging was obtained which was negative for any acute fractures but did show some small bony densities, consistent with loose bodies. -Symptomatic management   Objective: Vitals:   10/07/20 1936 10/08/20 0538 10/08/20 0728 10/08/20 1232  BP: 127/90 (!) 145/123 140/81 131/65  Pulse: (!) 103 91 94 93  Resp: 16 18 17 20   Temp: (!) 97.5 F (36.4 C) 98.1 F (36.7 C) 98.3 F (36.8 C) 99.4 F (37.4 C)  TempSrc: Oral Oral Oral Oral  SpO2: 100% 95% 97% 98%  Weight:  86 kg    Height:        Intake/Output Summary (Last 24 hours) at 10/08/2020 1408 Last data filed at 10/08/2020 1042 Gross per 24 hour  Intake 7001.59 ml  Output 2250 ml  Net 4751.59 ml   Filed Weights   10/06/20 0500 10/07/20 0522 10/08/20 0538  Weight: 86.1 kg 87.4 kg 86 kg    Examination:  General.  Chronically ill-appearing lady, in no acute distress. Pulmonary.  Lungs clear bilaterally, normal respiratory effort. CV.  Regular rate and rhythm, no JVD, rub or murmur. Abdomen.  Soft, nontender, nondistended, BS positive. CNS.  Alert and oriented x3.  No focal neurologic deficit. Extremities.  No edema, no cyanosis, pulses intact and symmetrical. Psychiatry.  Judgment and insight appears normal.  DVT prophylaxis: Lovenox Code Status: Full Family Communication: None Disposition Plan:  Status is: Inpatient  Remains inpatient appropriate because:Inpatient level of care appropriate due to severity of illness   Dispo: The patient is from: SNF              Anticipated d/c is to: SNF              Patient currently is not medically stable to d/c.   Difficult to place patient No              Level of care:  Med-Surg  All the records are reviewed and case discussed with Care Management/Social Worker. Management plans discussed with the patient, nursing and they are in agreement.  Consultants:   Neurology  Nephrology  Palliative care  Procedures:  Antimicrobials:   Data Reviewed: I have personally reviewed following labs and imaging studies  CBC: Recent Labs  Lab 10/03/20 1818 10/05/20 1007 10/06/20 0237 10/07/20 0514  WBC 8.4 9.3 9.7 9.5  NEUTROABS 4.5  --   --   --   HGB 10.4* 10.3* 10.5* 10.2*  HCT 35.7* 35.8* 36.8 34.5*  MCV 89.9 90.9 91.8 89.4  PLT 498* 507* 528* 258*   Basic Metabolic Panel: Recent Labs  Lab 10/06/20 0237 10/06/20 1712 10/07/20 0514 10/07/20 1647 10/08/20 0437  NA 163* 158* 156* 151* 150*  K 3.8 3.4* 3.1* 3.2* 2.9*  CL >130* 127* 123* 118* 116*  CO2 24 24 26 26 29   GLUCOSE 187* 119* 189* 161* 101*  BUN 12 13 10 14 13   CREATININE 0.85 0.84 0.79 0.84 0.79  CALCIUM 9.5 9.3 9.3 9.2 9.0  MG  --   --   --   --  2.4   GFR: Estimated Creatinine Clearance: 65.5 mL/min (by C-G formula based on SCr of 0.79 mg/dL). Liver Function Tests: No results for input(s): AST, ALT, ALKPHOS,  BILITOT, PROT, ALBUMIN in the last 168 hours. No results for input(s): LIPASE, AMYLASE in the last 168 hours. No results for input(s): AMMONIA in the last 168 hours. Coagulation Profile: Recent Labs  Lab 10/05/20 1007  INR 1.2   Cardiac Enzymes: No results for input(s): CKTOTAL, CKMB, CKMBINDEX, TROPONINI in the last 168 hours. BNP (last 3 results) No results for input(s): PROBNP in the last 8760 hours. HbA1C: No results for input(s): HGBA1C in the last 72 hours. CBG: Recent Labs  Lab 10/07/20 1119 10/07/20 1622 10/07/20 2112 10/08/20 0729 10/08/20 1148  GLUCAP 213* 148* 155* 112* 122*   Lipid Profile: No results for input(s): CHOL, HDL, LDLCALC, TRIG, CHOLHDL, LDLDIRECT in the last 72 hours. Thyroid Function Tests: No results for input(s): TSH, T4TOTAL,  FREET4, T3FREE, THYROIDAB in the last 72 hours. Anemia Panel: No results for input(s): VITAMINB12, FOLATE, FERRITIN, TIBC, IRON, RETICCTPCT in the last 72 hours. Sepsis Labs: No results for input(s): PROCALCITON, LATICACIDVEN in the last 168 hours.  Recent Results (from the past 240 hour(s))  SARS CORONAVIRUS 2 (TAT 6-24 HRS) Nasopharyngeal Nasopharyngeal Swab     Status: None   Collection Time: 10/03/20  7:56 PM   Specimen: Nasopharyngeal Swab  Result Value Ref Range Status   SARS Coronavirus 2 NEGATIVE NEGATIVE Final    Comment: (NOTE) SARS-CoV-2 target nucleic acids are NOT DETECTED.  The SARS-CoV-2 RNA is generally detectable in upper and lower respiratory specimens during the acute phase of infection. Negative results do not preclude SARS-CoV-2 infection, do not rule out co-infections with other pathogens, and should not be used as the sole basis for treatment or other patient management decisions. Negative results must be combined with clinical observations, patient history, and epidemiological information. The expected result is Negative.  Fact Sheet for Patients: SugarRoll.be  Fact Sheet for Healthcare Providers: https://www.woods-mathews.com/  This test is not yet approved or cleared by the [place] FDA and  has been authorized for detection and/or diagnosis of SARS-CoV-2 by FDA under an Emergency Use Authorization (EUA). This EUA will remain  in effect (meaning this test can be used) for the duration of the COVID-19 declaration under Se ction 564(b)(1) of the Act, 21 U.S.C. section 360bbb-3(b)(1), unless the authorization is terminated or revoked sooner.  Performed at [facility], [place] [address], [place], [place] [address]      Radiology Studies: EEG adult  Result Date: 2020-10-13 Lora Havens, MD     2020-10-13  4:46 PM Patient Name: Ame Heagle MRN: [id number] Epilepsy Attending: Lora Havens  Referring Physician/Provider: Dr Rosalin Hawking Date: 10/13/20 Duration: 23.23 mins Patient history: 75 y.o.femalewith PMH ofDM, HTN, lung cancer metastasis to right cerebellum s/p resection, DVT/PE on eliquis, renal mass, SNF resident admitted for right facial twitching and right hand shaking. . EEG to evaluate for seizure  Level of alertness:  lethargic  AEDs during EEG study: LEV  Technical aspects: This EEG study was done with scalp electrodes positioned according to the 10-20 International system of electrode placement. Electrical activity was acquired at a sampling rate of 500Hz  and reviewed with a high frequency filter of 70Hz  and a low frequency filter of 1Hz . EEG data were recorded continuously and digitally stored.  Description: No posterior dominant rhythm was seen. EEG showed continuous generalized 3 to 6 Hz theta-delta slowing. Photic driving was not seen during photic stimulation.  Hyperventilation was not performed.    ABNORMALITY - Continuous slow, generalized  IMPRESSION: This study is suggestive of moderate diffuse  encephalopathy, nonspecific etiology. No seizures and epileptiform discharges were seen during this study [place]    Scheduled Meds: .  stroke: mapping our early stages of recovery book   Does not apply Once  . apixaban  5 mg Oral BID  . aspirin EC  81 mg Oral Daily  . atorvastatin  20 mg Oral Daily  . desmopressin  2 spray Nasal BID  . diclofenac Sodium  4 g Topical QID  . insulin aspart  0-5 Units Subcutaneous QHS  . insulin aspart  0-9 Units Subcutaneous TID WC  . insulin glargine  10 Units Subcutaneous BID  . potassium chloride  40 mEq Oral Q4H   Continuous Infusions: . dextrose 50 mL/hr at 10/08/20 1238  . levETIRAcetam 500 mg (10/08/20 1235)     LOS: 4 days   Time spent: 30 minutes. More than 50% of the time was spent in counseling/coordination of care  Lorella Nimrod, MD [facility]  If 7PM-7AM, please contact  night-coverage [email or web address]  10/08/2020, 2:08 PM   This record has been created using Dragon voice recognition software. Errors have been sought and corrected,but may not always be located. Such creation errors do not reflect on the standard of care.

## 2020-10-08 NOTE — Progress Notes (Signed)
[facility]  PROGRESS NOTE   Subjective:   Patient seen at bedside. Much more talkative today. Eating her breakfast. Has been on IV dextrose infusion.  Objective:  Vital signs in last 24 hours:  Temp:  [97.5 F (36.4 C)-98.3 F (36.8 C)] 98.3 F (36.8 C) (03/13 0728) Pulse Rate:  [91-103] 94 (03/13 0728) Resp:  [16-18] 17 (03/13 0728) BP: (123-145)/(55-123) 140/81 (03/13 0728) SpO2:  [95 %-100 %] 97 % (03/13 0728) Weight:  [86 kg] 86 kg (03/13 0538)  Weight change: -1.4 kg Filed Weights   10/06/20 0500 10/07/20 0522 10/08/20 0538  Weight: 86.1 kg 87.4 kg 86 kg    Intake/Output: I/O last 3 completed shifts: In: 7282.1 [I.V.:6781.6; IV Piggyback:500.5] Out: 3664 [Urine:4850]   Intake/Output this shift:  Total I/O In: 120 [P.O.:120] Out: -   Physical Exam: General:  No acute distress  Head:  Normocephalic, atraumatic. Moist oral mucosal membranes  Eyes:  Anicteric  Neck:  Supple  Lungs:   Clear to auscultation, normal effort  Heart:  S1S2 no rubs  Abdomen:   Soft, nontender, bowel sounds present  Extremities:  peripheral edema.  Neurologic:  Awake, alert, following commands  Skin:  No lesions  Access:     Basic Metabolic Panel: Recent Labs  Lab 10/06/20 0237 10/06/20 1712 10/07/20 0514 10/07/20 1647 10/08/20 0437  NA 163* 158* 156* 151* 150*  K 3.8 3.4* 3.1* 3.2* 2.9*  CL >130* 127* 123* 118* 116*  CO2 24 24 26 26 29   GLUCOSE 187* 119* 189* 161* 101*  BUN 12 13 10 14 13   CREATININE 0.85 0.84 0.79 0.84 0.79  CALCIUM 9.5 9.3 9.3 9.2 9.0  MG  --   --   --   --  2.4    Liver Function Tests: No results for input(s): AST, ALT, ALKPHOS, BILITOT, PROT, ALBUMIN in the last 168 hours. No results for input(s): LIPASE, AMYLASE in the last 168 hours. No results for input(s): AMMONIA in the last 168 hours.  CBC: Recent Labs  Lab 10/03/20 1818 10/05/20 1007 10/06/20 0237 10/07/20 0514  WBC 8.4 9.3 9.7 9.5  NEUTROABS 4.5  --   --   --    HGB 10.4* 10.3* 10.5* 10.2*  HCT 35.7* 35.8* 36.8 34.5*  MCV 89.9 90.9 91.8 89.4  PLT 498* 507* 528* 463*    Cardiac Enzymes: No results for input(s): CKTOTAL, CKMB, CKMBINDEX, TROPONINI in the last 168 hours.  BNP: Invalid input(s): POCBNP  CBG: Recent Labs  Lab 10/07/20 0728 10/07/20 1119 10/07/20 1622 10/07/20 2112 10/08/20 0729  GLUCAP 203* 213* 148* 155* 112*    Microbiology: Results for orders placed or performed during the hospital encounter of 10/03/20  SARS CORONAVIRUS 2 (TAT 6-24 HRS) Nasopharyngeal Nasopharyngeal Swab     Status: None   Collection Time: 10/03/20  7:56 PM   Specimen: Nasopharyngeal Swab  Result Value Ref Range Status   SARS Coronavirus 2 NEGATIVE NEGATIVE Final    Comment: (NOTE) SARS-CoV-2 target nucleic acids are NOT DETECTED.  The SARS-CoV-2 RNA is generally detectable in upper and lower respiratory specimens during the acute phase of infection. Negative results do not preclude SARS-CoV-2 infection, do not rule out co-infections with other pathogens, and should not be used as the sole basis for treatment or other patient management decisions. Negative results must be combined with clinical observations, patient history, and epidemiological information. The expected result is Negative.  Fact Sheet for Patients: SugarRoll.be  Fact Sheet for Healthcare Providers: https://www.woods-mathews.com/  This test is not yet approved or cleared by the [place] and  has been authorized for detection and/or diagnosis of SARS-CoV-2 by FDA under an Emergency Use Authorization (EUA). This EUA will remain  in effect (meaning this test can be used) for the duration of the COVID-19 declaration under Se ction 564(b)(1) of the Act, 21 U.S.C. section 360bbb-3(b)(1), unless the authorization is terminated or revoked sooner.  Performed at [facility], [place] [address] [place], [place] [address]      Coagulation Studies: No results for input(s): LABPROT, INR in the last 72 hours.  Urinalysis: No results for input(s): COLORURINE, LABSPEC, PHURINE, GLUCOSEU, HGBUR, BILIRUBINUR, KETONESUR, PROTEINUR, UROBILINOGEN, NITRITE, LEUKOCYTESUR in the last 72 hours.  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 . EEG to evaluate for seizure  Level of alertness:  lethargic  AEDs during EEG study: LEV  Technical aspects: This EEG study was done with scalp electrodes positioned according to the 10-20 International system of electrode placement. Electrical activity was acquired at a sampling rate of 500Hz  and reviewed with a high frequency filter of 70Hz  and a low frequency filter of 1Hz . EEG data were recorded continuously and digitally stored.  Description: No posterior dominant rhythm was seen. EEG showed continuous generalized 3 to 6 Hz theta-delta slowing. Photic driving was not seen during photic stimulation.  Hyperventilation was not performed.    ABNORMALITY - Continuous slow, generalized  IMPRESSION: This study is suggestive of moderate diffuse encephalopathy, nonspecific etiology. No seizures and epileptiform discharges were seen during this study [place]     Medications:   . dextrose 100 mL/hr at 10/08/20 0600  . levETIRAcetam Stopped (10/08/20 0053)   .  stroke: mapping our early stages of recovery book   Does not apply Once  . apixaban  5 mg Oral BID  . aspirin EC  81 mg Oral Daily  . atorvastatin  20 mg Oral Daily  .  desmopressin  2 spray Nasal BID  . diclofenac Sodium  4 g Topical QID  . insulin aspart  0-5 Units Subcutaneous QHS  . insulin aspart  0-9 Units Subcutaneous TID WC  . insulin glargine  10 Units Subcutaneous BID  . potassium chloride  40 mEq Oral Q4H     Assessment/ Plan:   75 year old female was admitted on 3/8 for acute cerebrovascular accident.  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  Patient headache calculated free water deficit of 6.4 L on admission Has been on D5W now sodium has improved to 150 will decrease D5W to 50 cc an hour. Supplement potassium as ordered with 40 mEq 3 times today. We will stop the hydrochlorothiazide for now.  We will continue to follow closely.   LOS: [place] 3/13/202211:44 AM

## 2020-10-08 NOTE — Plan of Care (Signed)
Patient had arrived to unit this AM transfer from 2A. Patient oriented to unit procedures, patient is only A&O x1-2. Family was updated on POC this shift. No acute distress noted throughout shift. NEW PIV placed by IV team per request and after unsuccessful attempt by this writer. Patient resting in bed at 30 degrees. Bed alarm engaged. Call bell within reach. Will continue to monitor through the remainder of this writer's scheduled shift

## 2020-10-08 NOTE — Plan of Care (Signed)
  Problem: Education: Goal: Knowledge of disease or condition will improve Outcome: Progressing Goal: Knowledge of secondary prevention will improve Outcome: Progressing Goal: Knowledge of patient specific risk factors addressed and post discharge goals established will improve Outcome: Progressing   Problem: Coping: Goal: Will verbalize positive feelings about self Outcome: Progressing   Problem: Self-Care: Goal: Ability to participate in self-care as condition permits will improve Outcome: Progressing Goal: Ability to communicate needs accurately will improve Outcome: Progressing   Problem: Education: Goal: Knowledge of General Education information will improve Description: Including pain rating scale, medication(s)/side effects and non-pharmacologic comfort measures Outcome: Progressing   Problem: Health Behavior/Discharge Planning: Goal: Ability to manage health-related needs will improve Outcome: Progressing   Problem: Clinical Measurements: Goal: Ability to maintain clinical measurements within normal limits will improve Outcome: Progressing Goal: Will remain free from infection Outcome: Progressing Goal: Diagnostic test results will improve Outcome: Progressing Goal: Respiratory complications will improve Outcome: Progressing Goal: Cardiovascular complication will be avoided Outcome: Progressing   Problem: Activity: Goal: Risk for activity intolerance will decrease Outcome: Progressing   Problem: Nutrition: Goal: Adequate nutrition will be maintained Outcome: Progressing   Problem: Coping: Goal: Level of anxiety will decrease Outcome: Progressing   Problem: Elimination: Goal: Will not experience complications related to bowel motility Outcome: Progressing Goal: Will not experience complications related to urinary retention Outcome: Progressing   Problem: Pain Managment: Goal: General experience of comfort will improve Outcome: Progressing    Problem: Safety: Goal: Ability to remain free from injury will improve Outcome: Progressing   Problem: Skin Integrity: Goal: Risk for impaired skin integrity will decrease Outcome: Progressing    Pt's VSS. Pt afebrile. Purewick intact. Abx given. Will continue to monitor.

## 2020-10-09 DIAGNOSIS — E232 Diabetes insipidus: Secondary | ICD-10-CM | POA: Diagnosis not present

## 2020-10-09 DIAGNOSIS — C3490 Malignant neoplasm of unspecified part of unspecified bronchus or lung: Secondary | ICD-10-CM | POA: Diagnosis not present

## 2020-10-09 DIAGNOSIS — E870 Hyperosmolality and hypernatremia: Secondary | ICD-10-CM | POA: Diagnosis not present

## 2020-10-09 LAB — BASIC METABOLIC PANEL
Anion gap: 6 (ref 5–15)
BUN: 12 mg/dL (ref 8–23)
CO2: 28 mmol/L (ref 22–32)
Calcium: 8.8 mg/dL — ABNORMAL LOW (ref 8.9–10.3)
Chloride: 117 mmol/L — ABNORMAL HIGH (ref 98–111)
Creatinine, Ser: 0.85 mg/dL (ref 0.44–1.00)
GFR, Estimated: >60 mL/min (ref >60)
Glucose, Bld: 103 mg/dL — ABNORMAL HIGH (ref 70–99)
Potassium: 3.1 mmol/L — ABNORMAL LOW (ref 3.5–5.1)
Sodium: 151 mmol/L — ABNORMAL HIGH (ref 135–145)

## 2020-10-09 LAB — SODIUM: Sodium: 142 mmol/L (ref 135–145)

## 2020-10-09 LAB — GLUCOSE, CAPILLARY
Glucose-Capillary: 115 mg/dL — ABNORMAL HIGH (ref 70–99)
Glucose-Capillary: 150 mg/dL — ABNORMAL HIGH (ref 70–99)
Glucose-Capillary: 138 mg/dL — ABNORMAL HIGH (ref 70–99)
Glucose-Capillary: 197 mg/dL — ABNORMAL HIGH (ref 70–99)

## 2020-10-09 LAB — MAGNESIUM: Magnesium: 2.4 mg/dL (ref 1.7–2.4)

## 2020-10-09 MED ORDER — LATANOPROST 0.005 % OP SOLN
1.0000 [drp] | Freq: Every day | OPHTHALMIC | Status: DC
  Administered 2020-10-09 – 2020-10-15 (×7): 1 [drp] via OPHTHALMIC
  Filled 2020-10-09: qty 2.5

## 2020-10-09 MED ORDER — POTASSIUM CHLORIDE CRYS ER 20 MEQ PO TBCR
40.0000 meq | EXTENDED_RELEASE_TABLET | Freq: Two times a day (BID) | ORAL | Status: DC
  Administered 2020-10-09 – 2020-10-16 (×15): 40 meq via ORAL
  Filled 2020-10-09 (×15): qty 2

## 2020-10-09 NOTE — Progress Notes (Signed)
SLP Cancellation Note  Patient Details Name: Cheyenne Anthony MRN: [id number] DOB: Jul 24, 1946   Cancelled treatment:       Reason Eval/Treat Not Completed:  (pt sleeping soundly). Consulted NSG re: pt's status today w/ meals, oral intake. Pt noted to have coughed w/ NSG x1 w/straw use earlier; not noted when Dtr was helping her at the Lunch meal. Discussed pt's baseline risk for aspiration and POC to include NOT using a straw, ensure full upright positioning w/ po's, and use of aspiration precautions including Pills in Puree. NSG will note if any s/s of aspiration noted tonight w/ Dinner meal. ST will f/u w/ pt in the morning and w/ Dtr w/ POC if any adjustments need to be made. NSG agreed. Spoke w/ CNA re: precautions and No straw use at [facility].      Orinda Kenner, [place], [place] Speech Language Pathologist Rehab Services [phone] [facility] 10/09/2020, 4:51 PM

## 2020-10-09 NOTE — Progress Notes (Signed)
[place], [place] 10/09/20  Subjective:   LOS: 5  Cheyenne Anthony is a 75 y.o. 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 She presents to ED after reported shaking episode of right arm and face.   She is admitted for Acute CVA. She has been started on Aspirin and Keppra.  We have been consulted for hypernatremia. Sodium was 162 on admission. Currently receiving DDAVP and on dextrose IVF.   Patient seen resting in bed Alert and oriented to self Per nursing, poor appetite Denies nausea Denies shortness of breath Patient seen later very drowsy  UOP-1.3L IVF-D5 @ 50 ml/hr   Objective:  Vital signs in last 24 hours:  Temp:  [99.4 F (37.4 C)-100.1 F (37.8 C)] 100 F (37.8 C) (03/14 0815) Pulse Rate:  [93-103] 98 (03/14 0815) Resp:  [15-20] 15 (03/14 0815) BP: (101-131)/(65-83) 107/83 (03/14 0815) SpO2:  [89 %-100 %] 91 % (03/14 0815)  Weight change:  Filed Weights   10/06/20 0500 10/07/20 0522 10/08/20 0538  Weight: 86.1 kg 87.4 kg 86 kg    Intake/Output:    Intake/Output Summary (Last 24 hours) at 10/09/2020 1026 Last data filed at 10/09/2020 0626 Gross per 24 hour  Intake 360 ml  Output 1300 ml  Net -940 ml    Physical Exam: General: NAD, laying in bed  Head: Normocephalic, atraumatic. Dry oral mucosal membranes  Eyes: Anicteric  Neck: Supple, trachea midline  Lungs:  Clear to auscultation  Heart: Regular rate and rhythm  Abdomen:  Soft, nontender,   Extremities: no peripheral edema.  Neurologic: alert to self  Skin: No lesions    Basic Metabolic Panel:  Recent Labs  Lab 10/06/20 1712 10/07/20 0514 10/07/20 1647 10/08/20 0437 10/09/20 0535  NA 158* 156* 151* 150* 151*  K 3.4* 3.1* 3.2* 2.9* 3.1*  CL 127* 123* 118* 116* 117*  CO2 24 26 26 29 28   GLUCOSE 119* 189* 161* 101* 103*  BUN 13 10 14 13 12   CREATININE 0.84 0.79 0.84 0.79 0.85  CALCIUM 9.3  9.3 9.2 9.0 8.8*  MG  --   --   --  2.4 2.4     CBC: Recent Labs  Lab 10/03/20 1818 10/05/20 1007 10/06/20 0237 10/07/20 0514  WBC 8.4 9.3 9.7 9.5  NEUTROABS 4.5  --   --   --   HGB 10.4* 10.3* 10.5* 10.2*  HCT 35.7* 35.8* 36.8 34.5*  MCV 89.9 90.9 91.8 89.4  PLT 498* 507* 528* 463*     No results found for: HEPBSAG, HEPBSAB, HEPBIGM    Microbiology:  Recent Results (from the past 240 hour(s))  SARS CORONAVIRUS 2 (TAT 6-24 HRS) Nasopharyngeal Nasopharyngeal Swab     Status: None   Collection Time: 10/03/20  7:56 PM   Specimen: Nasopharyngeal Swab  Result Value Ref Range Status   SARS Coronavirus 2 NEGATIVE NEGATIVE Final    Comment: (NOTE) SARS-CoV-2 target nucleic acids are NOT DETECTED.  The SARS-CoV-2 RNA is generally detectable in upper and lower respiratory specimens during the acute phase of infection. Negative results do not preclude SARS-CoV-2 infection, do not rule out co-infections with other pathogens, and should not be used as the sole basis for treatment or other patient management decisions. Negative results must be combined with clinical observations, patient history, and epidemiological information. The expected result is Negative.  Fact Sheet for Patients: SugarRoll.be  Fact Sheet for Healthcare Providers: https://www.woods-mathews.com/  This test is not yet approved or cleared by the [place] and  has been authorized for detection and/or diagnosis of SARS-CoV-2 by FDA under an Emergency Use Authorization (EUA). This EUA will remain  in effect (meaning this test can be used) for the duration of the COVID-19 declaration under Se ction 564(b)(1) of the Act, 21 U.S.C. section 360bbb-3(b)(1), unless the authorization is terminated or revoked sooner.  Performed at [facility], [place] [address], [place], [place] [address]     Coagulation Studies: No results for input(s): LABPROT, INR in  the last 72 hours.  Urinalysis: No results for input(s): COLORURINE, LABSPEC, PHURINE, GLUCOSEU, HGBUR, BILIRUBINUR, KETONESUR, PROTEINUR, UROBILINOGEN, NITRITE, LEUKOCYTESUR in the last 72 hours.  Invalid input(s): APPERANCEUR    Imaging: No results found.   Medications:   . dextrose 50 mL/hr at 10/09/20 0609  . levETIRAcetam 500 mg (10/09/20 0355)   .  stroke: mapping our early stages of recovery book   Does not apply Once  . apixaban  5 mg Oral BID  . aspirin EC  81 mg Oral Daily  . atorvastatin  20 mg Oral Daily  . desmopressin  2 spray Nasal BID  . diclofenac Sodium  4 g Topical QID  . insulin aspart  0-5 Units Subcutaneous QHS  . insulin aspart  0-9 Units Subcutaneous TID WC  . insulin glargine  10 Units Subcutaneous BID  . potassium chloride  40 mEq Oral BID   acetaminophen **OR** acetaminophen (TYLENOL) oral liquid 160 mg/5 mL **OR** acetaminophen, LORazepam  Assessment/ Plan:  75 y.o. 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)   Hypernatremia   Pressure injury of skin   Adrenal insufficiency, primary ([place])  Shaking [R25.1] CVA (cerebral vascular accident) ([place]) [M41.5] Embolic stroke ([place]) [A30.9] Cerebrovascular accident (CVA), unspecified mechanism ([place]) [I63.9]  1. Hypernatremia: with free water wasting and central diabetes insipidus. Calculated free water deficit of 6.4 liters Volume depleted on examination - current level 151 - DDAVP intranasaly 3mcg bid by primary team - continue dextrose infusion - hydrochlorothiazide stopped  2. Hypertension: blood pressure 108/77. Home regimen of lisinopril, hydrochlorothiazide, and metoprolol.  - BP controlled  3. Adrenal  insufficiency:  - hydrocortisone  4. Hypokalemia: potassium chloride.  - current level 3.1 - Potassium supplement ordered by primary team     LOS: [place] 3/14/202210:26 [place], [place]

## 2020-10-09 NOTE — Progress Notes (Signed)
PROGRESS NOTE    Cheyenne Anthony  QQV:[id number] DOB: Apr 17, 1946 DOA: 10/03/2020 PCP: [place]   Brief Narrative: Taken from H&P. Cheyenne Anthony is a 75 y.o. female with medical history significant for DM, HTN, lung cancer metastatic to the cerebellum, status post suboccipital craniotomy at [facility] on 08/02/2020 and discharged to skilled rehab, hospitalized from 1/22-2/22 with acute confusion, with extensive work-up including negative LP, negative EEG, negative for paraneoplastic syndrome/Lambert-Eaton syndrome, and ultimately found to be probably low cortisol level/adrenal crisis, treated with tapering steroids with improvement, who was sent to the emergency room after she was noted to have episodic shaking of the right arm and right face with otherwise no change from baseline.  MRI with a small left precentral gyrus acute infarct. Concern of seizures with acute infarct and intracranial metastatic disease s/p craniotomy. Neurology was consulted, she was loaded with Keppra followed by start of Keppra twice daily.  EEG done-was negative for any epileptiform activity but focal motor seizures cannot be excluded.  Worsening hypernatremia, very diluted polyuria, labs consistent with diabetes insipidus. Started her on desmopressin and nephrology was consulted. Increasing the dose of desmopressin and HCTZ was added today due to persistent hypernatremia, started improving with some decrease in urinary output now.  Subjective: Patient was resting comfortably when seen today.  Denies any pain.  Easily arousable and she was asking some help with the breakfast.  She appears dry.  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  Imaging positive for acute precentral gyrus CVA.  That can it self trigger seizures, right sided twitching is concerning for focal seizure-like activity.  Neurology was consulted-they are loaded her with Keppra and she was started on twice daily dose of Keppra. History of recent craniotomy secondary to cerebellar mets at [facility] in January 2022. CVA work-up with A1c of 7.4, lipid panel with LDL of 22 and HDL of 40, echocardiogram with normal EF and grade 1 diastolic dysfunction.  No other significant abnormality. EEG was negative for any specific epileptiform waves but focal motor seizures cannot be excluded.  Clinically seems improving. -Completed CVA work-up. -Continue with Keppra -Continue to monitor for any new neurologic deficit -Patient will go back to SNF  Hypernatremia/polyuria/diabetes insipidus.  Sodium with mild worsening, at 151 this morning. Urine osmolality very less as compared to serum.  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  Decreased urinary output to 1.7 from more than 6 L. HCTZ was discontinued yesterday along with decrease in D5 by nephrology. -Continue desmopressin to 20 mic twice daily. -Nephrology decided to hold HCTZ at this time -Increase D5 to 75 mL/h -Monitor sodium -Nephrology was consulted-appreciate their help.  Hypertension.  Blood pressure within goal. -Her home antihypertensives which include metoprolol, Zestoretic and nifedipine were held initially for permissive hypertension. -Can be restarted if needed .  Type 2 diabetes mellitus with renal manifestations.  Uncontrolled with A1c of 7.4 -Continue with SSI  History of adrenal crisis in 08/2020.  Repeat a.m. cortisol  was within normal limit. Patient completed the tapering dose of prednisone.  History of lung cancer, metastatic to  cerebellum status post craniotomy -No  acute issues -She will continue outpatient follow-up with oncology. -Patient is very high risk for deterioration and death. -Palliative care was consulted-had discussion with daughter and she wants everything needs to be done for her mother, tried explaining that approach might harm her more than benefit.  Patient will remain full code along with full scope of care at this time.  Right ankle pain.  Imaging was obtained which was negative for any acute fractures but did show some small bony densities, consistent with loose bodies. -Symptomatic management   Objective: Vitals:   10/09/20 0041 10/09/20 0504 10/09/20 0815 10/09/20 1221  BP: 106/78 101/65 107/83 108/77  Pulse: (!) 101 99 98 90  Resp: 20 18 15 15   Temp: 99.5 F (37.5 C) 99.6 F (37.6 C) 100 F (37.8 C) 98.8 F (37.1 C)  TempSrc: Oral Oral Oral Oral  SpO2: (!) 89% 93% 91% 95%  Weight:      Height:        Intake/Output Summary (Last 24 hours) at 10/09/2020 1352 Last data filed at 10/09/2020 1751 Gross per 24 hour  Intake 240 ml  Output 1300 ml  Net -1060 ml   Filed Weights   10/06/20 0500 10/07/20 0522 10/08/20 0538  Weight: 86.1 kg 87.4 kg 86 kg    Examination:  General.  Lethargic appearing elderly lady, in no acute distress. Pulmonary.  Lungs clear bilaterally, normal respiratory effort. CV.  Regular rate and rhythm, no JVD, rub or murmur. Abdomen.  Soft, nontender, nondistended, BS positive. CNS.  Alert and oriented x3.  No focal neurologic deficit. Extremities.  No edema, no cyanosis, pulses intact and symmetrical. Psychiatry.  Judgment and insight appears impaired.  DVT prophylaxis: Lovenox Code Status: Full Family Communication:  Daughter was updated on phone. Disposition Plan:  Status is: Inpatient  Remains inpatient appropriate because:Inpatient level of care appropriate due to severity of illness   Dispo: The patient is from: SNF              Anticipated d/c is to: SNF              Patient  currently is not medically stable to d/c.   Difficult to place patient No              Level of care: Med-Surg  All the records are reviewed and case discussed with Care Management/Social Worker. Management plans discussed with the patient, nursing and they are in agreement.  Consultants:   Neurology  Nephrology  Palliative care  Procedures:  Antimicrobials:   Data Reviewed: I have personally reviewed following labs and imaging studies  CBC: Recent Labs  Lab 10/03/20 1818 10/05/20 1007 10/06/20 0237 10/07/20 0514  WBC 8.4 9.3 9.7 9.5  NEUTROABS 4.5  --   --   --   HGB 10.4* 10.3* 10.5* 10.2*  HCT 35.7* 35.8* 36.8 34.5*  MCV 89.9 90.9 91.8 89.4  PLT 498* 507* 528* 025*   Basic Metabolic Panel: Recent Labs  Lab 10/06/20 1712 10/07/20 0514 10/07/20 1647 10/08/20 0437 10/09/20 0535  NA 158* 156* 151* 150* 151*  K 3.4* 3.1* 3.2* 2.9* 3.1*  CL 127* 123* 118* 116* 117*  CO2 24 26 26 29 28   GLUCOSE 119* 189* 161* 101* 103*  BUN 13 10 14 13 12   CREATININE 0.84 0.79 0.84 0.79 0.85  CALCIUM 9.3 9.3 9.2 9.0 8.8*  MG  --   --   --  2.4 2.4   GFR: Estimated  Creatinine Clearance: 61.6 mL/min (by C-G formula based on SCr of 0.85 mg/dL). Liver Function Tests: No results for input(s): AST, ALT, ALKPHOS, BILITOT, PROT, ALBUMIN in the last 168 hours. No results for input(s): LIPASE, AMYLASE in the last 168 hours. No results for input(s): AMMONIA in the last 168 hours. Coagulation Profile: Recent Labs  Lab 10/05/20 1007  INR 1.2   Cardiac Enzymes: No results for input(s): CKTOTAL, CKMB, CKMBINDEX, TROPONINI in the last 168 hours. BNP (last 3 results) No results for input(s): PROBNP in the last 8760 hours. HbA1C: No results for input(s): HGBA1C in the last 72 hours. CBG: Recent Labs  Lab 10/08/20 1148 10/08/20 1631 10/08/20 2000 10/09/20 0816 10/09/20 1222  GLUCAP 122* 145* 134* 115* 150*   Lipid Profile: No results for input(s): CHOL, HDL, LDLCALC, TRIG,  CHOLHDL, LDLDIRECT in the last 72 hours. Thyroid Function Tests: No results for input(s): TSH, T4TOTAL, FREET4, T3FREE, THYROIDAB in the last 72 hours. Anemia Panel: No results for input(s): VITAMINB12, FOLATE, FERRITIN, TIBC, IRON, RETICCTPCT in the last 72 hours. Sepsis Labs: No results for input(s): PROCALCITON, LATICACIDVEN in the last 168 hours.  Recent Results (from the past 240 hour(s))  SARS CORONAVIRUS 2 (TAT 6-24 HRS) Nasopharyngeal Nasopharyngeal Swab     Status: None   Collection Time: 10/03/20  7:56 PM   Specimen: Nasopharyngeal Swab  Result Value Ref Range Status   SARS Coronavirus 2 NEGATIVE NEGATIVE Final    Comment: (NOTE) SARS-CoV-2 target nucleic acids are NOT DETECTED.  The SARS-CoV-2 RNA is generally detectable in upper and lower respiratory specimens during the acute phase of infection. Negative results do not preclude SARS-CoV-2 infection, do not rule out co-infections with other pathogens, and should not be used as the sole basis for treatment or other patient management decisions. Negative results must be combined with clinical observations, patient history, and epidemiological information. The expected result is Negative.  Fact Sheet for Patients: SugarRoll.be  Fact Sheet for Healthcare Providers: https://www.woods-mathews.com/  This test is not yet approved or cleared by the [place] FDA and  has been authorized for detection and/or diagnosis of SARS-CoV-2 by FDA under an Emergency Use Authorization (EUA). This EUA will remain  in effect (meaning this test can be used) for the duration of the COVID-19 declaration under Se ction 564(b)(1) of the Act, 21 U.S.C. section 360bbb-3(b)(1), unless the authorization is terminated or revoked sooner.  Performed at [facility], [place] [address] [place],  [address]      Radiology Studies: No results found.  Scheduled Meds: .  stroke: mapping our  early stages of recovery book   Does not apply Once  . apixaban  5 mg Oral BID  . aspirin EC  81 mg Oral Daily  . atorvastatin  20 mg Oral Daily  . desmopressin  2 spray Nasal BID  . insulin aspart  0-5 Units Subcutaneous QHS  . insulin aspart  0-9 Units Subcutaneous TID WC  . insulin glargine  10 Units Subcutaneous BID  . potassium chloride  40 mEq Oral BID   Continuous Infusions: . dextrose 50 mL/hr at 10/09/20 0609  . levETIRAcetam 500 mg (10/09/20 1231)     LOS: 5 days   Time spent: 30 minutes. More than 50% of the time was spent in counseling/coordination of care  Lorella Nimrod, MD [facility]  If 7PM-7AM, please contact night-coverage [email or web address]  10/09/2020, 1:52 PM   This record has been created using Systems analyst. Errors have been sought  and corrected,but may not always be located. Such creation errors do not reflect on the standard of care.

## 2020-10-10 DIAGNOSIS — C3490 Malignant neoplasm of unspecified part of unspecified bronchus or lung: Secondary | ICD-10-CM | POA: Diagnosis not present

## 2020-10-10 DIAGNOSIS — E232 Diabetes insipidus: Secondary | ICD-10-CM | POA: Diagnosis not present

## 2020-10-10 LAB — CBC
HCT: 31 % — ABNORMAL LOW (ref 36.0–46.0)
Hemoglobin: 9.5 g/dL — ABNORMAL LOW (ref 12.0–15.0)
MCH: 26.5 pg (ref 26.0–34.0)
MCHC: 30.6 g/dL (ref 30.0–36.0)
MCV: 86.4 fL (ref 80.0–100.0)
Platelets: 361 10*3/uL (ref 150–400)
RBC: 3.59 MIL/uL — ABNORMAL LOW (ref 3.87–5.11)
RDW: 17.2 % — ABNORMAL HIGH (ref 11.5–15.5)
WBC: 7.3 10*3/uL (ref 4.0–10.5)
nRBC: 0.3 % — ABNORMAL HIGH (ref 0.0–0.2)

## 2020-10-10 LAB — BASIC METABOLIC PANEL
Anion gap: 8 (ref 5–15)
BUN: 13 mg/dL (ref 8–23)
CO2: 27 mmol/L (ref 22–32)
Calcium: 8.8 mg/dL — ABNORMAL LOW (ref 8.9–10.3)
Chloride: 113 mmol/L — ABNORMAL HIGH (ref 98–111)
Creatinine, Ser: 0.8 mg/dL (ref 0.44–1.00)
GFR, Estimated: >60 mL/min (ref >60)
Glucose, Bld: 140 mg/dL — ABNORMAL HIGH (ref 70–99)
Potassium: 3.1 mmol/L — ABNORMAL LOW (ref 3.5–5.1)
Sodium: 148 mmol/L — ABNORMAL HIGH (ref 135–145)

## 2020-10-10 LAB — GLUCOSE, CAPILLARY
Glucose-Capillary: 131 mg/dL — ABNORMAL HIGH (ref 70–99)
Glucose-Capillary: 199 mg/dL — ABNORMAL HIGH (ref 70–99)
Glucose-Capillary: 203 mg/dL — ABNORMAL HIGH (ref 70–99)
Glucose-Capillary: 202 mg/dL — ABNORMAL HIGH (ref 70–99)

## 2020-10-10 LAB — SODIUM: Sodium: 143 mmol/L (ref 135–145)

## 2020-10-10 LAB — NA AND K (SODIUM & POTASSIUM), RAND UR
Potassium Urine: 34 mmol/L
Sodium, Ur: 20 mmol/L

## 2020-10-10 LAB — CHLORIDE, URINE, RANDOM: Chloride Urine: 41 mmol/L

## 2020-10-10 LAB — CREATININE, URINE, RANDOM: Creatinine, Urine: 28 mg/dL

## 2020-10-10 MED ORDER — DEXTROSE 5 % IV SOLN: INTRAVENOUS | Status: DC

## 2020-10-10 NOTE — Progress Notes (Signed)
[place], [place] 10/10/20  Subjective:   LOS: 6  Cheyenne Anthony is a 75 y.o. 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 She presents to ED after reported shaking episode of right arm and face.   She is admitted for Acute CVA. She has been started on Aspirin and Keppra.  We have been consulted for hypernatremia. Sodium was 162 on admission. Currently receiving DDAVP and on dextrose IVF.   Patient resting in bed Alert and oriented x 2 Breakfast at bedside, has eaten some Denies nausea Denies shortness of breath  UOP-1L IVF-D5 @ 50 ml/hr   Objective:  Vital signs in last 24 hours:  Temp:  [97.4 F (36.3 C)-99.7 F (37.6 C)] 98.8 F (37.1 C) (03/15 0834) Pulse Rate:  [89-96] 93 (03/15 0834) Resp:  [15-19] 18 (03/15 0834) BP: (108-123)/(59-77) 120/71 (03/15 0834) SpO2:  [93 %-100 %] 100 % (03/15 0834)  Weight change:  Filed Weights   10/06/20 0500 10/07/20 0522 10/08/20 0538  Weight: 86.1 kg 87.4 kg 86 kg    Intake/Output:    Intake/Output Summary (Last 24 hours) at 10/10/2020 0950 Last data filed at 10/10/2020 0914 Gross per 24 hour  Intake 1560.67 ml  Output 1000 ml  Net 560.67 ml    Physical Exam: General: NAD, laying in bed  Head: Normocephalic, atraumatic. Dry oral mucosal membranes  Eyes: Anicteric  Neck: Supple, trachea midline  Lungs:  Clear to auscultation  Heart: Regular rate and rhythm  Abdomen:  Soft, nontender,   Extremities: no peripheral edema.  Neurologic: Alert, oriented to person place  Skin: No lesions    Basic Metabolic Panel:  Recent Labs  Lab 10/07/20 0514 10/07/20 1647 10/08/20 0437 10/09/20 0535 10/09/20 1751 10/10/20 0431  NA 156* 151* 150* 151* 142 148*  K 3.1* 3.2* 2.9* 3.1*  --  3.1*  CL 123* 118* 116* 117*  --  113*  CO2 26 26 29 28   --  27  GLUCOSE 189* 161* 101* 103*  --  140*  BUN 10 14 13 12   --  13  CREATININE 0.79  0.84 0.79 0.85  --  0.80  CALCIUM 9.3 9.2 9.0 8.8*  --  8.8*  MG  --   --  2.4 2.4  --   --      CBC: Recent Labs  Lab 10/03/20 1818 10/05/20 1007 10/06/20 0237 10/07/20 0514 10/10/20 0431  WBC 8.4 9.3 9.7 9.5 7.3  NEUTROABS 4.5  --   --   --   --   HGB 10.4* 10.3* 10.5* 10.2* 9.5*  HCT 35.7* 35.8* 36.8 34.5* 31.0*  MCV 89.9 90.9 91.8 89.4 86.4  PLT 498* 507* 528* 463* 361     No results found for: HEPBSAG, HEPBSAB, HEPBIGM    Microbiology:  Recent Results (from the past 240 hour(s))  SARS CORONAVIRUS 2 (TAT 6-24 HRS) Nasopharyngeal Nasopharyngeal Swab     Status: None   Collection Time: 10/03/20  7:56 PM   Specimen: Nasopharyngeal Swab  Result Value Ref Range Status   SARS Coronavirus 2 NEGATIVE NEGATIVE Final    Comment: (NOTE) SARS-CoV-2 target nucleic acids are NOT DETECTED.  The SARS-CoV-2 RNA is generally detectable in upper and lower respiratory specimens during the acute phase of infection. Negative results do not preclude SARS-CoV-2 infection, do not rule out co-infections with other pathogens, and should not be used as the sole basis for treatment or other  patient management decisions. Negative results must be combined with clinical observations, patient history, and epidemiological information. The expected result is Negative.  Fact Sheet for Patients: SugarRoll.be  Fact Sheet for Healthcare Providers: https://www.woods-mathews.com/  This test is not yet approved or cleared by the [place] FDA and  has been authorized for detection and/or diagnosis of SARS-CoV-2 by FDA under an Emergency Use Authorization (EUA). This EUA will remain  in effect (meaning this test can be used) for the duration of the COVID-19 declaration under Se ction 564(b)(1) of the Act, 21 U.S.C. section 360bbb-3(b)(1), unless the authorization is terminated or revoked sooner.  Performed at [facility], [place] [address],  [place], [place] [address]     Coagulation Studies: No results for input(s): LABPROT, INR in the last 72 hours.  Urinalysis: No results for input(s): COLORURINE, LABSPEC, PHURINE, GLUCOSEU, HGBUR, BILIRUBINUR, KETONESUR, PROTEINUR, UROBILINOGEN, NITRITE, LEUKOCYTESUR in the last 72 hours.  Invalid input(s): APPERANCEUR    Imaging: No results found.   Medications:   . dextrose 50 mL/hr at 10/10/20 0914  . levETIRAcetam Stopped (10/10/20 0026)   .  stroke: mapping our early stages of recovery book   Does not apply Once  . apixaban  5 mg Oral BID  . aspirin EC  81 mg Oral Daily  . atorvastatin  20 mg Oral Daily  . desmopressin  2 spray Nasal BID  . insulin aspart  0-5 Units Subcutaneous QHS  . insulin aspart  0-9 Units Subcutaneous TID WC  . insulin glargine  10 Units Subcutaneous BID  . latanoprost  1 drop Both Eyes QHS  . potassium chloride  40 mEq Oral BID   acetaminophen **OR** acetaminophen (TYLENOL) oral liquid 160 mg/5 mL **OR** acetaminophen, LORazepam  Assessment/ Plan:  75 y.o. 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 Hypernatremia: with free water wasting and central diabetes insipidus. Calculated free water deficit of 6.4 liters Volume depleted on examination - current level 148 - DDAVP intranasaly 19mcg bid  -  dextrose infusion 50 ml/hr - increased IVF to 75 ml/hr, but reduced based on rapid solium correction.  - Encouraged patient to  drink fluids  2. Hypertension: blood pressure 108/77. Home regimen of lisinopril, hydrochlorothiazide, and metoprolol.  - BP controlled  3. Adrenal insufficiency:  - hydrocortisone  4. Hypokalemia: potassium chloride.  - current level 3.1 - Potassium supplement ordered by primary team     LOS: [place] 3/15/20229:50 AM  [place], [place]

## 2020-10-10 NOTE — Progress Notes (Signed)
PROGRESS NOTE    Cheyenne Anthony  QIH:[id number] DOB: 15-Nov-1945 DOA: 10/03/2020 PCP: [facility]   Brief Narrative: Taken from H&P. Cheyenne Anthony is a 75 y.o. 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 on 08/02/2020 and discharged to skilled rehab, hospitalized from 1/22-2/22 with acute confusion, with extensive work-up including negative LP, negative EEG, negative for paraneoplastic syndrome/Lambert-Eaton syndrome, and ultimately found to be probably low cortisol level/adrenal crisis, treated with tapering steroids with improvement, who was sent to the emergency room after she was noted to have episodic shaking of the right arm and right face with otherwise no change from baseline.  MRI with a small left precentral gyrus acute infarct. Concern of seizures with acute infarct and intracranial metastatic disease s/p craniotomy. Neurology was consulted, she was loaded with Keppra followed by start of Keppra twice daily.  EEG done-was negative for any epileptiform activity but focal motor seizures cannot be excluded.  Worsening hypernatremia, very diluted polyuria, labs consistent with diabetes insipidus. Started her on desmopressin and nephrology was consulted. Urinary output improved, sodium improved once but then started going up after discontinuing D5 which was restarted at low-rate this morning.  Subjective: Patient was seen and examined.  No new complaint.  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  Imaging positive for acute precentral gyrus CVA.  That can it self trigger seizures, right sided twitching is concerning for focal seizure-like activity.  Neurology was consulted-they are loaded her with Keppra and she was started on twice daily dose of Keppra. History of recent craniotomy secondary to cerebellar mets at [facility] in January 2022. CVA work-up with A1c of 7.4, lipid panel with LDL of 22 and HDL of 40, echocardiogram with normal EF and grade 1 diastolic dysfunction.  No other significant abnormality. EEG was negative for any specific epileptiform waves but focal motor seizures cannot be excluded.  Clinically seems improving. -Completed CVA work-up. -Continue with Keppra -Continue to monitor for any new neurologic deficit -Patient will go back to SNF  Hypernatremia/polyuria/diabetes insipidus.  Sodium with mild worsening, at 148 this morning.  It was normalized yesterday evening and D5 was stopped.  Urine osmolality very less as compared to serum.  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  Decreased urinary output to 1.7 from more than 6 L. HCTZ was discontinued yesterday along with decrease in D5 by nephrology. -Continue desmopressin to 20 mic twice daily. -Nephrology decided to hold HCTZ at this time -Restart D5 at 50 mL/h -Encourage p.o. hydration -Monitor sodium -Nephrology was consulted-appreciate their help.  Hypertension.  Blood pressure within goal. -Her home antihypertensives which include metoprolol, Zestoretic and nifedipine were held initially for permissive hypertension. -Can be restarted if needed .  Type 2 diabetes mellitus with renal manifestations.  Uncontrolled with A1c of 7.4 -Continue with SSI  History of adrenal crisis in 08/2020.  Repeat a.m. cortisol  was within normal limit. Patient completed the tapering dose of prednisone.  History of lung cancer, metastatic to cerebellum status post craniotomy -No acute  issues -She will  continue outpatient follow-up with oncology. -Patient is very high risk for deterioration and death. -Palliative care was consulted-had discussion with daughter and she wants everything needs to be done for her mother, tried explaining that approach might harm her more than benefit.  Patient will remain full code along with full scope of care at this time. Daughter got very upset when I was trying to explain and demanding transfer to [facility].  Right ankle pain.  Imaging was obtained which was negative for any acute fractures but did show some small bony densities, consistent with loose bodies. -Symptomatic management   Objective: Vitals:   10/10/20 0046 10/10/20 0538 10/10/20 0834 10/10/20 1216  BP: 118/60 (!) 123/59 120/71 120/79  Pulse: 89 89 93 90  Resp: 19 19 18 16   Temp: 98.4 F (36.9 C) 99.7 F (37.6 C) 98.8 F (37.1 C) 98.4 F (36.9 C)  TempSrc: Oral Oral  Oral  SpO2: 97% 96% 100% 100%  Weight:      Height:        Intake/Output Summary (Last 24 hours) at 10/10/2020 1548 Last data filed at 10/10/2020 1416 Gross per 24 hour  Intake 1240.67 ml  Output 1200 ml  Net 40.67 ml   Filed Weights   10/06/20 0500 10/07/20 0522 10/08/20 0538  Weight: 86.1 kg 87.4 kg 86 kg    Examination:  General.  Chronically ill-appearing lady, in no acute distress. Pulmonary.  Lungs clear bilaterally, normal respiratory effort. CV.  Regular rate and rhythm, no JVD, rub or murmur. Abdomen.  Soft, nontender, nondistended, BS positive. CNS.  Alert .  No focal neurologic deficit. Extremities.  No edema, no cyanosis, pulses intact and symmetrical. Psychiatry.  Judgment and insight appears impaired.  DVT prophylaxis: Lovenox Code Status: Full Family Communication:  Daughter was updated on phone. Disposition Plan:  Status is: Inpatient  Remains inpatient appropriate because:Inpatient level of care appropriate due to severity of illness   Dispo: The patient is from: SNF               Anticipated d/c is to: SNF              Patient currently is not medically stable to d/c.   Difficult to place patient No              Level of care: Med-Surg  All the records are reviewed and case discussed with Care Management/Social Worker. Management plans discussed with the patient, nursing and they are in agreement.  Consultants:   Neurology  Nephrology  Palliative care  Procedures:  Antimicrobials:   Data Reviewed: I have personally reviewed following labs and imaging studies  CBC: Recent Labs  Lab 10/03/20 1818 10/05/20 1007 10/06/20 0237 10/07/20 0514 10/10/20 0431  WBC 8.4 9.3 9.7 9.5 7.3  NEUTROABS 4.5  --   --   --   --   HGB 10.4* 10.3* 10.5* 10.2* 9.5*  HCT 35.7* 35.8* 36.8 34.5* 31.0*  MCV 89.9 90.9 91.8 89.4 86.4  PLT 498* 507* 528* 463* 829   Basic Metabolic Panel: Recent Labs  Lab 10/07/20 0514 10/07/20 1647 10/08/20 0437 10/09/20 0535 10/09/20 1751 10/10/20 0431  NA 156* 151* 150* 151* 142 148*  K 3.1* 3.2* 2.9* 3.1*  --  3.1*  CL 123* 118* 116* 117*  --  113*  CO2 26 26 29 28   --  27  GLUCOSE 189* 161* 101* 103*  --  140*  BUN 10 14 13 12   --  13  CREATININE 0.79 0.84 0.79 0.85  --  0.80  CALCIUM 9.3 9.2 9.0 8.8*  --  8.8*  MG  --   --  2.4 2.4  --   --    GFR: Estimated Creatinine Clearance: 65.5 mL/min (by C-G formula based on SCr of 0.8 mg/dL). Liver Function Tests: No results for input(s): AST, ALT, ALKPHOS, BILITOT, PROT, ALBUMIN in the last 168 hours. No results for input(s): LIPASE, AMYLASE in the last 168 hours. No results for input(s): AMMONIA in the last 168 hours. Coagulation Profile: Recent Labs  Lab 10/05/20 1007  INR 1.2   Cardiac Enzymes: No results for input(s): CKTOTAL, CKMB, CKMBINDEX, TROPONINI in the last 168 hours. BNP (last 3 results) No results for input(s): PROBNP in the last 8760 hours. HbA1C: No results for input(s): HGBA1C in the last 72 hours. CBG: Recent Labs  Lab 10/09/20 1701 10/09/20 2117  10/10/20 0835 10/10/20 1236 10/10/20 1532  GLUCAP 138* 197* 131* 199* 203*   Lipid Profile: No results for input(s): CHOL, HDL, LDLCALC, TRIG, CHOLHDL, LDLDIRECT in the last 72 hours. Thyroid Function Tests: No results for input(s): TSH, T4TOTAL, FREET4, T3FREE, THYROIDAB in the last 72 hours. Anemia Panel: No results for input(s): VITAMINB12, FOLATE, FERRITIN, TIBC, IRON, RETICCTPCT in the last 72 hours. Sepsis Labs: No results for input(s): PROCALCITON, LATICACIDVEN in the last 168 hours.  Recent Results (from the past 240 hour(s))  SARS CORONAVIRUS 2 (TAT 6-24 HRS) Nasopharyngeal Nasopharyngeal Swab     Status: None   Collection Time: 10/03/20  7:56 PM   Specimen: Nasopharyngeal Swab  Result Value Ref Range Status   SARS Coronavirus 2 NEGATIVE NEGATIVE Final    Comment: (NOTE) SARS-CoV-2 target nucleic acids are NOT DETECTED.  The SARS-CoV-2 RNA is generally detectable in upper and lower respiratory specimens during the acute phase of infection. Negative results do not preclude SARS-CoV-2 infection, do not rule out co-infections with other pathogens, and should not be used as the sole basis for treatment or other patient management decisions. Negative results must be combined with clinical observations, patient history, and epidemiological information. The expected result is Negative.  Fact Sheet for Patients: SugarRoll.be  Fact Sheet for Healthcare Providers: https://www.woods-mathews.com/  This test is not yet approved or cleared by the [place] FDA and  has been authorized for detection and/or diagnosis of SARS-CoV-2 by FDA under an Emergency Use Authorization (EUA). This EUA will remain  in effect (meaning this test can be used) for the duration of the COVID-19 declaration under Se ction 564(b)(1) of the Act, 21 U.S.C. section 360bbb-3(b)(1), unless the authorization is terminated or revoked sooner.  Performed at [facility], [place] [address], [place], [place] [address]      Radiology Studies: No results found.  Scheduled Meds: .  stroke: mapping our early stages of recovery book   Does not apply Once  . apixaban  5 mg Oral BID  . aspirin EC  81 mg Oral Daily  . atorvastatin  20 mg Oral Daily  . desmopressin  2 spray Nasal BID  . insulin aspart  0-5 Units Subcutaneous QHS  . insulin aspart  0-9 Units Subcutaneous TID WC  . insulin glargine  10 Units Subcutaneous BID  . latanoprost  1 drop Both Eyes QHS  . potassium chloride  40 mEq Oral BID   Continuous Infusions: . dextrose 50 mL/hr at 10/10/20 0914  . levETIRAcetam 500 mg (10/10/20 1245)     LOS: 6 days  Time spent: 30 minutes. More than 50% of the time was spent in counseling/coordination of care  Lorella Nimrod, MD [facility]  If 7PM-7AM, please contact night-coverage [email or web address]  10/10/2020, 3:48 PM   This record has been created using Systems analyst. Errors have been sought and corrected,but may not always be located. Such creation errors do not reflect on the standard of care.

## 2020-10-11 DIAGNOSIS — C3490 Malignant neoplasm of unspecified part of unspecified bronchus or lung: Secondary | ICD-10-CM

## 2020-10-11 DIAGNOSIS — I2699 Other pulmonary embolism without acute cor pulmonale: Secondary | ICD-10-CM

## 2020-10-11 DIAGNOSIS — I82409 Acute embolism and thrombosis of unspecified deep veins of unspecified lower extremity: Secondary | ICD-10-CM

## 2020-10-11 DIAGNOSIS — I639 Cerebral infarction, unspecified: Secondary | ICD-10-CM

## 2020-10-11 DIAGNOSIS — E870 Hyperosmolality and hypernatremia: Secondary | ICD-10-CM | POA: Diagnosis not present

## 2020-10-11 DIAGNOSIS — Z515 Encounter for palliative care: Secondary | ICD-10-CM

## 2020-10-11 DIAGNOSIS — C7931 Secondary malignant neoplasm of brain: Secondary | ICD-10-CM

## 2020-10-11 DIAGNOSIS — N2889 Other specified disorders of kidney and ureter: Secondary | ICD-10-CM

## 2020-10-11 DIAGNOSIS — M25579 Pain in unspecified ankle and joints of unspecified foot: Secondary | ICD-10-CM

## 2020-10-11 LAB — BASIC METABOLIC PANEL
Anion gap: 8 (ref 5–15)
BUN: 10 mg/dL (ref 8–23)
CO2: 26 mmol/L (ref 22–32)
Calcium: 8.7 mg/dL — ABNORMAL LOW (ref 8.9–10.3)
Chloride: 114 mmol/L — ABNORMAL HIGH (ref 98–111)
Creatinine, Ser: 0.79 mg/dL (ref 0.44–1.00)
GFR, Estimated: >60 mL/min (ref >60)
Glucose, Bld: 143 mg/dL — ABNORMAL HIGH (ref 70–99)
Potassium: 3.6 mmol/L (ref 3.5–5.1)
Sodium: 148 mmol/L — ABNORMAL HIGH (ref 135–145)

## 2020-10-11 LAB — GLUCOSE, CAPILLARY
Glucose-Capillary: 127 mg/dL — ABNORMAL HIGH (ref 70–99)
Glucose-Capillary: 173 mg/dL — ABNORMAL HIGH (ref 70–99)
Glucose-Capillary: 134 mg/dL — ABNORMAL HIGH (ref 70–99)
Glucose-Capillary: 217 mg/dL — ABNORMAL HIGH (ref 70–99)

## 2020-10-11 MED ORDER — HYDROCORTISONE 5 MG PO TABS
5.0000 mg | ORAL_TABLET | Freq: Every day | ORAL | Status: DC
  Administered 2020-10-11 – 2020-10-16 (×5): 5 mg via ORAL
  Filled 2020-10-11 (×8): qty 1

## 2020-10-11 MED ORDER — HYDROCORTISONE 10 MG PO TABS
10.0000 mg | ORAL_TABLET | Freq: Every morning | ORAL | Status: DC
  Administered 2020-10-12 – 2020-10-16 (×5): 10 mg via ORAL
  Filled 2020-10-11 (×6): qty 1

## 2020-10-11 NOTE — NC FL2 (Signed)
[place] LEVEL OF CARE SCREENING TOOL     IDENTIFICATION  Patient Name: Cheyenne Anthony Birthdate: Jan 01, 1946 Sex: female Admission Date (Current Location): 10/03/2020  [place] and [place] Number:  Engineering geologist and Address:  [facility], [address], [place], [place] [address]      Provider Number: [id number]  Attending Physician Name and Address:  Dwyane Dee, MD  Relative Name and Phone Number:  Elise Benne (daughter) [phone]    Current Level of Care: Hospital Recommended Level of Care: [place] Prior Approval Number:    Date Approved/Denied:   PASRR Number: [id number]  Discharge Plan: SNF    Current Diagnoses: Patient Active Problem List   Diagnosis Date Noted  . Adrenal insufficiency, primary ([place]) 10/05/2020  . Pressure injury of skin 10/04/2020  . Episode of shaking 10/03/2020  . CVA (cerebral vascular accident) ([place]) 10/03/2020  . Hypernatremia 10/03/2020  . Glaucoma 08/21/2020  . Obesity (BMI 30-39.9) 08/21/2020  . Low serum cortisol level ([place]) 08/21/2020  . S/P craniotomy 08/20/2020  . 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 Possible Seizure ([place]) 08/20/2020  . 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 Cerebellar mass   . Lung mass   . Hypokalemia 07/31/2020  . Acute metabolic encephalopathy 85/46/2703  . Abnormal CXR with 5 cm round opacity in RML 07/31/2020  . LBBB (left bundle branch block) 07/31/2020  . Elevated troponin 07/31/2020  . HLD (hyperlipidemia) 07/31/2020  . Depression 07/31/2020  . Hypertension   . Type II diabetes mellitus with renal manifestations (HCC)     Orientation RESPIRATION BLADDER Height & Weight     Self  Normal Incontinent Weight: 86 kg Height:  5\' 4"  (162.6 cm)  BEHAVIORAL SYMPTOMS/MOOD NEUROLOGICAL BOWEL NUTRITION STATUS      Incontinent Diet (Dysphagia diet 2)  AMBULATORY STATUS COMMUNICATION  OF NEEDS Skin   Total Care   Normal                       Personal Care Assistance Level of Assistance  Bathing,Feeding,Dressing,Total care           Functional Limitations Info  Sight Sight Info: Impaired        SPECIAL CARE FACTORS FREQUENCY                       Contractures Contractures Info: Not present    Additional Factors Info  Code Status,Allergies Code Status Info: Full Allergies Info: NKA           Current Medications (10/11/2020):  This is the current hospital active medication list Current Facility-Administered Medications  Medication Dose Route Frequency Provider Last Rate Last Admin  .  stroke: mapping our early stages of recovery book   Does not apply Once Athena Masse, MD      . acetaminophen (TYLENOL) tablet 650 mg  650 mg Oral Q4H PRN Athena Masse, MD   650 mg at 10/11/20 0817   Or  . acetaminophen (TYLENOL) 160 MG/5ML solution 650 mg  650 mg Per Tube Q4H PRN Athena Masse, MD       Or  . acetaminophen (TYLENOL) suppository 650 mg  650 mg Rectal Q4H PRN Athena Masse, MD      . apixaban Arne Cleveland) tablet 5 mg  5 mg Oral BID Oswald Hillock, RPH   5 mg at 10/11/20 5009  .  aspirin EC tablet 81 mg  81 mg Oral Daily Rosalin Hawking, MD   81 mg at 10/11/20 0818  . atorvastatin (LIPITOR) tablet 20 mg  20 mg Oral Daily Rosalin Hawking, MD   20 mg at 10/11/20 0818  . desmopressin (DDAVP) 0.01 % (10 mcg/activation) spray 2 spray  2 spray Nasal BID Lorella Nimrod, MD   2 spray at 10/11/20 0820  . dextrose 5 % solution   Intravenous Continuous Murlean Iba, MD 50 mL/hr at 10/10/20 1727 Infusion Verify at 10/10/20 1727  . insulin aspart (novoLOG) injection 0-5 Units  0-5 Units Subcutaneous QHS Lorella Nimrod, MD   2 Units at 10/10/20 2206  . insulin aspart (novoLOG) injection 0-9 Units  0-9 Units Subcutaneous TID WC Lorella Nimrod, MD   1 Units at 10/11/20 0821  . insulin glargine (LANTUS) injection 10 Units  10 Units Subcutaneous BID Lorella Nimrod,  MD   10 Units at 10/11/20 [phone]  . latanoprost (XALATAN) 0.005 % ophthalmic solution 1 drop  1 drop Both Eyes QHS Lorella Nimrod, MD   1 drop at 10/10/20 2205  . levETIRAcetam (KEPPRA) IVPB 500 mg/100 mL premix  500 mg Intravenous Q12H Rosalin Hawking, MD 400 mL/hr at 10/11/20 0050 500 mg at 10/11/20 0050  . LORazepam (ATIVAN) injection 1-2 mg  1-2 mg Intravenous Q2H PRN Judd Gaudier V, MD      . potassium chloride SA (KLOR-CON) CR tablet 40 mEq  40 mEq Oral BID Lorella Nimrod, MD   40 mEq at 10/11/20 8616     Discharge Medications: Please see discharge summary for a list of discharge medications.  Relevant Imaging Results:  Relevant Lab Results:   Additional Information SS# [id number]  Shelbie Hutching, RN

## 2020-10-11 NOTE — TOC Initial Note (Signed)
Transition of Care [facility]) - Initial/Assessment Note    Patient Details  Name: Cheyenne Anthony MRN: [id number] Date of Birth: [birth year]  Transition of Care [facility] CM/SW Contact:    Shelbie Hutching, RN Phone Number: 10/11/2020, 10:12 AM  Clinical Narrative:                 Patient admitted to the hospital with acute/subacute CVA.  Patient has a history of metastatic lung cancer with mets to cerebellum S/P craniotomy at [facility].  Patient is currently at [facility] for rehab, plan is for patient to return to [facility] when Medically stable.  Patient is part of the [facility] program, [facility] is aware of plan.  RNCM will update [facility] when patient is medically ready for discharge.    Expected Discharge Plan: Skilled Nursing Facility Barriers to Discharge: Continued Medical Work up   Patient Goals and CMS Choice Patient states their goals for this hospitalization and ongoing recovery are:: Patient will return to [facility], ultimate family goal is for patient to be able to return home at some point CMS Medicare.gov Compare Post Acute Care list provided to:: Patient Represenative (must comment) Choice offered to / list presented to : Adult Children  Expected Discharge Plan and Services Expected Discharge Plan: [place]   Discharge Planning Services: CM Consult Post Acute Care Choice: [place] Living arrangements for the past 2 months: [place]                 DME Arranged: N/A DME Agency: NA       HH Arranged: NA          Prior Living Arrangements/Services Living arrangements for the past 2 months: [place] Lives with:: Facility Resident Patient language and need for interpreter reviewed:: Yes Do you feel safe going back to the place where you live?: Yes      Need for Family Participation in Patient Care: Yes (Comment) Care giver support system in place?: Yes (comment) (daughters and [place])   Criminal Activity/Legal Involvement Pertinent  to Current Situation/Hospitalization: No - Comment as needed  Activities of Daily Living   ADL Screening (condition at time of admission) Patient's cognitive ability adequate to safely complete daily activities?: No Is the patient deaf or have difficulty hearing?: No Does the patient have difficulty seeing, even when wearing glasses/contacts?: No Does the patient have difficulty concentrating, remembering, or making decisions?: No Patient able to express need for assistance with ADLs?: Yes Does the patient have difficulty dressing or bathing?: Yes Independently performs ADLs?: No Communication: Independent Dressing (OT): Needs assistance Is this a change from baseline?: Pre-admission baseline Does the patient have difficulty walking or climbing stairs?: Yes  Permission Sought/Granted Permission sought to share information with : Case Manager,Facility Contact Representative,Family Supports Permission granted to share information with : Yes, Verbal Permission Granted  Share Information with NAME: Elise Benne  Permission granted to share info w AGENCY: [facility]  Permission granted to share info w Relationship: daughter     Emotional Assessment   Attitude/Demeanor/Rapport: Unable to Assess Affect (typically observed): Unable to Assess Orientation: : Oriented to Self Alcohol / Substance Use: Not Applicable Psych Involvement: No (comment)  Admission diagnosis:  Shaking [R25.1] CVA (cerebral vascular accident) ([place]) [Z85.8] Embolic stroke ([place]) [I50.2] Cerebrovascular accident (CVA), unspecified mechanism ([place]) [I63.9] Patient Active Problem List   Diagnosis Date Noted  . Adrenal insufficiency, primary ([place]) 10/05/2020  . Pressure injury of skin 10/04/2020  . Episode of  shaking 10/03/2020  . CVA (cerebral vascular accident) ([place]) 10/03/2020  . Hypernatremia 10/03/2020  . Glaucoma 08/21/2020  . Obesity (BMI 30-39.9) 08/21/2020  . Low serum cortisol level ([place])  08/21/2020  . S/P craniotomy 08/20/2020  . 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 Possible Seizure ([place]) 08/20/2020  . 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 Cerebellar mass   . Lung mass   . Hypokalemia 07/31/2020  . Acute metabolic encephalopathy 37/48/2707  . Abnormal CXR with 5 cm round opacity in RML 07/31/2020  . LBBB (left bundle branch block) 07/31/2020  . Elevated troponin 07/31/2020  . HLD (hyperlipidemia) 07/31/2020  . Depression 07/31/2020  . Hypertension   . Type II diabetes mellitus with renal manifestations [facility])    PCP:  [facility]:   [place] [address] [place] [address] Phone: [phone] Fax: [phone]     Social Determinants of Health (SDOH) Interventions    Readmission Risk Interventions Readmission Risk Prevention Plan 10/11/2020  Transportation Screening Complete  PCP or Specialist Appt within 3-5 Days Complete  HRI or [place] Complete  Social Work Consult for [place] Planning/Counseling Complete  Palliative Care Screening Complete  Medication Review Press photographer) Complete  Some recent data might be hidden

## 2020-10-11 NOTE — Assessment & Plan Note (Addendum)
-   continue hydrocortisone; discharge summary from [facility] reviewed

## 2020-10-11 NOTE — Progress Notes (Addendum)
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 She was sent to the ER this hospitalization after an episode of shaking in her right arm and right face. She underwent MRI brain which showed an acute left precentral gyrus infarct.  There was also concern for seizure activity given her shaking episode and acute CVA with history of metastatic disease s/p craniotomy.  Neurology was consulted and she was started on Keppra in the ER.  EEG was negative for epileptiform activity but focal motor seizures could not be excluded. She was also found to have worsening hypernatremia and deluded polyuria.  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  She was started on D5W and DDAVP.  Nephrology was also consulted.   Interval History:  No events overnight.  Resting in bed very lethargic/somnolent this morning.  Did arouse enough to tell me her name and was able to follow commands but then fell back asleep.  ROS: Review of systems not obtained due to patient factors.  Cognitive impairment  Assessment & Plan: * Acute/ subacute CVA  on CT head - MRI brain noted with acute precentral gyrus CVA.   - started on Keppra per neurology  - history of craniotomy due to cerebellar mets.  Performed at [facility] in January 2022 - CVA work-up with A1c of 7.4, lipid panel with LDL of 22 and HDL of 40, echocardiogram with normal EF and grade 1 diastolic dysfunction.  No other  significant abnormality. EEG was negative for any specific epileptiform waves but focal motor seizures cannot be excluded. 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 abnormalities - continue D5W and DDAVP until Na stable and oral intake more consistent  - nephrology also following   Pulmonary emboli ([place]) - diagnosed at [facility] on CTA on 09/14/20; also has left popliteal DVT (near occlusive) on 09/15/20 duplex -Etiology considered due to hypercoagulable state in setting of active malignancy and prolonged immobility -She was considered reasonable candidate for Eliquis and was evaluated by neurosurgery as well (low risk for brain hemorrhage given time from surgery) -Continue Eliquis  Adrenal insufficiency, primary ([place]) - continue hydrocortisone; discharge summary from [facility] reviewed  DVT (deep venous thrombosis) ([place]) - see PE - continue Eliquis   Renal mass - per [facility] d/c summary: "CT with 1.2 cm left renal mass, recommend MRI for further characterization, however unclear if this would be informative in setting of known metastatic NSCLC. Patient to follow this up as outpatient."  Ankle pain - xray negative for acute fractures  - symptomatic management   Pressure ulcer of coccygeal region, stage 3 ([place]) -Evaluated by [place] RN, appreciate assistance -Continue dressing changes per WOCN RN: Cleanse coccyx wound with NS and apply piece of calcium alginate to wound bed.  Sacral foam dressing to coccyx area.  Change every 3 days and as needed for soiling  Lung cancer with cerebellar metastases s/p  craniotomy 08/02/20 at [facility] -She will continue outpatient follow-up with oncology. -Patient is high risk for further decline  - PCM has spoke with family; currently GOC are to continue full measures at this time  Type II diabetes mellitus with renal manifestations ([place]) - continue SSI and CBG monitoring    Hypertension - stable off meds at this time   Episode of shaking-resolved as of 10/11/2020 - see CVA - continue keppra   Old records reviewed in assessment of this patient  Antimicrobials: n/a  DVT prophylaxis:  apixaban (ELIQUIS) tablet 5 mg   Code Status:   Code Status: Full Code Family Communication:   Disposition Plan: Status is: Inpatient  Remains inpatient appropriate because:Altered mental status, Unsafe d/c plan and Inpatient level of care appropriate due to severity of illness   Dispo: The patient is from: SNF              Anticipated d/c is to: SNF              Patient currently is not medically stable to d/c.   Difficult to place patient No      Risk of unplanned readmission score: Unplanned Admission- Pilot do not use: 27.6   Objective: Blood pressure 122/78, pulse 99, temperature (!) 97.4 F (36.3 C), temperature source Oral, resp. rate 16, height 5\' 4"  (1.626 m), weight 86 kg, SpO2 99 %.  Examination: General appearance: Chronically ill-appearing elderly woman laying in bed in no obvious distress Head: Normocephalic, without obvious abnormality, atraumatic Eyes: EOMI, PERRL Lungs: clear to auscultation bilaterally Heart: regular rate and rhythm and S1, S2 normal Abdomen: normal findings: bowel sounds normal and soft, non-tender Extremities: no edema Skin: mobility and turgor normal Neurologic: weak throughout; oriented to name; moves all 4 extremities to command  Consultants:   Nephrology  Neurology   Procedures:   EEG  Data Reviewed: I have personally reviewed following labs and imaging studies Results for orders placed or performed during the hospital encounter of 10/03/20 (from the past 24 hour(s))  Glucose, capillary     Status: Abnormal   Collection Time: 10/10/20  3:32 PM  Result Value Ref Range   Glucose-Capillary 203 (H) 70 - 99 mg/dL  Sodium     Status: None   Collection Time: 10/10/20  6:00 PM  Result Value Ref Range    Sodium 143 135 - 145 mmol/L  Glucose, capillary     Status: Abnormal   Collection Time: 10/10/20  8:24 PM  Result Value Ref Range   Glucose-Capillary 202 (H) 70 - 99 mg/dL  Basic metabolic panel     Status: Abnormal   Collection Time: 10/11/20  6:19 AM  Result Value Ref Range   Sodium 148 (H) 135 - 145 mmol/L   Potassium 3.6 3.5 - 5.1 mmol/L   Chloride 114 (H) 98 - 111 mmol/L   CO2 26 22 - 32 mmol/L   Glucose, Bld 143 (H) 70 - 99 mg/dL   BUN 10 8 - 23 mg/dL   Creatinine, Ser 0.79 0.44 - 1.00 mg/dL   Calcium 8.7 (L) 8.9 - 10.3 mg/dL   GFR, Estimated >60 >60 mL/min   Anion gap 8 5 - 15  Glucose, capillary     Status: Abnormal   Collection Time: 10/11/20  8:03 AM  Result Value Ref Range   Glucose-Capillary 127 (H) 70 - 99 mg/dL  Glucose, capillary     Status: Abnormal   Collection Time: 10/11/20 11:56 AM  Result Value Ref Range  Glucose-Capillary 173 (H) 70 - 99 mg/dL    Recent Results (from the past 240 hour(s))  SARS CORONAVIRUS 2 (TAT 6-24 HRS) Nasopharyngeal Nasopharyngeal Swab     Status: None   Collection Time: 10/03/20  7:56 PM   Specimen: Nasopharyngeal Swab  Result Value Ref Range Status   SARS Coronavirus 2 NEGATIVE NEGATIVE Final    Comment: (NOTE) SARS-CoV-2 target nucleic acids are NOT DETECTED.  The SARS-CoV-2 RNA is generally detectable in upper and lower respiratory specimens during the acute phase of infection. Negative results do not preclude SARS-CoV-2 infection, do not rule out co-infections with other pathogens, and should not be used as the sole basis for treatment or other patient management decisions. Negative results must be combined with clinical observations, patient history, and epidemiological information. The expected result is Negative.  Fact Sheet for Patients: SugarRoll.be  Fact Sheet for Healthcare Providers: https://www.woods-mathews.com/  This test is not yet approved or cleared by the [place] FDA and  has been authorized for detection and/or diagnosis of SARS-CoV-2 by FDA under an Emergency Use Authorization (EUA). This EUA will remain  in effect (meaning this test can be used) for the duration of the COVID-19 declaration under Se ction 564(b)(1) of the Act, 21 U.S.C. section 360bbb-3(b)(1), unless the authorization is terminated or revoked sooner.  Performed at [facility], [place] [address], [place], [place] [address]      Radiology Studies: No results found. DG Ankle Complete Right  Final Result    US Venous Img Lower Bilateral (DVT)  Final Result    CT ANGIOGRAM HEAD NECK W WO CONTRAST  Final Result    MR BRAIN WO CONTRAST  Final Result    CT Head Wo Contrast  Final Result      Scheduled Meds: .  stroke: mapping our early stages of recovery book   Does not apply Once  . apixaban  5 mg Oral BID  . aspirin EC  81 mg Oral Daily  . atorvastatin  20 mg Oral Daily  . desmopressin  2 spray Nasal BID  . [START ON 10/12/2020] hydrocortisone  10 mg Oral q AM   And  . hydrocortisone  5 mg Oral Q1500  . insulin aspart  0-5 Units Subcutaneous QHS  . insulin aspart  0-9 Units Subcutaneous TID WC  . insulin glargine  10 Units Subcutaneous BID  . latanoprost  1 drop Both Eyes QHS  . potassium chloride  40 mEq Oral BID   PRN Meds: acetaminophen **OR** acetaminophen (TYLENOL) oral liquid 160 mg/5 mL **OR** acetaminophen, LORazepam Continuous Infusions: . dextrose 50 mL/hr at 10/11/20 0945  . levETIRAcetam 500 mg (10/11/20 1211)     LOS: 7 days  Time spent: Greater than 50% of the 35 minute visit was spent in counseling/coordination of care for the patient as laid out in the A&P.   Dwyane Dee, MD [facility] 10/11/2020, 1:57 PM

## 2020-10-11 NOTE — Progress Notes (Signed)
Occupational Therapy Treatment Patient Details Name: Cheyenne Anthony MRN: [id number] DOB: 02-Nov-1945 Today's Date: 10/11/2020    History of present illness 75 y.o. female with medical history significant for DM, HTN, lung cancer metastatic to the cerebellum, status post suboccipital craniotomy at [facility] on 08/02/2020 and discharged to skilled rehab, hospitalized from 1/22-2/22 with acute confusion, with extensive work-up including negative LP, negative EEG, negative for paraneoplastic syndrome/Lambert-Eaton syndrome, and ultimately found to be probably low cortisol level/adrenal crisis, treated with tapering steroids with improvement. Pt was residing at [facility] and was sent to the emergency room after she was noted to have episodic shaking of the right arm and right face. 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 Per Neurology, EEG pending   OT comments  Cheyenne Anthony was seen for OT treatment on this date. Upon arrival to room pt reclined in bed, breakfast eaten, agreeable to tx. Pt required MAX A don B socks at bed level. MAX A self drinking seated EOB - assist for sitting balance and steadying cup. Pt tolerated ~15 mins sitting EOB - completed seated exercises as described below and requiring MOD A to maintain sitting balance. Upon return to supine pt reports having BM - MAX A x2 for toileting at bed level c L+R rolling. SETUP lotion for hands/face and don/doff glasses at bed level. Pt making good progress toward goals. Pt continues to benefit from skilled OT services to maximize return to PLOF and minimize risk of future falls, injury, caregiver burden, and readmission. Will continue to follow POC. Discharge recommendation remains appropriate.    Follow Up Recommendations  SNF    Equipment Recommendations  Other (comment) (TBD at next venue of care)    Recommendations for Other Services      Precautions / Restrictions Precautions Precautions: Fall;Other (comment) (aspiration,  seizure) Restrictions Weight Bearing Restrictions: No       Mobility Bed Mobility Overal bed mobility: Needs Assistance Bed Mobility: Supine to Sit;Sit to Supine     Supine to sit: Max assist;+2 for physical assistance Sit to supine: Max assist;+2 for physical assistance        Transfers                 General transfer comment: not tested 2/2 poor sitting balance    Balance Overall balance assessment: Needs assistance Sitting-balance support: Bilateral upper extremity supported;Feet unsupported Sitting balance-Leahy Scale: Poor Sitting balance - Comments: Pt tolerated 10 mins sitting EOB requiring MOD A Postural control: Right lateral lean                                 ADL either performed or assessed with clinical judgement   ADL Overall ADL's : Needs assistance/impaired                                       General ADL Comments: MAX A don B socks at bed level. MAX A self drinking seated EOB - assist for sittin gblanace and steadying cup. MAX A x2 for toileting at bed level c L+R rolling. SETUP lotion for hands/face and don/doff glasses at bed level               Cognition Arousal/Alertness: Awake/alert Behavior During Therapy: Flat affect Overall Cognitive Status: No family/caregiver present to determine baseline cognitive functioning  General Comments: Follows one step commands and answers questions apporpriately        Exercises Exercises: Other exercises;General Lower Extremity General Exercises - Lower Extremity Ankle Circles/Pumps: AROM;Strengthening;Both;10 reps;Seated Short Arc Quad: Strengthening;Both;10 reps;AAROM;Supine Other Exercises Other Exercises: Pt educated re: OT role, DME recs, d/c recs, falls prevention, ECS, importance of mobility for funcitonal strengthening Other Exercises: LBD, toileting, self-drinking, grooming, sup<>sit, sitting  balance/tolerance           Pertinent Vitals/ Pain       Pain Assessment: Faces Faces Pain Scale: Hurts little more Pain Location: B knees with PROM Pain Descriptors / Indicators: Aching Pain Intervention(s): Limited activity within patient's tolerance;Repositioned         Frequency  Min 1X/week        Progress Toward Goals  OT Goals(current goals can now be found in the care plan section)  Progress towards OT goals: Progressing toward goals  Acute Rehab OT Goals Patient Stated Goal: none stated OT Goal Formulation: With patient Time For Goal Achievement: 10/18/20 Potential to Achieve Goals: Good ADL Goals Pt Will Perform Grooming: with min assist;sitting Pt Will Perform Upper Body Dressing: with min assist;sitting Pt Will Perform Lower Body Dressing: with mod assist;sitting/lateral leans  Plan Discharge plan remains appropriate;Frequency remains appropriate       AM-PAC OT "6 Clicks" Daily Activity     Outcome Measure   Help from another person eating meals?: A Little Help from another person taking care of personal grooming?: A Little Help from another person toileting, which includes using toliet, bedpan, or urinal?: A Lot Help from another person bathing (including washing, rinsing, drying)?: A Lot Help from another person to put on and taking off regular upper body clothing?: A Lot Help from another person to put on and taking off regular lower body clothing?: A Lot 6 Click Score: 14    End of Session    OT Visit Diagnosis: Muscle weakness (generalized) (M62.81);Other symptoms and signs involving cognitive function   Activity Tolerance Patient tolerated treatment well   Patient Left in bed;with call bell/phone within reach;with bed alarm set   Nurse Communication Mobility status        Time: 0919-1000 OT Time Calculation (min): 41 min  Charges: OT General Charges $OT Visit: 1 Visit OT Treatments $Self Care/Home Management : 23-37  mins $Therapeutic Activity: 8-22 mins  Dessie Coma, M.S. OTR/L  10/11/20, 10:37 AM  ascom [phone]

## 2020-10-11 NOTE — Assessment & Plan Note (Signed)
-   see PE - continue Eliquis

## 2020-10-11 NOTE — Assessment & Plan Note (Addendum)
-   diagnosed at [facility] on CTA on 09/14/20; also has left popliteal DVT (near occlusive) on 09/15/20 duplex -Etiology considered due to hypercoagulable state in setting of active malignancy and prolonged immobility -She was considered reasonable candidate for Eliquis and was evaluated by neurosurgery as well (low risk for brain hemorrhage given time from surgery) -Continue Eliquis

## 2020-10-11 NOTE — Assessment & Plan Note (Signed)
-   xray negative for acute fractures  - symptomatic management

## 2020-10-11 NOTE — Plan of Care (Signed)
  Problem: Education: Goal: Knowledge of disease or condition will improve Outcome: Progressing Goal: Knowledge of secondary prevention will improve Outcome: Progressing Goal: Knowledge of patient specific risk factors addressed and post discharge goals established will improve Outcome: Progressing

## 2020-10-11 NOTE — Assessment & Plan Note (Signed)
-   per [facility] d/c summary: "CT with 1.2 cm left renal mass, recommend MRI for further characterization, however unclear if this would be informative in setting of known metastatic NSCLC. Patient to follow this up as outpatient."

## 2020-10-11 NOTE — Assessment & Plan Note (Signed)
-  She will continue outpatient follow-up with oncology. -Patient is high risk for further decline  - PCM has spoke with family; currently GOC are to continue full measures at this time

## 2020-10-11 NOTE — Assessment & Plan Note (Signed)
-   continue SSI and CBG monitoring

## 2020-10-11 NOTE — Progress Notes (Signed)
Palliative: Case discussed with transition of care team.  Palliative consult complete, goals are set.  No additional needs at this time.  Cheyenne Anthony is active with [facility].   PMT to shadow.  No charge [name], NP Palliative medicine team Greater than 50% of this time was spent counseling and coordinating care related to the above assessment and plan.

## 2020-10-11 NOTE — Assessment & Plan Note (Addendum)
-   MRI brain noted with acute precentral gyrus CVA.   - started on Keppra per neurology  - history of craniotomy due to cerebellar mets.  Performed at [facility] in January 2022 - CVA work-up with A1c of 7.4, lipid panel with LDL of 22 and HDL of 40, echocardiogram with normal EF and grade 1 diastolic dysfunction.  No other significant abnormality. EEG was negative for any specific epileptiform waves but focal motor seizures cannot be excluded.

## 2020-10-11 NOTE — Assessment & Plan Note (Signed)
-  Evaluated by [place] RN, appreciate assistance -Continue dressing changes per [facility]OCN RN: Cleanse coccyx wound with NS and apply piece of calcium alginate to wound bed.  Sacral foam dressing to coccyx area.  Change every 3 days and as needed for soiling

## 2020-10-11 NOTE — Assessment & Plan Note (Addendum)
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  D5W infusion able to be discontinued -Sodium still stable on 10/15/2020.  Will trial off of DDAVP and monitor sodium level.  If remains stable may possibly be able to discharge on Monday -Sodium remaining stable on 10/16/2020.  Okay for discharge

## 2020-10-11 NOTE — Hospital Course (Addendum)
Ms. 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 She was sent to the ER this hospitalization after an episode of shaking in her right arm and right face. She underwent MRI brain which showed an acute left precentral gyrus infarct.  There was also concern for seizure activity given her shaking episode and acute CVA with history of metastatic disease s/p craniotomy.  Neurology was consulted and she was started on Keppra in the ER.  EEG was negative for epileptiform activity but focal motor seizures could not be excluded. She was also found to have worsening hypernatremia and deluded polyuria.  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  She was started on D5W and DDAVP.  Nephrology was also consulted. Sodium level slowly improved with treatment and she was able to be discontinued off of dextrose infusion on 10/13/2020. DDAVP was discontinued on 10/15/2020 and sodium again remained stable.  She was further monitored again overnight with again sodium levels remaining unchanged and was considered stable for discharging back to her SNF.  Of note, she is to remain on hydrocortisone for her history of adrenal insufficiency per [facility] discharge summary after review.  She was also started on Keppra per neurology which should be continued as noted above.  Glucose levels were slightly low at times and her Lantus dose was decreased.  She will need further CBG monitoring and possible further adjustment of insulin.

## 2020-10-11 NOTE — Assessment & Plan Note (Signed)
-   stable off meds at this time

## 2020-10-11 NOTE — Consult Note (Addendum)
WOC Nurse Consult Note: Reason for Consult:Stage 3 pressure injury to coccyx, present on admission Wound type:Stage 3 pressure injury Pressure Injury POA: Yes Measurement: 3 cm x 2 cm x 0.2 cm  Wound bed:red and moist Drainage (amount, consistency, odor) scant serous  No odor.  Periwound:intact skin frequently moist  Purewick in place and managing this currently.  Dressing procedure/placement/frequency:Cleanse coccyx wound with NS and apply piece of calcium alginate to wound bed.  Sacral foam to coccyx area  Change every three days and PRN soilage.  Will not follow at this time.  Please re-consult if needed.  Domenic Moras MSN, RN, FNP-BC CWON Wound, Ostomy, Continence Nurse Pager [phone]

## 2020-10-11 NOTE — Assessment & Plan Note (Signed)
-   see CVA - continue keppra

## 2020-10-11 NOTE — Progress Notes (Signed)
[place], [place] 10/11/20  Subjective:   LOS: 7  Cheyenne Anthony is a 75 y.o. 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 She presents to ED after reported shaking episode of right arm and face.   She is admitted for Acute CVA. She has been started on Aspirin and Keppra.  We have been consulted for hypernatremia. Sodium was 162 on admission. Currently receiving DDAVP and on dextrose IVF.   Patient resting in bed Alert and oriented x 2 Denies nausea Denies shortness of breath  UOP-2100 IVF-D5 @ 50 ml/hr   Objective:  Vital signs in last 24 hours:  Temp:  [97.4 F (36.3 C)-100 F (37.8 C)] 97.4 F (36.3 C) (03/16 1156) Pulse Rate:  [90-104] 99 (03/16 1156) Resp:  [16-20] 16 (03/16 1156) BP: (102-122)/(62-79) 122/78 (03/16 1156) SpO2:  [91 %-100 %] 99 % (03/16 1156)  Weight change:  Filed Weights   10/06/20 0500 10/07/20 0522 10/08/20 0538  Weight: 86.1 kg 87.4 kg 86 kg    Intake/Output:    Intake/Output Summary (Last 24 hours) at 10/11/2020 1202 Last data filed at 10/11/2020 1048 Gross per 24 hour  Intake 1778.31 ml  Output 2110 ml  Net -331.69 ml    Physical Exam: General: NAD, laying in bed  Head: Normocephalic, atraumatic. Dry oral mucosal membranes  Eyes: Anicteric  Lungs:  Clear to auscultation  Heart: Regular rate and rhythm  Abdomen:  Soft, nontender,   Extremities: no peripheral edema.  Neurologic: Alert, oriented to person place  Skin: No lesions    Basic Metabolic Panel:  Recent Labs  Lab 10/07/20 1647 10/08/20 0437 10/09/20 0535 10/09/20 1751 10/10/20 0431 10/10/20 1800 10/11/20 0619  NA 151* 150* 151* 142 148* 143 148*  K 3.2* 2.9* 3.1*  --  3.1*  --  3.6  CL 118* 116* 117*  --  113*  --  114*  CO2 26 29 28   --  27  --  26  GLUCOSE 161* 101* 103*  --  140*  --  143*  BUN 14 13 12   --  13  --  10  CREATININE 0.84 0.79 0.85  --  0.80  --  0.79   CALCIUM 9.2 9.0 8.8*  --  8.8*  --  8.7*  MG  --  2.4 2.4  --   --   --   --      CBC: Recent Labs  Lab 10/05/20 1007 10/06/20 0237 10/07/20 0514 10/10/20 0431  WBC 9.3 9.7 9.5 7.3  HGB 10.3* 10.5* 10.2* 9.5*  HCT 35.8* 36.8 34.5* 31.0*  MCV 90.9 91.8 89.4 86.4  PLT 507* 528* 463* 361     No results found for: HEPBSAG, HEPBSAB, HEPBIGM    Microbiology:  Recent Results (from the past 240 hour(s))  SARS CORONAVIRUS 2 (TAT 6-24 HRS) Nasopharyngeal Nasopharyngeal Swab     Status: None   Collection Time: 10/03/20  7:56 PM   Specimen: Nasopharyngeal Swab  Result Value Ref Range Status   SARS Coronavirus 2 NEGATIVE NEGATIVE Final    Comment: (NOTE) SARS-CoV-2 target nucleic acids are NOT DETECTED.  The SARS-CoV-2 RNA is generally detectable in upper and lower respiratory specimens during the acute phase of infection. Negative results do not preclude SARS-CoV-2 infection, do not rule out co-infections with other pathogens, and should not be used as the sole basis for treatment or other patient management decisions. Negative results must be  combined with clinical observations, patient history, and epidemiological information. The expected result is Negative.  Fact Sheet for Patients: SugarRoll.be  Fact Sheet for Healthcare Providers: https://www.woods-mathews.com/  This test is not yet approved or cleared by the [place] FDA and  has been authorized for detection and/or diagnosis of SARS-CoV-2 by FDA under an Emergency Use Authorization (EUA). This EUA will remain  in effect (meaning this test can be used) for the duration of the COVID-19 declaration under Se ction 564(b)(1) of the Act, 21 U.S.C. section 360bbb-3(b)(1), unless the authorization is terminated or revoked sooner.  Performed at [facility], [place] [address], [place], [place] [address]     Coagulation Studies: No results for input(s): LABPROT, INR in  the last 72 hours.  Urinalysis: No results for input(s): COLORURINE, LABSPEC, PHURINE, GLUCOSEU, HGBUR, BILIRUBINUR, KETONESUR, PROTEINUR, UROBILINOGEN, NITRITE, LEUKOCYTESUR in the last 72 hours.  Invalid input(s): APPERANCEUR    Imaging: No results found.   Medications:   . dextrose 50 mL/hr at 10/11/20 0945  . levETIRAcetam Stopped (10/11/20 0105)   .  stroke: mapping our early stages of recovery book   Does not apply Once  . apixaban  5 mg Oral BID  . aspirin EC  81 mg Oral Daily  . atorvastatin  20 mg Oral Daily  . desmopressin  2 spray Nasal BID  . insulin aspart  0-5 Units Subcutaneous QHS  . insulin aspart  0-9 Units Subcutaneous TID WC  . insulin glargine  10 Units Subcutaneous BID  . latanoprost  1 drop Both Eyes QHS  . potassium chloride  40 mEq Oral BID   acetaminophen **OR** acetaminophen (TYLENOL) oral liquid 160 mg/5 mL **OR** acetaminophen, LORazepam  Assessment/ Plan:  75 y.o. 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 Hypernatremia: with free water wasting and central diabetes insipidus.  - current level 148 this morning. - DDAVP intranasaly 21mcg bid - will consider decreasing the dose once sodium levels stabilize -  dextrose infusion 50 ml/hr  - Encouraged patient to drink fluids  2. Hypertension: Home regimen of lisinopril, hydrochlorothiazide, and metoprolol.  - BP controlled currently without any antihypertensives  3.   Hypokalemia:   Lab Results  Component Value Date   K 3.6 10/11/2020   - Potassium supplement ordered by primary team -improving     LOS: [place] 3/16/202212:02 PM  [place], [place]

## 2020-10-12 DIAGNOSIS — I2699 Other pulmonary embolism without acute cor pulmonale: Secondary | ICD-10-CM | POA: Diagnosis not present

## 2020-10-12 DIAGNOSIS — E271 Primary adrenocortical insufficiency: Secondary | ICD-10-CM | POA: Diagnosis not present

## 2020-10-12 LAB — BASIC METABOLIC PANEL
Anion gap: 7 (ref 5–15)
BUN: 10 mg/dL (ref 8–23)
CO2: 24 mmol/L (ref 22–32)
Calcium: 9 mg/dL (ref 8.9–10.3)
Chloride: 119 mmol/L — ABNORMAL HIGH (ref 98–111)
Creatinine, Ser: 0.8 mg/dL (ref 0.44–1.00)
GFR, Estimated: >60 mL/min (ref >60)
Glucose, Bld: 172 mg/dL — ABNORMAL HIGH (ref 70–99)
Potassium: 3.7 mmol/L (ref 3.5–5.1)
Sodium: 150 mmol/L — ABNORMAL HIGH (ref 135–145)

## 2020-10-12 LAB — CBC WITH DIFFERENTIAL/PLATELET
Abs Immature Granulocytes: 0.05 10*3/uL (ref 0.00–0.07)
Basophils Absolute: 0 10*3/uL (ref 0.0–0.1)
Basophils Relative: 0 %
Eosinophils Absolute: 0.1 10*3/uL (ref 0.0–0.5)
Eosinophils Relative: 1 %
HCT: 31.5 % — ABNORMAL LOW (ref 36.0–46.0)
Hemoglobin: 9.1 g/dL — ABNORMAL LOW (ref 12.0–15.0)
Immature Granulocytes: 1 %
Lymphocytes Relative: 31 %
Lymphs Abs: 2.2 10*3/uL (ref 0.7–4.0)
MCH: 25.6 pg — ABNORMAL LOW (ref 26.0–34.0)
MCHC: 28.9 g/dL — ABNORMAL LOW (ref 30.0–36.0)
MCV: 88.5 fL (ref 80.0–100.0)
Monocytes Absolute: 1 10*3/uL (ref 0.1–1.0)
Monocytes Relative: 14 %
Neutro Abs: 3.7 10*3/uL (ref 1.7–7.7)
Neutrophils Relative %: 53 %
Platelets: 357 10*3/uL (ref 150–400)
RBC: 3.56 MIL/uL — ABNORMAL LOW (ref 3.87–5.11)
RDW: 17.3 % — ABNORMAL HIGH (ref 11.5–15.5)
WBC: 7 10*3/uL (ref 4.0–10.5)
nRBC: 0 % (ref 0.0–0.2)

## 2020-10-12 LAB — GLUCOSE, CAPILLARY
Glucose-Capillary: 185 mg/dL — ABNORMAL HIGH (ref 70–99)
Glucose-Capillary: 265 mg/dL — ABNORMAL HIGH (ref 70–99)
Glucose-Capillary: 213 mg/dL — ABNORMAL HIGH (ref 70–99)
Glucose-Capillary: 161 mg/dL — ABNORMAL HIGH (ref 70–99)
Glucose-Capillary: 231 mg/dL — ABNORMAL HIGH (ref 70–99)

## 2020-10-12 LAB — MAGNESIUM: Magnesium: 2.6 mg/dL — ABNORMAL HIGH (ref 1.7–2.4)

## 2020-10-12 NOTE — Progress Notes (Signed)
[place], [place] 10/12/20  Subjective:   LOS: 8  Cheyenne Anthony is a 75 y.o. 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 She presents to ED after reported shaking episode of right arm and face.   She is admitted for Acute CVA. She has been started on Aspirin and Keppra.  We have been consulted for hypernatremia. Sodium was 162 on admission. Currently receiving DDAVP and on dextrose IVF.   Patient resting in bed Alert and oriented x 2 Denies nausea Denies shortness of breath  03/16 0701 - 03/17 0700 In: 1771.8 [P.O.:360; I.V.:1186.3; IV Piggyback:225.6] Out: 2300 [Urine:2300]   IVF-D5 @ 50 ml/hr   Objective:  Vital signs in last 24 hours:  Temp:  [97.7 F (36.5 C)-100 F (37.8 C)] 99.1 F (37.3 C) (03/17 1237) Pulse Rate:  [90-100] 94 (03/17 1237) Resp:  [15-16] 15 (03/17 1237) BP: (88-132)/(58-89) 116/81 (03/17 1237) SpO2:  [91 %-100 %] 100 % (03/17 1237)  Weight change:  Filed Weights   10/06/20 0500 10/07/20 0522 10/08/20 0538  Weight: 86.1 kg 87.4 kg 86 kg    Intake/Output:    Intake/Output Summary (Last 24 hours) at 10/12/2020 1454 Last data filed at 10/12/2020 1027 Gross per 24 hour  Intake 1963.31 ml  Output 2300 ml  Net -336.69 ml    Physical Exam: General: NAD, laying in bed  Head: Normocephalic, atraumatic. Dry oral mucosal membranes  Eyes: Anicteric  Lungs:  Clear to auscultation  Heart: Regular rate and rhythm  Abdomen:  Soft, nontender,   Extremities: no peripheral edema.  Neurologic: Alert, oriented to person place  Skin: No lesions    Basic Metabolic Panel:  Recent Labs  Lab 10/08/20 0437 10/09/20 0535 10/09/20 1751 10/10/20 0431 10/10/20 1800 10/11/20 0619 10/12/20 0627  NA 150* 151* 142 148* 143 148* 150*  K 2.9* 3.1*  --  3.1*  --  3.6 3.7  CL 116* 117*  --  113*  --  114* 119*  CO2 29 28  --  27  --  26 24  GLUCOSE 101* 103*  --   140*  --  143* 172*  BUN 13 12  --  13  --  10 10  CREATININE 0.79 0.85  --  0.80  --  0.79 0.80  CALCIUM 9.0 8.8*  --  8.8*  --  8.7* 9.0  MG 2.4 2.4  --   --   --   --  2.6*     CBC: Recent Labs  Lab 10/06/20 0237 10/07/20 0514 10/10/20 0431 10/12/20 0627  WBC 9.7 9.5 7.3 7.0  NEUTROABS  --   --   --  3.7  HGB 10.5* 10.2* 9.5* 9.1*  HCT 36.8 34.5* 31.0* 31.5*  MCV 91.8 89.4 86.4 88.5  PLT 528* 463* 361 357     No results found for: HEPBSAG, HEPBSAB, HEPBIGM    Microbiology:  Recent Results (from the past 240 hour(s))  SARS CORONAVIRUS 2 (TAT 6-24 HRS) Nasopharyngeal Nasopharyngeal Swab     Status: None   Collection Time: 10/03/20  7:56 PM   Specimen: Nasopharyngeal Swab  Result Value Ref Range Status   SARS Coronavirus 2 NEGATIVE NEGATIVE Final    Comment: (NOTE) SARS-CoV-2 target nucleic acids are NOT DETECTED.  The SARS-CoV-2 RNA is generally detectable in upper and lower respiratory specimens during the acute phase of infection. Negative results do not preclude SARS-CoV-2 infection, do not rule  out co-infections with other pathogens, and should not be used as the sole basis for treatment or other patient management decisions. Negative results must be combined with clinical observations, patient history, and epidemiological information. The expected result is Negative.  Fact Sheet for Patients: SugarRoll.be  Fact Sheet for Healthcare Providers: https://www.woods-mathews.com/  This test is not yet approved or cleared by the [place] FDA and  has been authorized for detection and/or diagnosis of SARS-CoV-2 by FDA under an Emergency Use Authorization (EUA). This EUA will remain  in effect (meaning this test can be used) for the duration of the COVID-19 declaration under Se ction 564(b)(1) of the Act, 21 U.S.C. section 360bbb-3(b)(1), unless the authorization is terminated or revoked sooner.  Performed at [facility], [place] [address] [place], [place] [address]     Coagulation Studies: No results for input(s): LABPROT, INR in the last 72 hours.  Urinalysis: No results for input(s): COLORURINE, LABSPEC, PHURINE, GLUCOSEU, HGBUR, BILIRUBINUR, KETONESUR, PROTEINUR, UROBILINOGEN, NITRITE, LEUKOCYTESUR in the last 72 hours.  Invalid input(s): APPERANCEUR    Imaging: No results found.   Medications:   . dextrose 50 mL/hr at 10/12/20 0910  . levETIRAcetam 500 mg (10/12/20 1153)   .  stroke: mapping our early stages of recovery book   Does not apply Once  . apixaban  5 mg Oral BID  . aspirin EC  81 mg Oral Daily  . atorvastatin  20 mg Oral Daily  . desmopressin  2 spray Nasal BID  . hydrocortisone  10 mg Oral q AM   And  . hydrocortisone  5 mg Oral Q1500  . insulin aspart  0-5 Units Subcutaneous QHS  . insulin aspart  0-9 Units Subcutaneous TID WC  . insulin glargine  10 Units Subcutaneous BID  . latanoprost  1 drop Both Eyes QHS  . potassium chloride  40 mEq Oral BID   acetaminophen **OR** acetaminophen (TYLENOL) oral liquid 160 mg/5 mL **OR** acetaminophen, LORazepam  Assessment/ Plan:  75 y.o. 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 Hypernatremia: with  central diabetes insipidus.  - current level 150 this morning. - DDAVP intranasaly 60mcg bid - will consider decreasing the dose once sodium  levels stabilize -  dextrose infusion 50 ml/hr  - Encouraged patient to drink fluids  2. Hypertension: Home regimen of lisinopril, hydrochlorothiazide, and metoprolol.  - BP controlled currently without any antihypertensives  3.  Hypokalemia:   Lab Results  Component Value Date   K 3.7 10/12/2020   - Potassium supplement ordered by primary team -improving     LOS: [place] 3/17/20222:54 [place], [place]

## 2020-10-12 NOTE — Progress Notes (Signed)
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 She was sent to the ER this hospitalization after an episode of shaking in her right arm and right face. She underwent MRI brain which showed an acute left precentral gyrus infarct.  There was also concern for seizure activity given her shaking episode and acute CVA with history of metastatic disease s/p craniotomy.  Neurology was consulted and she was started on Keppra in the ER.  EEG was negative for epileptiform activity but focal motor seizures could not be excluded. She was also found to have worsening hypernatremia and deluded polyuria.  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  She was started on D5W and DDAVP.  Nephrology was also consulted.   Interval History:  No events overnight.  More awake and alert this morning.  Encouraged her to continue water intake given ongoing hypernatremia.  Denied any acute concerns.  Baseline mild weakness in her right upper extremity from her previous stroke.  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   - started on Keppra per neurology  - history of craniotomy due to cerebellar mets.  Performed at [facility] in  January 2022 - CVA work-up with A1c of 7.4, lipid panel with LDL of 22 and HDL of 40, echocardiogram with normal EF and grade 1 diastolic dysfunction.  No other significant abnormality. EEG was negative for any specific epileptiform waves but focal motor seizures cannot be excluded. 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 abnormalities - continue D5W and DDAVP until Na stable and oral intake more consistent  - nephrology also following   Pulmonary emboli ([place]) - diagnosed at [facility] on CTA on 09/14/20; also has left popliteal DVT (near occlusive) on 09/15/20 duplex -Etiology considered due to hypercoagulable state in setting of active malignancy and prolonged immobility -She was considered reasonable candidate for Eliquis and was evaluated by neurosurgery as well (low risk for brain hemorrhage given time from surgery) -Continue Eliquis  Adrenal insufficiency, primary ([place]) - continue hydrocortisone; discharge summary from [facility] reviewed  DVT (deep venous thrombosis) ([place]) - see PE - continue Eliquis   Renal mass - per [facility] d/c summary: "CT with 1.2 cm left renal mass, recommend MRI for further characterization, however unclear if this would be informative in setting of known metastatic NSCLC. Patient to follow this up as outpatient."  Ankle pain - xray negative for acute fractures  - symptomatic management   Pressure ulcer of coccygeal region, stage 3 ([place]) -Evaluated by [place] RN, appreciate assistance -Continue dressing changes per [facility]OC[facility] RN: Cleanse coccyx wound with NS and apply piece of calcium alginate to wound bed.  Sacral foam dressing to coccyx  area.  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 -Patient is high risk for further decline  - PCM has spoke  with family; currently GOC are to continue full measures at this time  Type II diabetes mellitus with renal manifestations ([place]) - continue SSI and CBG monitoring   Hypertension - stable off meds at this time   Episode of shaking-resolved as of 10/11/2020 - see CVA - continue keppra   Old records reviewed in assessment of this patient  Antimicrobials: n/a  DVT prophylaxis:  apixaban (ELIQUIS) tablet 5 mg   Code Status:   Code Status: Full Code Family Communication:   Disposition Plan: Status is: Inpatient  Remains inpatient appropriate because:Altered mental status, Unsafe d/c plan and Inpatient level of care appropriate due to severity of illness   Dispo: The patient is from: SNF              Anticipated d/c is to: SNF              Patient currently is not medically stable to d/c.   Difficult to place patient No   Risk of unplanned readmission score: Unplanned Admission- Pilot do not use: 25.2   Objective: Blood pressure (!) 88/58, pulse 99, temperature 99.5 F (37.5 C), resp. rate 16, height 5\' 4"  (1.626 m), weight 86 kg, SpO2 91 %.  Examination: General appearance: Chronically ill-appearing elderly woman laying in bed in no obvious distress Head: Normocephalic, without obvious abnormality, atraumatic Eyes: EOMI, PERRL Lungs: clear to auscultation bilaterally Heart: regular rate and rhythm and S1, S2 normal Abdomen: normal findings: bowel sounds normal and soft, non-tender Extremities: no edema Skin: mobility and turgor normal Neurologic: Follows commands, moves all 4 extremities.  4/5 strength throughout  Consultants:   Nephrology  Neurology   Procedures:   EEG  Data Reviewed: I have personally reviewed following labs and imaging studies Results for orders placed or performed during the hospital encounter of 10/03/20 (from the past 24 hour(s))  Glucose, capillary     Status: Abnormal   Collection Time: 10/11/20  3:37 PM  Result Value Ref Range    Glucose-Capillary 134 (H) 70 - 99 mg/dL  Glucose, capillary     Status: Abnormal   Collection Time: 10/11/20  8:09 PM  Result Value Ref Range   Glucose-Capillary 217 (H) 70 - 99 mg/dL  Basic metabolic panel     Status: Abnormal   Collection Time: 10/12/20  6:27 AM  Result Value Ref Range   Sodium 150 (H) 135 - 145 mmol/L   Potassium 3.7 3.5 - 5.1 mmol/L   Chloride 119 (H) 98 - 111 mmol/L   CO2 24 22 - 32 mmol/L   Glucose, Bld 172 (H) 70 - 99 mg/dL   BUN 10 8 - 23 mg/dL   Creatinine, Ser 0.80 0.44 - 1.00 mg/dL   Calcium 9.0 8.9 - 10.3 mg/dL   GFR, Estimated >60 >60 mL/min   Anion gap 7 5 - 15  CBC with Differential/Platelet     Status: Abnormal   Collection Time: 10/12/20  6:27 AM  Result Value Ref Range   WBC 7.0 4.0 - 10.5 K/uL   RBC 3.56 (L) 3.87 - 5.11 MIL/uL   Hemoglobin 9.1 (L) 12.0 - 15.0 g/dL   HCT 31.5 (L) 36.0 - 46.0 %   MCV 88.5 80.0 - 100.0 fL   MCH 25.6 (L)  26.0 - 34.0 pg   MCHC 28.9 (L) 30.0 - 36.0 g/dL   RDW 17.3 (H) 11.5 - 15.5 %   Platelets 357 150 - 400 K/uL   nRBC 0.0 0.0 - 0.2 %   Neutrophils Relative % 53 %   Neutro Abs 3.7 1.7 - 7.7 K/uL   Lymphocytes Relative 31 %   Lymphs Abs 2.2 0.7 - 4.0 K/uL   Monocytes Relative 14 %   Monocytes Absolute 1.0 0.1 - 1.0 K/uL   Eosinophils Relative 1 %   Eosinophils Absolute 0.1 0.0 - 0.5 K/uL   Basophils Relative 0 %   Basophils Absolute 0.0 0.0 - 0.1 K/uL   Immature Granulocytes 1 %   Abs Immature Granulocytes 0.05 0.00 - 0.07 K/uL  Magnesium     Status: Abnormal   Collection Time: 10/12/20  6:27 AM  Result Value Ref Range   Magnesium 2.6 (H) 1.7 - 2.4 mg/dL  Glucose, capillary     Status: Abnormal   Collection Time: 10/12/20  7:48 AM  Result Value Ref Range   Glucose-Capillary 185 (H) 70 - 99 mg/dL  Glucose, capillary     Status: Abnormal   Collection Time: 10/12/20 11:41 AM  Result Value Ref Range   Glucose-Capillary 265 (H) 70 - 99 mg/dL    Recent Results (from the past 240 hour(s))  SARS  CORONAVIRUS 2 (TAT 6-24 HRS) Nasopharyngeal Nasopharyngeal Swab     Status: None   Collection Time: 10/03/20  7:56 PM   Specimen: Nasopharyngeal Swab  Result Value Ref Range Status   SARS Coronavirus 2 NEGATIVE NEGATIVE Final    Comment: (NOTE) SARS-CoV-2 target nucleic acids are NOT DETECTED.  The SARS-CoV-2 RNA is generally detectable in upper and lower respiratory specimens during the acute phase of infection. Negative results do not preclude SARS-CoV-2 infection, do not rule out co-infections with other pathogens, and should not be used as the sole basis for treatment or other patient management decisions. Negative results must be combined with clinical observations, patient history, and epidemiological information. The expected result is Negative.  Fact Sheet for Patients: SugarRoll.be  Fact Sheet for Healthcare Providers: https://www.woods-mathews.com/  This test is not yet approved or cleared by the [place] FDA and  has been authorized for detection and/or diagnosis of SARS-CoV-2 by FDA under an Emergency Use Authorization (EUA). This EUA will remain  in effect (meaning this test can be used) for the duration of the COVID-19 declaration under Se ction 564(b)(1) of the Act, 21 U.S.C. section 360bbb-3(b)(1), unless the authorization is terminated or revoked sooner.  Performed at [facility], [place] [address] [place], [place] [address]      Radiology Studies: No results found. DG Ankle Complete Right  Final Result    US Venous Img Lower Bilateral (DVT)  Final Result    CT ANGIOGRAM HEAD NECK W WO CONTRAST  Final Result    MR BRAIN WO CONTRAST  Final Result    CT Head Wo Contrast  Final Result      Scheduled Meds: .  stroke: mapping our early stages of recovery book   Does not apply Once  . apixaban  5 mg Oral BID  . aspirin EC  81 mg Oral Daily  . atorvastatin  20 mg Oral Daily  . desmopressin  2 spray  Nasal BID  . hydrocortisone  10 mg Oral q AM   And  . hydrocortisone  5 mg Oral Q1500  . insulin aspart  0-5 Units  Subcutaneous QHS  . insulin aspart  0-9 Units Subcutaneous TID WC  . insulin glargine  10 Units Subcutaneous BID  . latanoprost  1 drop Both Eyes QHS  . potassium chloride  40 mEq Oral BID   PRN Meds: acetaminophen **OR** acetaminophen (TYLENOL) oral liquid 160 mg/5 mL **OR** acetaminophen, LORazepam Continuous Infusions: . dextrose 50 mL/hr at 10/12/20 0910  . levETIRAcetam 500 mg (10/12/20 1153)     LOS: 8 days  Time spent: Greater than 50% of the 35 minute visit was spent in counseling/coordination of care for the patient as laid out in the A&P.   Dwyane Dee, MD [facility] 10/12/2020, 12:35 PM

## 2020-10-13 DIAGNOSIS — E870 Hyperosmolality and hypernatremia: Secondary | ICD-10-CM | POA: Diagnosis not present

## 2020-10-13 LAB — BASIC METABOLIC PANEL
Anion gap: 7 (ref 5–15)
Anion gap: 5 (ref 5–15)
BUN: 15 mg/dL (ref 8–23)
BUN: 13 mg/dL (ref 8–23)
CO2: 21 mmol/L — ABNORMAL LOW (ref 22–32)
CO2: 22 mmol/L (ref 22–32)
Calcium: 8.9 mg/dL (ref 8.9–10.3)
Calcium: 8.5 mg/dL — ABNORMAL LOW (ref 8.9–10.3)
Chloride: 115 mmol/L — ABNORMAL HIGH (ref 98–111)
Chloride: 111 mmol/L (ref 98–111)
Creatinine, Ser: 0.76 mg/dL (ref 0.44–1.00)
Creatinine, Ser: 0.73 mg/dL (ref 0.44–1.00)
GFR, Estimated: >60 mL/min (ref >60)
GFR, Estimated: >60 mL/min (ref >60)
Glucose, Bld: 172 mg/dL — ABNORMAL HIGH (ref 70–99)
Glucose, Bld: 155 mg/dL — ABNORMAL HIGH (ref 70–99)
Potassium: 4.4 mmol/L (ref 3.5–5.1)
Potassium: 4.3 mmol/L (ref 3.5–5.1)
Sodium: 143 mmol/L (ref 135–145)
Sodium: 138 mmol/L (ref 135–145)

## 2020-10-13 LAB — CBC WITH DIFFERENTIAL/PLATELET
Abs Immature Granulocytes: 0.09 10*3/uL — ABNORMAL HIGH (ref 0.00–0.07)
Basophils Absolute: 0 10*3/uL (ref 0.0–0.1)
Basophils Relative: 0 %
Eosinophils Absolute: 0.1 10*3/uL (ref 0.0–0.5)
Eosinophils Relative: 1 %
HCT: 31.1 % — ABNORMAL LOW (ref 36.0–46.0)
Hemoglobin: 9.1 g/dL — ABNORMAL LOW (ref 12.0–15.0)
Immature Granulocytes: 1 %
Lymphocytes Relative: 33 %
Lymphs Abs: 2.6 10*3/uL (ref 0.7–4.0)
MCH: 25.9 pg — ABNORMAL LOW (ref 26.0–34.0)
MCHC: 29.3 g/dL — ABNORMAL LOW (ref 30.0–36.0)
MCV: 88.4 fL (ref 80.0–100.0)
Monocytes Absolute: 0.9 10*3/uL (ref 0.1–1.0)
Monocytes Relative: 12 %
Neutro Abs: 4.1 10*3/uL (ref 1.7–7.7)
Neutrophils Relative %: 53 %
Platelets: 374 10*3/uL (ref 150–400)
RBC: 3.52 MIL/uL — ABNORMAL LOW (ref 3.87–5.11)
RDW: 17.5 % — ABNORMAL HIGH (ref 11.5–15.5)
WBC: 7.9 10*3/uL (ref 4.0–10.5)
nRBC: 0 % (ref 0.0–0.2)

## 2020-10-13 LAB — GLUCOSE, CAPILLARY
Glucose-Capillary: 155 mg/dL — ABNORMAL HIGH (ref 70–99)
Glucose-Capillary: 175 mg/dL — ABNORMAL HIGH (ref 70–99)
Glucose-Capillary: 237 mg/dL — ABNORMAL HIGH (ref 70–99)
Glucose-Capillary: 130 mg/dL — ABNORMAL HIGH (ref 70–99)

## 2020-10-13 LAB — SODIUM, URINE, RANDOM: Sodium, Ur: <10 mmol/L

## 2020-10-13 LAB — SODIUM: Sodium: 136 mmol/L (ref 135–145)

## 2020-10-13 LAB — MAGNESIUM: Magnesium: 2.5 mg/dL — ABNORMAL HIGH (ref 1.7–2.4)

## 2020-10-13 LAB — OSMOLALITY, URINE: Osmolality, Ur: 181 mOsm/kg — ABNORMAL LOW (ref 300–900)

## 2020-10-13 MED ORDER — LEVETIRACETAM 500 MG PO TABS
500.0000 mg | ORAL_TABLET | Freq: Two times a day (BID) | ORAL | Status: DC
  Administered 2020-10-13 – 2020-10-16 (×7): 500 mg via ORAL
  Filled 2020-10-13 (×8): qty 1

## 2020-10-13 NOTE — Progress Notes (Signed)
[place], [place] 10/13/20  Subjective:   LOS: 9  Cheyenne Anthony is a 75 y.o. 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 She presents to ED after reported shaking episode of right arm and face.   She is admitted for Acute CVA. She has been started on Aspirin and Keppra.  We have been consulted for hypernatremia. Sodium was 162 on admission. Currently receiving DDAVP and on dextrose IVF.   Patient resting in bed Able to answer few simple questions Denies nausea Denies shortness of breath Daughter at bedside today  03/17 0701 - 03/18 0700 In: 1448.5 [P.O.:600; I.V.:748.5; IV Piggyback:100] Out: 6100 [Urine:6100]   IVF-D5 @ 50 ml/hr   Objective:  Vital signs in last 24 hours:  Temp:  [97.3 F (36.3 C)-99.2 F (37.3 C)] 98 F (36.7 C) (03/18 1212) Pulse Rate:  [82-103] 91 (03/18 1212) Resp:  [15-20] 20 (03/18 1212) BP: (107-133)/(73-93) 118/81 (03/18 1212) SpO2:  [97 %-100 %] 100 % (03/18 1212)  Weight change:  Filed Weights   10/06/20 0500 10/07/20 0522 10/08/20 0538  Weight: 86.1 kg 87.4 kg 86 kg    Intake/Output:    Intake/Output Summary (Last 24 hours) at 10/13/2020 1334 Last data filed at 10/13/2020 1002 Gross per 24 hour  Intake 360 ml  Output 4000 ml  Net -3640 ml    Physical Exam: General: NAD, laying in bed  Head: Normocephalic, atraumatic. Dry oral mucosal membranes  Eyes: Anicteric  Lungs:  Clear to auscultation  Heart: Regular rate and rhythm  Abdomen:  Soft, nontender,   Extremities: no peripheral edema.  Neurologic: Alert, oriented to self and person  Skin: No lesions    Basic Metabolic Panel:  Recent Labs  Lab 10/08/20 0437 10/09/20 0535 10/09/20 1751 10/10/20 0431 10/10/20 1800 10/11/20 0619 10/12/20 0627 10/13/20 0419 10/13/20 1307  NA 150* 151*   < > 148* 143 148* 150* 143 138  K 2.9* 3.1*  --  3.1*  --  3.6 3.7 4.4 4.3  CL  116* 117*  --  113*  --  114* 119* 115* 111  CO2 29 28  --  27  --  26 24 21* 22  GLUCOSE 101* 103*  --  140*  --  143* 172* 172* 155*  BUN 13 12  --  13  --  10 10 15 13   CREATININE 0.79 0.85  --  0.80  --  0.79 0.80 0.76 0.73  CALCIUM 9.0 8.8*  --  8.8*  --  8.7* 9.0 8.9 8.5*  MG 2.4 2.4  --   --   --   --  2.6* 2.5*  --    < > = values in this interval not displayed.     CBC: Recent Labs  Lab 10/07/20 0514 10/10/20 0431 10/12/20 0627 10/13/20 0419  WBC 9.5 7.3 7.0 7.9  NEUTROABS  --   --  3.7 4.1  HGB 10.2* 9.5* 9.1* 9.1*  HCT 34.5* 31.0* 31.5* 31.1*  MCV 89.4 86.4 88.5 88.4  PLT 463* 361 357 374     No results found for: HEPBSAG, HEPBSAB, HEPBIGM    Microbiology:  Recent Results (from the past 240 hour(s))  SARS CORONAVIRUS 2 (TAT 6-24 HRS) Nasopharyngeal Nasopharyngeal Swab     Status: None   Collection Time: 10/03/20  7:56 PM   Specimen: Nasopharyngeal Swab  Result Value Ref Range Status   SARS Coronavirus 2  NEGATIVE NEGATIVE Final    Comment: (NOTE) SARS-CoV-2 target nucleic acids are NOT DETECTED.  The SARS-CoV-2 RNA is generally detectable in upper and lower respiratory specimens during the acute phase of infection. Negative results do not preclude SARS-CoV-2 infection, do not rule out co-infections with other pathogens, and should not be used as the sole basis for treatment or other patient management decisions. Negative results must be combined with clinical observations, patient history, and epidemiological information. The expected result is Negative.  Fact Sheet for Patients: SugarRoll.be  Fact Sheet for Healthcare Providers: https://www.woods-mathews.com/  This test is not yet approved or cleared by the [place] FDA and  has been authorized for detection and/or diagnosis of SARS-CoV-2 by FDA under an Emergency Use Authorization (EUA). This EUA will remain  in effect (meaning this test can be used)  for the duration of the COVID-19 declaration under Se ction 564(b)(1) of the Act, 21 U.S.C. section 360bbb-3(b)(1), unless the authorization is terminated or revoked sooner.  Performed at [facility], [place] [address] [place], [place] [address]     Coagulation Studies: No results for input(s): LABPROT, INR in the last 72 hours.  Urinalysis: No results for input(s): COLORURINE, LABSPEC, PHURINE, GLUCOSEU, HGBUR, BILIRUBINUR, KETONESUR, PROTEINUR, UROBILINOGEN, NITRITE, LEUKOCYTESUR in the last 72 hours.  Invalid input(s): APPERANCEUR    Imaging: No results found.   Medications:   . dextrose 50 mL/hr at 10/12/20 0910   .  stroke: mapping our early stages of recovery book   Does not apply Once  . apixaban  5 mg Oral BID  . aspirin EC  81 mg Oral Daily  . atorvastatin  20 mg Oral Daily  . desmopressin  2 spray Nasal BID  . hydrocortisone  10 mg Oral q AM   And  . hydrocortisone  5 mg Oral Q1500  . insulin aspart  0-5 Units Subcutaneous QHS  . insulin aspart  0-9 Units Subcutaneous TID WC  . insulin glargine  10 Units Subcutaneous BID  . latanoprost  1 drop Both Eyes QHS  . levETIRAcetam  500 mg Oral BID  . potassium chloride  40 mEq Oral BID   acetaminophen **OR** acetaminophen (TYLENOL) oral liquid 160 mg/5 mL **OR** acetaminophen, LORazepam  Assessment/ Plan:  75 y.o. 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 Hypernatremia: with  central diabetes insipidus.  - polyuric again today - DDAVP intranasaly 1mcg bid - will consider decreasing the dose once sodium levels stabilize - Encouraged patient to drink fluids -Most recent sodium level 138 this afternoon -Discontinue D5W supplementation - monitor NA level closely  03/17 0701 - 03/18 0700 In: 1448.5 [P.O.:600; I.V.:748.5; IV Piggyback:100] Out: 6100 [Urine:6100]      LOS: [place] 3/18/20221:34 PM  [facility], [place]

## 2020-10-13 NOTE — Care Management Important Message (Signed)
Important Message  Patient Details  Name: Cheyenne Anthony MRN: [id number] Date of Birth: [birth year]   Medicare Important Message Given:  Yes     [name] 10/13/2020, 12:17 PM

## 2020-10-13 NOTE — Progress Notes (Signed)
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 She was sent to the ER this hospitalization after an episode of shaking in her right arm and right face. She underwent MRI brain which showed an acute left precentral gyrus infarct.  There was also concern for seizure activity given her shaking episode and acute CVA with history of metastatic disease s/p craniotomy.  Neurology was consulted and she was started on Keppra in the ER.  EEG was negative for epileptiform activity but focal motor seizures could not be excluded. She was also found to have worsening hypernatremia and deluded polyuria.  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  She was started on D5W and DDAVP.  Nephrology was also consulted.   Interval History:  No events overnight. Remains awake/alert. Did drink/eat better some too and her Na is better today. Talked to daughter on phone, she also confirmed patient has poor oral intake at [facility] but will continue to encourage patient for intake.   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   - started on Keppra per neurology  - history of craniotomy due to cerebellar mets.  Performed at [facility] in  January 2022 - CVA work-up with A1c of 7.4, lipid panel with LDL of 22 and HDL of 40, echocardiogram with normal EF and grade 1 diastolic dysfunction.  No other significant abnormality. EEG was negative for any specific epileptiform waves but focal motor seizures cannot be excluded. 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 abnormalities - continue D5W and DDAVP until Na stable and oral intake more consistent  - nephrology also following  - Na 143 on 3/18; will repeat Na this afternoon, if remaining stable will try and stop D5W and see if she can maintain; as well as possibly decrease DDAVP  Pulmonary emboli ([place]) - diagnosed at [facility] on CTA on 09/14/20; also has left popliteal DVT (near occlusive) on 09/15/20 duplex -Etiology considered due to hypercoagulable state in setting of active malignancy and prolonged immobility -She was considered reasonable candidate for Eliquis and was evaluated by neurosurgery as well (low risk for brain hemorrhage given time from surgery) -Continue Eliquis  Adrenal insufficiency, primary ([place]) - continue hydrocortisone; discharge summary from [facility] reviewed  DVT (deep venous thrombosis) ([place]) - see PE - continue Eliquis   Renal mass - per [facility] d/c summary: "CT with 1.2 cm left renal mass, recommend MRI for further characterization, however unclear if this would be informative in setting of known metastatic NSCLC. Patient to follow this up as outpatient."  Ankle pain - xray negative for acute fractures  - symptomatic management   Pressure ulcer of coccygeal region, stage 3 ([place]) -  Evaluated by [place] RN, appreciate assistance -Continue dressing changes per WOCN RN: Cleanse coccyx wound with NS and apply piece of calcium alginate to wound bed.  Sacral foam dressing to coccyx area.  Change every 3 days and as needed for soiling  Lung cancer with cerebellar  metastases s/p craniotomy 08/02/20 at [facility] -She will continue outpatient follow-up with oncology. -Patient is high risk for further decline  - PCM has spoke with family; currently GOC are to continue full measures at this time  Type II diabetes mellitus with renal manifestations ([place]) - continue SSI and CBG monitoring   Hypertension - stable off meds at this time   Episode of shaking-resolved as of 10/11/2020 - see CVA - continue keppra   Old records reviewed in assessment of this patient  Antimicrobials: n/a  DVT prophylaxis:  apixaban (ELIQUIS) tablet 5 mg   Code Status:   Code Status: Full Code Family Communication:   Disposition Plan: Status is: Inpatient  Remains inpatient appropriate because:Altered mental status, Unsafe d/c plan and Inpatient level of care appropriate due to severity of illness   Dispo: The patient is from: SNF              Anticipated d/c is to: SNF              Patient currently is not medically stable to d/c.   Difficult to place patient No   Risk of unplanned readmission score: Unplanned Admission- Pilot do not use: 25.47   Objective: Blood pressure 127/84, pulse 97, temperature 98.6 F (37 C), temperature source Oral, resp. rate 16, height 5\' 4"  (1.626 m), weight 86 kg, SpO2 97 %.  Examination: General appearance: Chronically ill-appearing elderly woman laying in bed in no obvious distress Head: Normocephalic, without obvious abnormality, atraumatic Eyes: EOMI, PERRL Lungs: clear to auscultation bilaterally Heart: regular rate and rhythm and S1, S2 normal Abdomen: normal findings: bowel sounds normal and soft, non-tender Extremities: no edema Skin: mobility and turgor normal Neurologic: Follows commands, moves all 4 extremities.  4/5 strength in UE; 3+/5 strength in LE  Consultants:   Nephrology  Neurology   Procedures:   EEG  Data Reviewed: I have personally reviewed following labs and imaging studies Results for orders  placed or performed during the hospital encounter of 10/03/20 (from the past 24 hour(s))  Glucose, capillary     Status: Abnormal   Collection Time: 10/12/20 11:41 AM  Result Value Ref Range   Glucose-Capillary 265 (H) 70 - 99 mg/dL  Glucose, capillary     Status: Abnormal   Collection Time: 10/12/20 12:40 PM  Result Value Ref Range   Glucose-Capillary 213 (H) 70 - 99 mg/dL  Glucose, capillary     Status: Abnormal   Collection Time: 10/12/20  4:19 PM  Result Value Ref Range   Glucose-Capillary 161 (H) 70 - 99 mg/dL  Glucose, capillary     Status: Abnormal   Collection Time: 10/12/20  8:50 PM  Result Value Ref Range   Glucose-Capillary 231 (H) 70 - 99 mg/dL  Basic metabolic panel     Status: Abnormal   Collection Time: 10/13/20  4:19 AM  Result Value Ref Range   Sodium 143 135 - 145 mmol/L   Potassium 4.4 3.5 - 5.1 mmol/L   Chloride 115 (H) 98 - 111 mmol/L   CO2 21 (L) 22 - 32 mmol/L   Glucose, Bld 172 (H) 70 - 99 mg/dL   BUN 15 8 - 23 mg/dL   Creatinine, Ser  0.76 0.44 - 1.00 mg/dL   Calcium 8.9 8.9 - 10.3 mg/dL   GFR, Estimated >60 >60 mL/min   Anion gap 7 5 - 15  CBC with Differential/Platelet     Status: Abnormal   Collection Time: 10/13/20  4:19 AM  Result Value Ref Range   WBC 7.9 4.0 - 10.5 K/uL   RBC 3.52 (L) 3.87 - 5.11 MIL/uL   Hemoglobin 9.1 (L) 12.0 - 15.0 g/dL   HCT 31.1 (L) 36.0 - 46.0 %   MCV 88.4 80.0 - 100.0 fL   MCH 25.9 (L) 26.0 - 34.0 pg   MCHC 29.3 (L) 30.0 - 36.0 g/dL   RDW 17.5 (H) 11.5 - 15.5 %   Platelets 374 150 - 400 K/uL   nRBC 0.0 0.0 - 0.2 %   Neutrophils Relative % 53 %   Neutro Abs 4.1 1.7 - 7.7 K/uL   Lymphocytes Relative 33 %   Lymphs Abs 2.6 0.7 - 4.0 K/uL   Monocytes Relative 12 %   Monocytes Absolute 0.9 0.1 - 1.0 K/uL   Eosinophils Relative 1 %   Eosinophils Absolute 0.1 0.0 - 0.5 K/uL   Basophils Relative 0 %   Basophils Absolute 0.0 0.0 - 0.1 K/uL   Immature Granulocytes 1 %   Abs Immature Granulocytes 0.09 (H) 0.00 - 0.07  K/uL  Magnesium     Status: Abnormal   Collection Time: 10/13/20  4:19 AM  Result Value Ref Range   Magnesium 2.5 (H) 1.7 - 2.4 mg/dL  Glucose, capillary     Status: Abnormal   Collection Time: 10/13/20  8:17 AM  Result Value Ref Range   Glucose-Capillary 155 (H) 70 - 99 mg/dL    Recent Results (from the past 240 hour(s))  SARS CORONAVIRUS 2 (TAT 6-24 HRS) Nasopharyngeal Nasopharyngeal Swab     Status: None   Collection Time: 10/03/20  7:56 PM   Specimen: Nasopharyngeal Swab  Result Value Ref Range Status   SARS Coronavirus 2 NEGATIVE NEGATIVE Final    Comment: (NOTE) SARS-CoV-2 target nucleic acids are NOT DETECTED.  The SARS-CoV-2 RNA is generally detectable in upper and lower respiratory specimens during the acute phase of infection. Negative results do not preclude SARS-CoV-2 infection, do not rule out co-infections with other pathogens, and should not be used as the sole basis for treatment or other patient management decisions. Negative results must be combined with clinical observations, patient history, and epidemiological information. The expected result is Negative.  Fact Sheet for Patients: SugarRoll.be  Fact Sheet for Healthcare Providers: https://www.woods-mathews.com/  This test is not yet approved or cleared by the [place] FDA and  has been authorized for detection and/or diagnosis of SARS-CoV-2 by FDA under an Emergency Use Authorization (EUA). This EUA will remain  in effect (meaning this test can be used) for the duration of the COVID-19 declaration under Se ction 564(b)(1) of the Act, 21 U.S.C. section 360bbb-3(b)(1), unless the authorization is terminated or revoked sooner.  Performed at [facility], [place] [address] [place], [place] [address]      Radiology Studies: No results found. DG Ankle Complete Right  Final Result    US Venous Img Lower Bilateral (DVT)  Final Result    CT ANGIOGRAM  HEAD NECK W WO CONTRAST  Final Result    MR BRAIN WO CONTRAST  Final Result    CT Head Wo Contrast  Final Result      Scheduled Meds: .  stroke: mapping our early  stages of recovery book   Does not apply Once  . apixaban  5 mg Oral BID  . aspirin EC  81 mg Oral Daily  . atorvastatin  20 mg Oral Daily  . desmopressin  2 spray Nasal BID  . hydrocortisone  10 mg Oral q AM   And  . hydrocortisone  5 mg Oral Q1500  . insulin aspart  0-5 Units Subcutaneous QHS  . insulin aspart  0-9 Units Subcutaneous TID WC  . insulin glargine  10 Units Subcutaneous BID  . latanoprost  1 drop Both Eyes QHS  . levETIRAcetam  500 mg Oral BID  . potassium chloride  40 mEq Oral BID   PRN Meds: acetaminophen **OR** acetaminophen (TYLENOL) oral liquid 160 mg/5 mL **OR** acetaminophen, LORazepam Continuous Infusions: . dextrose 50 mL/hr at 10/12/20 0910     LOS: 9 days  Time spent: Greater than 50% of the 35 minute visit was spent in counseling/coordination of care for the patient as laid out in the A&P.   Dwyane Dee, MD [facility] 10/13/2020, 11:10 AM

## 2020-10-14 DIAGNOSIS — E271 Primary adrenocortical insufficiency: Secondary | ICD-10-CM | POA: Diagnosis not present

## 2020-10-14 DIAGNOSIS — E870 Hyperosmolality and hypernatremia: Secondary | ICD-10-CM | POA: Diagnosis not present

## 2020-10-14 LAB — CBC WITH DIFFERENTIAL/PLATELET
Abs Immature Granulocytes: 0.06 10*3/uL (ref 0.00–0.07)
Basophils Absolute: 0 10*3/uL (ref 0.0–0.1)
Basophils Relative: 1 %
Eosinophils Absolute: 0.1 10*3/uL (ref 0.0–0.5)
Eosinophils Relative: 1 %
HCT: 29.3 % — ABNORMAL LOW (ref 36.0–46.0)
Hemoglobin: 8.7 g/dL — ABNORMAL LOW (ref 12.0–15.0)
Immature Granulocytes: 1 %
Lymphocytes Relative: 34 %
Lymphs Abs: 2.1 10*3/uL (ref 0.7–4.0)
MCH: 26 pg (ref 26.0–34.0)
MCHC: 29.7 g/dL — ABNORMAL LOW (ref 30.0–36.0)
MCV: 87.5 fL (ref 80.0–100.0)
Monocytes Absolute: 0.7 10*3/uL (ref 0.1–1.0)
Monocytes Relative: 12 %
Neutro Abs: 3.2 10*3/uL (ref 1.7–7.7)
Neutrophils Relative %: 51 %
Platelets: 380 10*3/uL (ref 150–400)
RBC: 3.35 MIL/uL — ABNORMAL LOW (ref 3.87–5.11)
RDW: 17.4 % — ABNORMAL HIGH (ref 11.5–15.5)
WBC: 6.2 10*3/uL (ref 4.0–10.5)
nRBC: 0 % (ref 0.0–0.2)

## 2020-10-14 LAB — GLUCOSE, CAPILLARY
Glucose-Capillary: 106 mg/dL — ABNORMAL HIGH (ref 70–99)
Glucose-Capillary: 179 mg/dL — ABNORMAL HIGH (ref 70–99)
Glucose-Capillary: 181 mg/dL — ABNORMAL HIGH (ref 70–99)
Glucose-Capillary: 137 mg/dL — ABNORMAL HIGH (ref 70–99)

## 2020-10-14 LAB — BASIC METABOLIC PANEL
Anion gap: 3 — ABNORMAL LOW (ref 5–15)
BUN: 11 mg/dL (ref 8–23)
CO2: 23 mmol/L (ref 22–32)
Calcium: 8.9 mg/dL (ref 8.9–10.3)
Chloride: 118 mmol/L — ABNORMAL HIGH (ref 98–111)
Creatinine, Ser: 0.69 mg/dL (ref 0.44–1.00)
GFR, Estimated: >60 mL/min (ref >60)
Glucose, Bld: 99 mg/dL (ref 70–99)
Potassium: 4 mmol/L (ref 3.5–5.1)
Sodium: 144 mmol/L (ref 135–145)

## 2020-10-14 LAB — MAGNESIUM: Magnesium: 2.4 mg/dL (ref 1.7–2.4)

## 2020-10-14 NOTE — Progress Notes (Signed)
PROGRESS NOTE    Cheyenne Anthony   BMW:[id number]  DOB: 07-Mar-1946  DOA: 10/03/2020     10  PCP: [place]  CC: shaking  Hospital Course: Ms. 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 She was sent to the ER this hospitalization after an episode of shaking in her right arm and right face. She underwent MRI brain which showed an acute left precentral gyrus infarct.  There was also concern for seizure activity given her shaking episode and acute CVA with history of metastatic disease s/p craniotomy.  Neurology was consulted and she was started on Keppra in the ER.  EEG was negative for epileptiform activity but focal motor seizures could not be excluded. She was also found to have worsening hypernatremia and deluded polyuria.  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  She was started on D5W and DDAVP.  Nephrology was also consulted. Sodium level slowly improved with treatment and she was able to be discontinued off of dextrose infusion on 10/13/2020.   Interval History:  No events overnight.  Still resting in bed comfortably and remains alert and oriented.  Endorses her appetite has been still good since yesterday.  She is trying to eat and drink.  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   - started on Keppra per neurology  - history  of craniotomy due to cerebellar mets.  Performed at [facility] in January 2022 - CVA work-up with A1c of 7.4, lipid panel with LDL of 22 and HDL of 40, echocardiogram with normal EF and grade 1 diastolic dysfunction.  No other significant abnormality. EEG was negative for any specific epileptiform waves but focal motor seizures cannot be excluded. 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  D5W infusion able to be discontinued -Continue DDAVP and wean down per nephrology.  Sodium level this morning slightly higher than yesterday since discontinuation of D5W -Repeat BMP in a.m.  Pulmonary emboli ([place]) - diagnosed at [facility] on CTA on 09/14/20; also has left popliteal DVT (near occlusive) on 09/15/20 duplex -Etiology considered due to hypercoagulable state in setting of active malignancy and prolonged immobility -She was considered reasonable candidate for Eliquis and was evaluated by neurosurgery as well (low risk for brain hemorrhage given time from surgery) -Continue Eliquis  Adrenal insufficiency, primary ([place]) - continue hydrocortisone; discharge summary from [facility] reviewed  DVT (deep venous thrombosis) ([place]) - see PE - continue Eliquis   Renal mass - per [facility] d/c summary: "CT with 1.2 cm left renal mass, recommend MRI for further characterization, however unclear if this would be informative in setting of known metastatic NSCLC. Patient to follow this up as outpatient."  Ankle pain - xray negative  for acute fractures  - symptomatic management   Pressure ulcer of coccygeal region, stage 3 ([place]) -Evaluated by [place] RN, appreciate assistance -Continue dressing changes per [facility]OC[facility] RN: Cleanse coccyx wound with NS and apply piece of calcium  alginate to wound bed.  Sacral foam dressing to coccyx area.  Change every 3 days and as needed for soiling  Lung cancer with cerebellar metastases s/p craniotomy 08/02/20 at [facility] -She will continue outpatient follow-up with oncology. -Patient is high risk for further decline  - PCM has spoke with family; currently GOC are to continue full measures at this time  Type II diabetes mellitus with renal manifestations ([place]) - continue SSI and CBG monitoring   Hypertension - stable off meds at this time   Episode of shaking-resolved as of 10/11/2020 - see CVA - continue keppra   Old records reviewed in assessment of this patient  Antimicrobials: n/a  DVT prophylaxis:  apixaban (ELIQUIS) tablet 5 mg   Code Status:   Code Status: Full Code Family Communication:   Disposition Plan: Status is: Inpatient  Remains inpatient appropriate because:Altered mental status, Unsafe d/c plan and Inpatient level of care appropriate due to severity of illness   Dispo: The patient is from: SNF              Anticipated d/c is to: SNF              Patient currently is not medically stable to d/c.   Difficult to place patient No   Risk of unplanned readmission score: Unplanned Admission- Pilot do not use: 25.62   Objective: Blood pressure 122/74, pulse 94, temperature 97.9 F (36.6 C), temperature source Oral, resp. rate 16, height 5\' 4"  (1.626 m), weight 86 kg, SpO2 96 %.  Examination: General appearance: Chronically ill-appearing elderly woman laying in bed in no obvious distress Head: Normocephalic, without obvious abnormality, atraumatic Eyes: EOMI, PERRL Lungs: clear to auscultation bilaterally Heart: regular rate and rhythm and S1, S2 normal Abdomen: normal findings: bowel sounds normal and soft, non-tender Extremities: no edema Skin: mobility and turgor normal Neurologic: Follows commands, moves all 4 extremities.  4/5 strength in UE; 3+/5 strength in LE  Consultants:    Nephrology  Neurology   Procedures:   EEG  Data Reviewed: I have personally reviewed following labs and imaging studies Results for orders placed or performed during the hospital encounter of 10/03/20 (from the past 24 hour(s))  Sodium     Status: None   Collection Time: 10/13/20  4:40 PM  Result Value Ref Range   Sodium 136 135 - 145 mmol/L  Glucose, capillary     Status: Abnormal   Collection Time: 10/13/20  4:42 PM  Result Value Ref Range   Glucose-Capillary 237 (H) 70 - 99 mg/dL  Osmolality, urine     Status: Abnormal   Collection Time: 10/13/20  5:27 PM  Result Value Ref Range   Osmolality, Ur 181 (L) 300 - 900 mOsm/kg  Sodium, urine, random     Status: None   Collection Time: 10/13/20  5:27 PM  Result Value Ref Range   Sodium, Ur <10 mmol/L  Glucose, capillary     Status: Abnormal   Collection Time: 10/13/20  8:41 PM  Result Value Ref Range   Glucose-Capillary 130 (H) 70 - 99 mg/dL  Basic metabolic panel     Status: Abnormal   Collection Time: 10/14/20  5:31 AM  Result Value Ref Range   Sodium 144 135 - 145 mmol/L  Potassium 4.0 3.5 - 5.1 mmol/L   Chloride 118 (H) 98 - 111 mmol/L   CO2 23 22 - 32 mmol/L   Glucose, Bld 99 70 - 99 mg/dL   BUN 11 8 - 23 mg/dL   Creatinine, Ser 0.69 0.44 - 1.00 mg/dL   Calcium 8.9 8.9 - 10.3 mg/dL   GFR, Estimated >60 >60 mL/min   Anion gap 3 (L) 5 - 15  CBC with Differential/Platelet     Status: Abnormal   Collection Time: 10/14/20  5:31 AM  Result Value Ref Range   WBC 6.2 4.0 - 10.5 K/uL   RBC 3.35 (L) 3.87 - 5.11 MIL/uL   Hemoglobin 8.7 (L) 12.0 - 15.0 g/dL   HCT 29.3 (L) 36.0 - 46.0 %   MCV 87.5 80.0 - 100.0 fL   MCH 26.0 26.0 - 34.0 pg   MCHC 29.7 (L) 30.0 - 36.0 g/dL   RDW 17.4 (H) 11.5 - 15.5 %   Platelets 380 150 - 400 K/uL   nRBC 0.0 0.0 - 0.2 %   Neutrophils Relative % 51 %   Neutro Abs 3.2 1.7 - 7.7 K/uL   Lymphocytes Relative 34 %   Lymphs Abs 2.1 0.7 - 4.0 K/uL   Monocytes Relative 12 %   Monocytes  Absolute 0.7 0.1 - 1.0 K/uL   Eosinophils Relative 1 %   Eosinophils Absolute 0.1 0.0 - 0.5 K/uL   Basophils Relative 1 %   Basophils Absolute 0.0 0.0 - 0.1 K/uL   Immature Granulocytes 1 %   Abs Immature Granulocytes 0.06 0.00 - 0.07 K/uL  Magnesium     Status: None   Collection Time: 10/14/20  5:31 AM  Result Value Ref Range   Magnesium 2.4 1.7 - 2.4 mg/dL  Glucose, capillary     Status: Abnormal   Collection Time: 10/14/20  8:59 AM  Result Value Ref Range   Glucose-Capillary 106 (H) 70 - 99 mg/dL  Glucose, capillary     Status: Abnormal   Collection Time: 10/14/20 11:29 AM  Result Value Ref Range   Glucose-Capillary 179 (H) 70 - 99 mg/dL    No results found for this or any previous visit (from the past 240 hour(s)).   Radiology Studies: No results found. DG Ankle Complete Right  Final Result    US Venous Img Lower Bilateral (DVT)  Final Result    CT ANGIOGRAM HEAD NECK W WO CONTRAST  Final Result    MR BRAIN WO CONTRAST  Final Result    CT Head Wo Contrast  Final Result      Scheduled Meds: .  stroke: mapping our early stages of recovery book   Does not apply Once  . apixaban  5 mg Oral BID  . aspirin EC  81 mg Oral Daily  . atorvastatin  20 mg Oral Daily  . desmopressin  2 spray Nasal BID  . hydrocortisone  10 mg Oral q AM   And  . hydrocortisone  5 mg Oral Q1500  . insulin aspart  0-5 Units Subcutaneous QHS  . insulin aspart  0-9 Units Subcutaneous TID WC  . insulin glargine  10 Units Subcutaneous BID  . latanoprost  1 drop Both Eyes QHS  . levETIRAcetam  500 mg Oral BID  . potassium chloride  40 mEq Oral BID   PRN Meds: acetaminophen **OR** acetaminophen (TYLENOL) oral liquid 160 mg/5 mL **OR** acetaminophen, LORazepam Continuous Infusions:    LOS: 10 days  Time spent: Greater than 50%  of the 35 minute visit was spent in counseling/coordination of care for the patient as laid out in the A&P.   Dwyane Dee, MD [facility] 10/14/2020, 2:29  PM

## 2020-10-15 DIAGNOSIS — E870 Hyperosmolality and hypernatremia: Secondary | ICD-10-CM | POA: Diagnosis not present

## 2020-10-15 LAB — BASIC METABOLIC PANEL
Anion gap: 6 (ref 5–15)
BUN: 11 mg/dL (ref 8–23)
CO2: 22 mmol/L (ref 22–32)
Calcium: 8.8 mg/dL — ABNORMAL LOW (ref 8.9–10.3)
Chloride: 115 mmol/L — ABNORMAL HIGH (ref 98–111)
Creatinine, Ser: 0.81 mg/dL (ref 0.44–1.00)
GFR, Estimated: >60 mL/min (ref >60)
Glucose, Bld: 102 mg/dL — ABNORMAL HIGH (ref 70–99)
Potassium: 4.3 mmol/L (ref 3.5–5.1)
Sodium: 143 mmol/L (ref 135–145)

## 2020-10-15 LAB — CBC WITH DIFFERENTIAL/PLATELET
Abs Immature Granulocytes: 0.08 10*3/uL — ABNORMAL HIGH (ref 0.00–0.07)
Basophils Absolute: 0 10*3/uL (ref 0.0–0.1)
Basophils Relative: 0 %
Eosinophils Absolute: 0.1 10*3/uL (ref 0.0–0.5)
Eosinophils Relative: 1 %
HCT: 29.1 % — ABNORMAL LOW (ref 36.0–46.0)
Hemoglobin: 8.7 g/dL — ABNORMAL LOW (ref 12.0–15.0)
Immature Granulocytes: 1 %
Lymphocytes Relative: 34 %
Lymphs Abs: 2.3 10*3/uL (ref 0.7–4.0)
MCH: 26.3 pg (ref 26.0–34.0)
MCHC: 29.9 g/dL — ABNORMAL LOW (ref 30.0–36.0)
MCV: 87.9 fL (ref 80.0–100.0)
Monocytes Absolute: 0.8 10*3/uL (ref 0.1–1.0)
Monocytes Relative: 12 %
Neutro Abs: 3.6 10*3/uL (ref 1.7–7.7)
Neutrophils Relative %: 52 %
Platelets: 405 10*3/uL — ABNORMAL HIGH (ref 150–400)
RBC: 3.31 MIL/uL — ABNORMAL LOW (ref 3.87–5.11)
RDW: 17.6 % — ABNORMAL HIGH (ref 11.5–15.5)
WBC: 6.9 10*3/uL (ref 4.0–10.5)
nRBC: 0 % (ref 0.0–0.2)

## 2020-10-15 LAB — GLUCOSE, CAPILLARY
Glucose-Capillary: 102 mg/dL — ABNORMAL HIGH (ref 70–99)
Glucose-Capillary: 247 mg/dL — ABNORMAL HIGH (ref 70–99)
Glucose-Capillary: 105 mg/dL — ABNORMAL HIGH (ref 70–99)
Glucose-Capillary: 119 mg/dL — ABNORMAL HIGH (ref 70–99)

## 2020-10-15 LAB — SODIUM: Sodium: 141 mmol/L (ref 135–145)

## 2020-10-15 LAB — MAGNESIUM: Magnesium: 2.3 mg/dL (ref 1.7–2.4)

## 2020-10-15 NOTE — Progress Notes (Signed)
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 She was sent to the ER this hospitalization after an episode of shaking in her right arm and right face. She underwent MRI brain which showed an acute left precentral gyrus infarct.  There was also concern for seizure activity given her shaking episode and acute CVA with history of metastatic disease s/p craniotomy.  Neurology was consulted and she was started on Keppra in the ER.  EEG was negative for epileptiform activity but focal motor seizures could not be excluded. She was also found to have worsening hypernatremia and deluded polyuria.  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  She was started on D5W and DDAVP.  Nephrology was also consulted. Sodium level slowly improved with treatment and she was able to be discontinued off of dextrose infusion on 10/13/2020.   Interval History:  No events overnight.  Resting in bed in no distress this morning.  Appetite remains appropriate.  Sodium level stable from yesterday.  Discussed with her we will trial off of DDAVP and monitor sodium further overnight.  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   -  started on Keppra per neurology  - history of craniotomy due to cerebellar mets.  Performed at [facility] in January 2022 - CVA work-up with A1c of 7.4, lipid panel with LDL of 22 and HDL of 40, echocardiogram with normal EF and grade 1 diastolic dysfunction.  No other significant abnormality. EEG was negative for any specific epileptiform waves but focal motor seizures cannot be excluded. 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  D5W infusion able to be discontinued -Sodium still stable on 10/15/2020.  Will trial off of DDAVP and monitor sodium level.  If remains stable may possibly be able to discharge on Monday  Pulmonary emboli [facility]) - diagnosed at [facility] on CTA on 09/14/20; also has left popliteal DVT (near occlusive) on 09/15/20 duplex -Etiology considered due to hypercoagulable state in setting of active malignancy and prolonged immobility -She was considered reasonable candidate for Eliquis and was evaluated by neurosurgery as well (low risk for brain hemorrhage given time from surgery) -Continue Eliquis  Adrenal insufficiency, primary ([place]) - continue hydrocortisone; discharge summary from [facility] reviewed  DVT (deep venous thrombosis) ([place]) - see PE - continue Eliquis   Renal mass - per [facility] d/c summary: "CT with 1.2 cm left renal mass, recommend MRI for further characterization, however unclear if this would be informative in setting of known metastatic NSCLC. Patient to follow this up as outpatient."  Ankle pain - xray negative for acute fractures  - symptomatic  management   Pressure ulcer of coccygeal region, stage 3 ([place]) -Evaluated by [place] RN, appreciate assistance -Continue dressing changes per WOCN RN: Cleanse coccyx wound with NS and apply piece of calcium alginate to wound bed.  Sacral foam  dressing to coccyx area.  Change every 3 days and as needed for soiling  Lung cancer with cerebellar metastases s/p craniotomy 08/02/20 at [facility] -She will continue outpatient follow-up with oncology. -Patient is high risk for further decline  - PCM has spoke with family; currently GOC are to continue full measures at this time  Type II diabetes mellitus with renal manifestations ([place]) - continue SSI and CBG monitoring   Hypertension - stable off meds at this time   Episode of shaking-resolved as of 10/11/2020 - see CVA - continue keppra   Old records reviewed in assessment of this patient  Antimicrobials: n/a  DVT prophylaxis:  apixaban (ELIQUIS) tablet 5 mg   Code Status:   Code Status: Full Code Family Communication:   Disposition Plan: Status is: Inpatient  Remains inpatient appropriate because:Altered mental status, Unsafe d/c plan and Inpatient level of care appropriate due to severity of illness   Dispo: The patient is from: SNF              Anticipated d/c is to: SNF              Patient currently is not medically stable to d/c.   Difficult to place patient No   Risk of unplanned readmission score: Unplanned Admission- Pilot do not use: 29.27   Objective: Blood pressure 104/60, pulse (!) 102, temperature (!) 97.5 F (36.4 C), temperature source Oral, resp. rate 18, height 5\' 4"  (1.626 m), weight 86 kg, SpO2 99 %.  Examination: General appearance: Chronically ill-appearing elderly woman laying in bed in no obvious distress Head: Normocephalic, without obvious abnormality, atraumatic Eyes: EOMI, PERRL Lungs: clear to auscultation bilaterally Heart: regular rate and rhythm and S1, S2 normal Abdomen: normal findings: bowel sounds normal and soft, non-tender Extremities: no edema Skin: mobility and turgor normal Neurologic: Follows commands, moves all 4 extremities.  4/5 strength in UE; 3+/5 strength in LE  Consultants:   Nephrology  Neurology    Procedures:   EEG  Data Reviewed: I have personally reviewed following labs and imaging studies Results for orders placed or performed during the hospital encounter of 10/03/20 (from the past 24 hour(s))  Glucose, capillary     Status: Abnormal   Collection Time: 10/14/20  4:20 PM  Result Value Ref Range   Glucose-Capillary 181 (H) 70 - 99 mg/dL  Glucose, capillary     Status: Abnormal   Collection Time: 10/14/20  9:01 PM  Result Value Ref Range   Glucose-Capillary 137 (H) 70 - 99 mg/dL  CBC with Differential/Platelet     Status: Abnormal   Collection Time: 10/15/20  4:43 AM  Result Value Ref Range   WBC 6.9 4.0 - 10.5 K/uL   RBC 3.31 (L) 3.87 - 5.11 MIL/uL   Hemoglobin 8.7 (L) 12.0 - 15.0 g/dL   HCT 29.1 (L) 36.0 - 46.0 %   MCV 87.9 80.0 - 100.0 fL   MCH 26.3 26.0 - 34.0 pg   MCHC 29.9 (L) 30.0 - 36.0 g/dL   RDW 17.6 (H) 11.5 - 15.5 %   Platelets 405 (H) 150 - 400 K/uL   nRBC 0.0 0.0 - 0.2 %   Neutrophils Relative % 52 %   Neutro Abs 3.6 1.7 - 7.7 K/uL  Lymphocytes Relative 34 %   Lymphs Abs 2.3 0.7 - 4.0 K/uL   Monocytes Relative 12 %   Monocytes Absolute 0.8 0.1 - 1.0 K/uL   Eosinophils Relative 1 %   Eosinophils Absolute 0.1 0.0 - 0.5 K/uL   Basophils Relative 0 %   Basophils Absolute 0.0 0.0 - 0.1 K/uL   Immature Granulocytes 1 %   Abs Immature Granulocytes 0.08 (H) 0.00 - 0.07 K/uL  Magnesium     Status: None   Collection Time: 10/15/20  4:43 AM  Result Value Ref Range   Magnesium 2.3 1.7 - 2.4 mg/dL  Basic metabolic panel     Status: Abnormal   Collection Time: 10/15/20  4:43 AM  Result Value Ref Range   Sodium 143 135 - 145 mmol/L   Potassium 4.3 3.5 - 5.1 mmol/L   Chloride 115 (H) 98 - 111 mmol/L   CO2 22 22 - 32 mmol/L   Glucose, Bld 102 (H) 70 - 99 mg/dL   BUN 11 8 - 23 mg/dL   Creatinine, Ser 0.81 0.44 - 1.00 mg/dL   Calcium 8.8 (L) 8.9 - 10.3 mg/dL   GFR, Estimated >60 >60 mL/min   Anion gap 6 5 - 15  Glucose, capillary     Status: Abnormal    Collection Time: 10/15/20  8:07 AM  Result Value Ref Range   Glucose-Capillary 102 (H) 70 - 99 mg/dL  Glucose, capillary     Status: Abnormal   Collection Time: 10/15/20 12:13 PM  Result Value Ref Range   Glucose-Capillary 247 (H) 70 - 99 mg/dL    No results found for this or any previous visit (from the past 240 hour(s)).   Radiology Studies: No results found. DG Ankle Complete Right  Final Result    US Venous Img Lower Bilateral (DVT)  Final Result    CT ANGIOGRAM HEAD NECK W WO CONTRAST  Final Result    MR BRAIN WO CONTRAST  Final Result    CT Head Wo Contrast  Final Result      Scheduled Meds: .  stroke: mapping our early stages of recovery book   Does not apply Once  . apixaban  5 mg Oral BID  . aspirin EC  81 mg Oral Daily  . atorvastatin  20 mg Oral Daily  . hydrocortisone  10 mg Oral q AM   And  . hydrocortisone  5 mg Oral Q1500  . insulin aspart  0-5 Units Subcutaneous QHS  . insulin aspart  0-9 Units Subcutaneous TID WC  . insulin glargine  10 Units Subcutaneous BID  . latanoprost  1 drop Both Eyes QHS  . levETIRAcetam  500 mg Oral BID  . potassium chloride  40 mEq Oral BID   PRN Meds: acetaminophen **OR** acetaminophen (TYLENOL) oral liquid 160 mg/5 mL **OR** acetaminophen, LORazepam Continuous Infusions:    LOS: 11 days  Time spent: Greater than 50% of the 35 minute visit was spent in counseling/coordination of care for the patient as laid out in the A&P.   Dwyane Dee, MD [facility] 10/15/2020, 12:22 PM

## 2020-10-16 DIAGNOSIS — E870 Hyperosmolality and hypernatremia: Secondary | ICD-10-CM | POA: Diagnosis not present

## 2020-10-16 LAB — CBC WITH DIFFERENTIAL/PLATELET
Abs Immature Granulocytes: 0.07 10*3/uL (ref 0.00–0.07)
Basophils Absolute: 0 10*3/uL (ref 0.0–0.1)
Basophils Relative: 1 %
Eosinophils Absolute: 0.1 10*3/uL (ref 0.0–0.5)
Eosinophils Relative: 1 %
HCT: 30.4 % — ABNORMAL LOW (ref 36.0–46.0)
Hemoglobin: 9.4 g/dL — ABNORMAL LOW (ref 12.0–15.0)
Immature Granulocytes: 1 %
Lymphocytes Relative: 34 %
Lymphs Abs: 2.9 10*3/uL (ref 0.7–4.0)
MCH: 26.6 pg (ref 26.0–34.0)
MCHC: 30.9 g/dL (ref 30.0–36.0)
MCV: 85.9 fL (ref 80.0–100.0)
Monocytes Absolute: 1 10*3/uL (ref 0.1–1.0)
Monocytes Relative: 11 %
Neutro Abs: 4.6 10*3/uL (ref 1.7–7.7)
Neutrophils Relative %: 52 %
Platelets: 423 10*3/uL — ABNORMAL HIGH (ref 150–400)
RBC: 3.54 MIL/uL — ABNORMAL LOW (ref 3.87–5.11)
RDW: 17.8 % — ABNORMAL HIGH (ref 11.5–15.5)
WBC: 8.6 10*3/uL (ref 4.0–10.5)
nRBC: 0 % (ref 0.0–0.2)

## 2020-10-16 LAB — GLUCOSE, CAPILLARY
Glucose-Capillary: 116 mg/dL — ABNORMAL HIGH (ref 70–99)
Glucose-Capillary: 136 mg/dL — ABNORMAL HIGH (ref 70–99)

## 2020-10-16 LAB — BASIC METABOLIC PANEL
Anion gap: 8 (ref 5–15)
BUN: 15 mg/dL (ref 8–23)
CO2: 21 mmol/L — ABNORMAL LOW (ref 22–32)
Calcium: 8.9 mg/dL (ref 8.9–10.3)
Chloride: 112 mmol/L — ABNORMAL HIGH (ref 98–111)
Creatinine, Ser: 0.7 mg/dL (ref 0.44–1.00)
GFR, Estimated: >60 mL/min (ref >60)
Glucose, Bld: 119 mg/dL — ABNORMAL HIGH (ref 70–99)
Potassium: 4.1 mmol/L (ref 3.5–5.1)
Sodium: 141 mmol/L (ref 135–145)

## 2020-10-16 LAB — RESP PANEL BY RT-PCR (FLU A&B, COVID) ARPGX2
Influenza A by PCR: NEGATIVE
Influenza B by PCR: NEGATIVE
SARS Coronavirus 2 by RT PCR: NEGATIVE

## 2020-10-16 LAB — MAGNESIUM: Magnesium: 2.4 mg/dL (ref 1.7–2.4)

## 2020-10-16 MED ORDER — INSULIN GLARGINE 100 UNIT/ML ~~LOC~~ SOLN: 10.0000 [IU] | Freq: Two times a day (BID) | SUBCUTANEOUS | 11 refills | Status: AC

## 2020-10-16 MED ORDER — ATORVASTATIN CALCIUM 20 MG PO TABS: 20.0000 mg | ORAL_TABLET | Freq: Every day | ORAL | Status: AC

## 2020-10-16 MED ORDER — LEVETIRACETAM 500 MG PO TABS
500.0000 mg | ORAL_TABLET | Freq: Two times a day (BID) | ORAL | Status: AC
Start: 2020-10-16 — End: ?

## 2020-10-16 MED ORDER — HYDROCORTISONE 10 MG PO TABS: 10.0000 mg | ORAL_TABLET | Freq: Every morning | ORAL | Status: AC

## 2020-10-16 MED ORDER — HYDROCORTISONE 5 MG PO TABS: 5.0000 mg | ORAL_TABLET | Freq: Every day | ORAL | Status: AC

## 2020-10-16 NOTE — TOC Transition Note (Signed)
Transition of Care [facility]) - CM/SW Discharge Note   Patient Details  Name: Cheyenne Anthony MRN: [id number] Date of Birth: 04-24-1946  [facility]) CM/SW Contact:  Shelbie Hutching, RN Phone Number: 10/16/2020, 12:29 PM   Clinical Narrative:    Patient medically cleared for discharge to [facility] today.  Patient will be going back to room 601A.  [facility] notified of discharge today.  Bedside RN will call report to [phone].  RNCM will arrange EMS transport.     Final next level of care: Skilled Nursing Facility Barriers to Discharge: Barriers Resolved   Patient Goals and CMS Choice Patient states their goals for this hospitalization and ongoing recovery are:: [facility] will [facility], ultimate family goal is for patient to be able to return home at some point CMS Medicare.gov Compare Post Acute Care list provided to:: Patient Represenative (must comment) Choice offered to / list presented to : Adult Children  Discharge Placement   Existing PASRR number confirmed : 10/11/20          Patient chooses bed at: [facility] [place] Patient to be transferred to facility by: [facility] Name of family member notified: [name] Patient and family notified of of transfer: 10/16/20  Discharge Plan and Services   Discharge Planning Services: CM Consult Post Acute Care Choice: [place]          DME Arranged: N/A DME Agency: NA       HH Arranged: NA          Social Determinants of Health (SDOH) Interventions     Readmission Risk Interventions Readmission Risk Prevention Plan 10/11/2020  Transportation Screening Complete  PCP or Specialist Appt within 3-5 Days Complete  HRI or [place] Complete  Social Work Consult for [place] Planning/Counseling Complete  Palliative Care Screening Complete  Medication Review Press photographer) Complete  Some recent data might be hidden

## 2020-10-16 NOTE — Discharge Summary (Signed)
Physician Discharge Summary   [place] [facility] EKC:[id number] DOB: [birth year] DOA: 10/03/2020  PCP: [place] date: 10/03/2020 Discharge date:    Admitted From: [facility] Disposition:  same Discharging physician: Dwyane Dee, MD  Recommendations for Outpatient Follow-up:  1. Insulin may need further adjusting due to intermittent low CBGs.  Lantus was already decreased prior to discharge 2. See renal mass discussion below 3. [facility] discharge summary reviewed from recent hospitalization.  Resumed on hydrocortisone for adrenal insufficiency 4. May need repeat BMP in 2 to 3 days to ensure stability of sodium.  Patient encouraged for good oral intake. HCTZ discontinued as well as KCL supplements   Patient discharged to SNF in Discharge Condition: stable Risk of unplanned readmission score: Unplanned Admission- Pilot do not use: 26.05  CODE STATUS: Full Diet recommendation:  Diet Orders (From admission, onward)    Start     Ordered   10/09/20 1700  DIET DYS 2 Room service appropriate? Yes with Assist; Fluid consistency: Thin  Diet effective now       Comments: No milk. Hot tea with lemon and honey with each lunch and dinner tray.  NO STRAWS!  Cream Soups at meals.  Question Answer Comment  Room service appropriate? Yes with Assist   Fluid consistency: Thin      10/09/20 1659          Hospital Course: Ms. 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 She was sent to the ER this hospitalization after an episode of shaking in her right arm and right face. She underwent MRI brain which  showed an acute left precentral gyrus infarct.  There was also concern for seizure activity given her shaking episode and acute CVA with history of metastatic disease s/p craniotomy.  Neurology was consulted and she was started on Keppra in the ER.  EEG was negative for epileptiform activity but focal motor seizures could not be excluded. She was also found to have worsening hypernatremia and deluded polyuria.  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  She was started on D5W and DDAVP.  Nephrology was also consulted. Sodium level slowly improved with treatment and she was able to be discontinued off of dextrose infusion on 10/13/2020. DDAVP was discontinued on 10/15/2020 and sodium again remained stable.  She was further monitored again overnight with again sodium levels remaining unchanged and was considered stable for discharging back to her SNF.  Of note, she is to remain on hydrocortisone for her history of adrenal insufficiency per [facility] discharge summary after review.  She was also started on Keppra per neurology which should be continued as noted above.  Glucose levels were slightly low at times and her Lantus dose was decreased.  She will need further CBG monitoring and possible further adjustment of insulin.   * Acute/ subacute CVA  on CT head - MRI brain noted with acute precentral gyrus CVA.   - started on Keppra per neurology  - history of craniotomy due to cerebellar mets.  Performed at [facility] in January 2022 - CVA work-up with A1c of 7.4, lipid panel with LDL of 22 and HDL of 40, echocardiogram with  normal EF and grade 1 diastolic dysfunction.  No other significant abnormality. EEG was negative for any specific epileptiform waves but focal motor seizures cannot be excluded.  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   D5W infusion able to be discontinued -Sodium still stable on 10/15/2020.  Will trial off of DDAVP and monitor sodium level.  If remains stable may possibly be able to discharge on Monday -Sodium remaining stable on 10/16/2020.  Okay for discharge  Pulmonary emboli [facility]) - diagnosed at [facility] on CTA on 09/14/20; also has left popliteal DVT (near occlusive) on 09/15/20 duplex -Etiology considered due to hypercoagulable state in setting of active malignancy and prolonged immobility -She was considered reasonable candidate for Eliquis and was evaluated by neurosurgery as well (low risk for brain hemorrhage given time from surgery) -Continue Eliquis  Adrenal insufficiency, primary ([place]) - continue hydrocortisone; discharge summary from [facility] reviewed  DVT (deep venous thrombosis) ([place]) - see PE - continue Eliquis   Renal mass - per [facility] d/c summary: "CT with 1.2 cm left renal mass, recommend MRI for further characterization, however unclear if this would be informative in setting of known metastatic NSCLC. Patient to follow this up as outpatient."  Ankle pain - xray negative for acute fractures  - symptomatic management   Pressure ulcer of coccygeal region, stage 3 ([place]) -Evaluated by [place] RN, appreciate assistance -Continue dressing changes per [facility] RN: Cleanse coccyx wound with NS and apply piece of calcium alginate to wound bed.  Sacral foam dressing to coccyx area.  Change every 3 days and as needed for soiling  Lung cancer with cerebellar metastases s/p craniotomy 08/02/20 at [facility] -She will continue outpatient follow-up with oncology. -Patient is high risk for further decline  - PCM has spoke with family; currently GOC are to continue full measures at this time  Type II diabetes mellitus with renal manifestations ([place]) - continue SSI and CBG monitoring   Hypertension - stable off meds at this time   Episode of shaking-resolved as of 10/11/2020 - see CVA - continue  keppra    Principal Diagnosis: Acute CVA (cerebrovascular accident) [facility])  Discharge Diagnoses: Active Hospital Problems   Diagnosis Date Noted  . Acute/ subacute CVA  on CT head 08/20/2020    Priority: High  . Pulmonary emboli ([place]) 10/11/2020    Priority: Medium  . Adrenal insufficiency, primary ([place]) 10/05/2020    Priority: Low  . Ankle pain 10/11/2020  . Renal mass 10/11/2020  . DVT (deep venous thrombosis) ([place]) 10/11/2020  . Pressure ulcer of coccygeal region, stage 3 ([place]) 10/04/2020  . Lung cancer with cerebellar metastases s/p craniotomy 08/02/20 at [facility] 08/20/2020  . Hypertension   . Type II diabetes mellitus with renal manifestations [facility])     Resolved Hospital Problems   Diagnosis Date Noted Date Resolved  . Hypernatremia 10/03/2020 10/16/2020    Priority: High  . Episode of shaking 10/03/2020 10/11/2020    Discharge Instructions    Discharge wound care:   Complete by: As directed    Calcium alginate and sacral foam to coccyx, change every 3 days or as needed for soilage   Increase activity slowly   Complete by: As directed      Allergies as of 10/16/2020   No Known Allergies     Medication List    STOP taking these medications   Acetaminophen Extra Strength 500 MG  tablet Generic drug: acetaminophen   lisinopril-hydrochlorothiazide 20-25 MG tablet Commonly known as: ZESTORETIC   metoprolol succinate 50 MG 24 hr tablet Commonly known as: TOPROL-XL   MiraLax 17 g packet Generic drug: polyethylene glycol   NIFEdipine 90 MG 24 hr tablet Commonly known as: ADALAT CC   potassium chloride SA 20 MEQ tablet Commonly known as: KLOR-CON   traMADol 50 MG tablet Commonly known as: ULTRAM     TAKE these medications   apixaban 5 MG Tabs tablet Commonly known as: ELIQUIS Take 5 mg by mouth every 12 (twelve) hours.   atorvastatin 20 MG tablet Commonly known as: LIPITOR Take 1 tablet (20 mg total) by mouth daily. Start taking on: October 17, 2020 What  changed:   medication strength  how much to take   DULoxetine 60 MG capsule Commonly known as: CYMBALTA Take 60 mg by mouth daily.   hydrocortisone 5 MG tablet Commonly known as: CORTEF Take 1 tablet (5 mg total) by mouth daily in the afternoon. What changed:   when to take this  additional instructions   hydrocortisone 10 MG tablet Commonly known as: CORTEF Take 1 tablet (10 mg total) by mouth in the morning. Start taking on: October 17, 2020 What changed:   when to take this  additional instructions   insulin glargine 100 UNIT/ML injection Commonly known as: LANTUS Inject 0.1 mLs (10 Units total) into the skin 2 (two) times daily.   latanoprost 0.005 % ophthalmic solution Commonly known as: XALATAN Place 1 drop into both eyes at bedtime.   levETIRAcetam 500 MG tablet Commonly known as: KEPPRA Take 1 tablet (500 mg total) by mouth 2 (two) times daily.   senna-docusate 8.6-50 MG tablet Commonly known as: Senokot-S Take 2 tablets by mouth in the morning and at bedtime.   sitaGLIPtin-metformin 50-500 MG tablet Commonly known as: JANUMET Take 1 tablet by mouth in the morning and at bedtime.            Discharge Care Instructions  (From admission, onward)         Start     Ordered   10/16/20 0000  Discharge wound care:       Comments: Calcium alginate and sacral foam to coccyx, change every 3 days or as needed for soilage   10/16/20 1219          No Known Allergies  Consultations: Neuro  Discharge Exam: BP 140/76 (BP Location: Left Arm)   Pulse 85   Temp 98 F (36.7 C)   Resp 16   Ht 5\' 4"  (1.626 m)   Wt 86 kg   SpO2 100%   BMI 32.54 kg/m  General appearance: Chronically ill-appearing elderly woman laying in bed in no obvious distress Head: Normocephalic, without obvious abnormality, atraumatic Eyes: EOMI, PERRL Lungs: clear to auscultation bilaterally Heart: regular rate and rhythm and S1, S2 normal Abdomen: normal findings: bowel  sounds normal and soft, non-tender Extremities: no edema MSK: Pain in lower extremities with passive ROM Skin: mobility and turgor normal Neurologic: Follows commands, moves all 4 extremities.  4/5 strength in UE; 3+/5 strength in LE  The results of significant diagnostics from this hospitalization (including imaging, microbiology, ancillary and laboratory) are listed below for reference.   Microbiology: Recent Results (from the past 240 hour(s))  Resp Panel by RT-PCR (Flu A&B, Covid) Nasopharyngeal Swab     Status: None   Collection Time: 10/16/20  9:37 AM   Specimen: Nasopharyngeal Swab; Nasopharyngeal(NP) swabs in vial transport  medium  Result Value Ref Range Status   SARS Coronavirus 2 by RT PCR NEGATIVE NEGATIVE Final    Comment: (NOTE) SARS-CoV-2 target nucleic acids are NOT DETECTED.  The SARS-CoV-2 RNA is generally detectable in upper respiratory specimens during the acute phase of infection. The lowest concentration of SARS-CoV-2 viral copies this assay can detect is 138 copies/mL. A negative result does not preclude SARS-Cov-2 infection and should not be used as the sole basis for treatment or other patient management decisions. A negative result may occur with  improper specimen collection/handling, submission of specimen other than nasopharyngeal swab, presence of viral mutation(s) within the areas targeted by this assay, and inadequate number of viral copies(<138 copies/mL). A negative result must be combined with clinical observations, patient history, and epidemiological information. The expected result is Negative.  Fact Sheet for Patients:  EntrepreneurPulse.com.au  Fact Sheet for Healthcare Providers:  IncredibleEmployment.be  This test is no t yet approved or cleared by the [place] FDA and  has been authorized for detection and/or diagnosis of SARS-CoV-2 by FDA under an Emergency Use Authorization (EUA). This EUA will  remain  in effect (meaning this test can be used) for the duration of the COVID-19 declaration under Section 564(b)(1) of the Act, 21 U.S.C.section 360bbb-3(b)(1), unless the authorization is terminated  or revoked sooner.       Influenza A by PCR NEGATIVE NEGATIVE Final   Influenza B by PCR NEGATIVE NEGATIVE Final    Comment: (NOTE) The Xpert Xpress SARS-CoV-2/FLU/RSV plus assay is intended as an aid in the diagnosis of influenza from Nasopharyngeal swab specimens and should not be used as a sole basis for treatment. Nasal washings and aspirates are unacceptable for Xpert Xpress SARS-CoV-2/FLU/RSV testing.  Fact Sheet for Patients: EntrepreneurPulse.com.au  Fact Sheet for Healthcare Providers: IncredibleEmployment.be  This test is not yet approved or cleared by the [place] FDA and has been authorized for detection and/or diagnosis of SARS-CoV-2 by FDA under an Emergency Use Authorization (EUA). This EUA will remain in effect (meaning this test can be used) for the duration of the COVID-19 declaration under Section 564(b)(1) of the Act, 21 U.S.C. section 360bbb-3(b)(1), unless the authorization is terminated or revoked.  Performed at [facility], [address] [place], [place] [address]      Labs: BNP (last 3 results) Recent Labs    07/31/20 1110  BNP 858.8*   Basic Metabolic Panel: Recent Labs  Lab 10/12/20 0627 10/13/20 0419 10/13/20 1307 10/13/20 1640 10/14/20 0531 10/15/20 0443 10/15/20 1549 10/16/20 0611  NA 150* 143 138 136 144 143 141 141  K 3.7 4.4 4.3  --  4.0 4.3  --  4.1  CL 119* 115* 111  --  118* 115*  --  112*  CO2 24 21* 22  --  23 22  --  21*  GLUCOSE 172* 172* 155*  --  99 102*  --  119*  BUN 10 15 13   --  11 11  --  15  CREATININE 0.80 0.76 0.73  --  0.69 0.81  --  0.70  CALCIUM 9.0 8.9 8.5*  --  8.9 8.8*  --  8.9  MG 2.6* 2.5*  --   --  2.4 2.3  --  2.4   Liver Function Tests: No  results for input(s): AST, ALT, ALKPHOS, BILITOT, PROT, ALBUMIN in the last 168 hours. No results for input(s): LIPASE, AMYLASE in the last 168 hours. No results for input(s): AMMONIA in the last 168  hours. CBC: Recent Labs  Lab 10/12/20 0627 10/13/20 0419 10/14/20 0531 10/15/20 0443 10/16/20 0611  WBC 7.0 7.9 6.2 6.9 8.6  NEUTROABS 3.7 4.1 3.2 3.6 4.6  HGB 9.1* 9.1* 8.7* 8.7* 9.4*  HCT 31.5* 31.1* 29.3* 29.1* 30.4*  MCV 88.5 88.4 87.5 87.9 85.9  PLT 357 374 380 405* 423*   Cardiac Enzymes: No results for input(s): CKTOTAL, CKMB, CKMBINDEX, TROPONINI in the last 168 hours. BNP: Invalid input(s): POCBNP CBG: Recent Labs  Lab 10/15/20 1213 10/15/20 1619 10/15/20 2026 10/16/20 0743 10/16/20 1143  GLUCAP 247* 105* 119* 116* 136*   D-Dimer No results for input(s): DDIMER in the last 72 hours. Hgb A1c No results for input(s): HGBA1C in the last 72 hours. Lipid Profile No results for input(s): CHOL, HDL, LDLCALC, TRIG, CHOLHDL, LDLDIRECT in the last 72 hours. Thyroid function studies No results for input(s): TSH, T4TOTAL, T3FREE, THYROIDAB in the last 72 hours.  Invalid input(s): FREET3 Anemia work up No results for input(s): VITAMINB12, FOLATE, FERRITIN, TIBC, IRON, RETICCTPCT in the last 72 hours. Urinalysis    Component Value Date/Time   COLORURINE COLORLESS (A) 10/04/2020 0415   APPEARANCEUR CLEAR (A) 10/04/2020 0415   LABSPEC 1.006 10/04/2020 0415   PHURINE 6.0 10/04/2020 0415   GLUCOSEU NEGATIVE 10/04/2020 0415   HGBUR SMALL (A) 10/04/2020 0415   BILIRUBINUR NEGATIVE 10/04/2020 0415   KETONESUR NEGATIVE 10/04/2020 0415   PROTEINUR NEGATIVE 10/04/2020 0415   NITRITE NEGATIVE 10/04/2020 0415   LEUKOCYTESUR NEGATIVE 10/04/2020 0415   Sepsis Labs Invalid input(s): PROCALCITONIN,  WBC,  LACTICIDVEN Microbiology Recent Results (from the past 240 hour(s))  Resp Panel by RT-PCR (Flu A&B, Covid) Nasopharyngeal Swab     Status: None   Collection Time: 10/16/20   9:37 AM   Specimen: Nasopharyngeal Swab; Nasopharyngeal(NP) swabs in vial transport medium  Result Value Ref Range Status   SARS Coronavirus 2 by RT PCR NEGATIVE NEGATIVE Final    Comment: (NOTE) SARS-CoV-2 target nucleic acids are NOT DETECTED.  The SARS-CoV-2 RNA is generally detectable in upper respiratory specimens during the acute phase of infection. The lowest concentration of SARS-CoV-2 viral copies this assay can detect is 138 copies/mL. A negative result does not preclude SARS-Cov-2 infection and should not be used as the sole basis for treatment or other patient management decisions. A negative result may occur with  improper specimen collection/handling, submission of specimen other than nasopharyngeal swab, presence of viral mutation(s) within the areas targeted by this assay, and inadequate number of viral copies(<138 copies/mL). A negative result must be combined with clinical observations, patient history, and epidemiological information. The expected result is Negative.  Fact Sheet for Patients:  EntrepreneurPulse.com.au  Fact Sheet for Healthcare Providers:  IncredibleEmployment.be  This test is no t yet approved or cleared by the [place] FDA and  has been authorized for detection and/or diagnosis of SARS-CoV-2 by FDA under an Emergency Use Authorization (EUA). This EUA will remain  in effect (meaning this test can be used) for the duration of the COVID-19 declaration under Section 564(b)(1) of the Act, 21 U.S.C.section 360bbb-3(b)(1), unless the authorization is terminated  or revoked sooner.       Influenza A by PCR NEGATIVE NEGATIVE Final   Influenza B by PCR NEGATIVE NEGATIVE Final    Comment: (NOTE) The Xpert Xpress SARS-CoV-2/FLU/RSV plus assay is intended as an aid in the diagnosis of influenza from Nasopharyngeal swab specimens and should not be used as a sole basis for treatment. Nasal washings and aspirates  are unacceptable for Xpert Xpress SARS-CoV-2/FLU/RSV testing.  Fact Sheet for Patients: EntrepreneurPulse.com.au  Fact Sheet for Healthcare Providers: IncredibleEmployment.be  This test is not yet approved or cleared by the [place] FDA and has been authorized for detection and/or diagnosis of SARS-CoV-2 by FDA under an Emergency Use Authorization (EUA). This EUA will remain in effect (meaning this test can be used) for the duration of the COVID-19 declaration under Section 564(b)(1) of the Act, 21 U.S.C. section 360bbb-3(b)(1), unless the authorization is terminated or revoked.  Performed at [facility], [address] [place], [place] [address]     Procedures/Studies: CT [facility] HEAD NECK W WO CONTRAST  Result Date: 10/03/2020 CLINICAL DATA:  Stroke follow-up. Right arm shaking. EXAM: CT ANGIOGRAPHY HEAD AND NECK TECHNIQUE: Multidetector CT imaging of the head and neck was performed using the standard protocol during bolus administration of intravenous contrast. Multiplanar CT image reconstructions and MIPs were obtained to evaluate the vascular anatomy. Carotid stenosis measurements (when applicable) are obtained utilizing NASCET criteria, using the distal internal carotid diameter as the denominator. CONTRAST:  72mL OMNIPAQUE IOHEXOL 350 MG/ML SOLN COMPARISON:  None. FINDINGS: CTA NECK FINDINGS SKELETON: There is no bony spinal canal stenosis. No lytic or blastic lesion. OTHER NECK: Normal pharynx, larynx and major salivary glands. No cervical lymphadenopathy. Unremarkable thyroid gland. UPPER CHEST: No pneumothorax or pleural effusion. No nodules or masses. AORTIC ARCH: There is calcific atherosclerosis of the aortic arch. There is no aneurysm, dissection or hemodynamically significant stenosis of the visualized portion of the aorta. Conventional 3 vessel aortic branching pattern. The visualized proximal subclavian arteries are widely  patent. RIGHT CAROTID SYSTEM: No dissection, occlusion or aneurysm. There is predominantly calcified atherosclerosis extending into the proximal ICA, resulting in approximately 70% stenosis. LEFT CAROTID SYSTEM: No dissection, occlusion or aneurysm. There is calcific atherosclerosis extending into the proximal ICA, resulting in less than 50% stenosis. VERTEBRAL ARTERIES: Right dominant configuration. Both origins are clearly patent. There is no dissection, occlusion or flow-limiting stenosis to the skull base (V1-V3 segments). CTA HEAD FINDINGS POSTERIOR CIRCULATION: --Vertebral arteries: Normal V4 segments. --Inferior cerebellar arteries: Normal. --Basilar artery: Normal. --Superior cerebellar arteries: Normal. --Posterior cerebral arteries (PCA): Normal. ANTERIOR CIRCULATION: --Intracranial internal carotid arteries: Atherosclerotic calcification of the internal carotid arteries at the skull base with mild-to-moderate stenosis worse at the distal right cavernous segment. --Anterior cerebral arteries (ACA): Normal. Both A1 segments are present. Patent anterior communicating artery (a-comm). --Middle cerebral arteries (MCA): Normal. VENOUS SINUSES: As permitted by contrast timing, patent. ANATOMIC VARIANTS: None Review of the MIP images confirms the above findings. IMPRESSION: 1. No intracranial arterial occlusion or high-grade stenosis. 2. 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 Aortic Atherosclerosis (ICD10-I70.0). Electronically Signed   By: Uly[place] Jarred M.D.   On: 10/03/2020 23:49   DG Ankle Complete Right  Result Date: 10/05/2020 CLINICAL DATA:  Right ankle pain. EXAM: RIGHT ANKLE - COMPLETE 3+ VIEW COMPARISON:  No prior. FINDINGS: Diffuse soft tissue swelling. Severe diffuse degenerative changes. Corticated small bony densities noted about the right tibiotalar joint consistent with loose bodies. No evidence of acute  fracture or dislocation. Dystrophic soft tissue calcifications noted. No radiopaque foreign body. IMPRESSION: Diffuse soft tissue swelling. Severe diffuse degenerative changes. Corticated small bony densities noted about the right tibiotalar joint consistent with loose bodies. No evidence of acute fracture or dislocation. Electronically Signed   By: [place]   On: 10/05/2020 10:40   CT Head Wo  Contrast  Result Date: 10/03/2020 CLINICAL DATA:  Shaking, history of brain tumor EXAM: CT HEAD WITHOUT CONTRAST TECHNIQUE: Contiguous axial images were obtained from the base of the skull through the vertex without intravenous contrast. COMPARISON:  08/19/2020 FINDINGS: Brain: Chronic right MCA territory infarction involving the frontal lobe and insula. There is gyriform hyperdensity in the right occipital lobe. Hypoattenuation likely reflecting surgical cavity is present in the right cerebellum underlying craniectomy and cranioplasty without mass effect. Limited evaluation for tumor on this study. Prominence of the ventricles and sulci reflects generalized parenchymal volume loss. Additional patchy hypoattenuation in the supratentorial white matter is nonspecific but probably reflects stable chronic microvascular ischemic changes. Vascular: No hyperdense vessel.There is atherosclerotic calcification at the skull base. Skull: Right lateral suboccipital craniectomy with cranioplasty. Small right frontal burr hole. Sinuses/Orbits: No acute finding. Other: None. IMPRESSION: Gyriform hyperdensity in the right occipital lobe likely reflecting interval, now subacute infarction with laminar necrosis or petechial hemorrhage. Postoperative changes in the posterior fossa. 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 changes. Electronically Signed   By: Macy Mis M.D.   On: 10/03/2020 19:03   MR BRAIN WO CONTRAST  Result Date: 10/03/2020 CLINICAL DATA:  Tremor EXAM: MRI HEAD WITHOUT CONTRAST  TECHNIQUE: Multiplanar, multiecho pulse sequences of the brain and surrounding structures were obtained without intravenous contrast. COMPARISON:  08/20/2020 FINDINGS: Brain: There is a small acute infarct within the left frontal lobe along the left precentral gyrus. There are old infarcts of the right frontal operculum and right occipital lobe. There are postsurgical changes of right cerebellum. Chronic blood products at the right cerebellum There is multifocal hyperintense T2-weighted signal within the white matter. Generalized volume loss without a clear lobar predilection. The midline structures are normal. Vascular: Major flow voids are preserved. Skull and upper cervical spine: Right retromastoid cranioplasty. Sinuses/Orbits:No paranasal sinus fluid levels or advanced mucosal thickening. No mastoid or middle ear effusion. Normal orbits. IMPRESSION: 1. Small acute infarct of the left precentral gyrus. No hemorrhage or mass effect. 2. Old infarcts of the right frontal operculum and right occipital lobe. Electronically Signed   By: Ulyses Jarred M.D.   On: 10/03/2020 21:43   US Venous Img Lower Bilateral (DVT)  Result Date: 10/04/2020 CLINICAL DATA:  History of DVT EXAM: BILATERAL LOWER EXTREMITY VENOUS DOPPLER ULTRASOUND TECHNIQUE: Gray-scale sonography with graded compression, as well as color Doppler and duplex ultrasound were performed to evaluate the lower extremity deep venous systems from the level of the common femoral vein and including the common femoral, femoral, profunda femoral, popliteal and calf veins including the posterior tibial, peroneal and gastrocnemius veins when visible. The superficial great saphenous vein was also interrogated. Spectral Doppler was utilized to evaluate flow at rest and with distal augmentation maneuvers in the common femoral, femoral and popliteal veins. COMPARISON:  None. FINDINGS: RIGHT LOWER EXTREMITY Common Femoral Vein: No evidence of thrombus. Normal  compressibility, respiratory phasicity and response to augmentation. Saphenofemoral Junction: No evidence of thrombus. Normal compressibility and flow on color Doppler imaging. Profunda Femoral Vein: No evidence of thrombus. Normal compressibility and flow on color Doppler imaging. Femoral Vein: No evidence of thrombus. Normal compressibility, respiratory phasicity and response to augmentation. Popliteal Vein: No evidence of thrombus. Normal compressibility, respiratory phasicity and response to augmentation. Calf Veins: No evidence of thrombus. Normal compressibility and flow on color Doppler imaging. Superficial Great Saphenous Vein: No evidence of thrombus. Normal compressibility. Venous Reflux:  None. Other Findings:  None. LEFT LOWER EXTREMITY Common Femoral Vein: No  evidence of thrombus. Normal compressibility, respiratory phasicity and response to augmentation. Saphenofemoral Junction: No evidence of thrombus. Normal compressibility and flow on color Doppler imaging. Profunda Femoral Vein: No evidence of thrombus. Normal compressibility and flow on color Doppler imaging. Femoral Vein: No evidence of thrombus. Normal compressibility, respiratory phasicity and response to augmentation. Popliteal Vein: No evidence of thrombus. Normal compressibility, respiratory phasicity and response to augmentation. Calf Veins: No evidence of thrombus. Normal compressibility and flow on color Doppler imaging. Superficial Great Saphenous Vein: No evidence of thrombus. Normal compressibility. Venous Reflux:  None. Other Findings:  None. IMPRESSION: No evidence of deep venous thrombosis in either lower extremity. Electronically Signed   By: Inez Catalina M.D.   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 . EEG to evaluate for seizure  Level of alertness:  lethargic  AEDs during EEG study: LEV  Technical aspects: This EEG study was done with scalp electrodes positioned according to the 10-20 International system of electrode placement. Electrical activity was acquired at a sampling rate of 500Hz  and reviewed with a high frequency filter of 70Hz  and a low frequency filter of 1Hz . EEG data were recorded continuously and digitally stored.  Description: No posterior dominant rhythm was seen. EEG showed continuous generalized 3 to 6 Hz theta-delta slowing. Photic driving was not seen during photic stimulation.  Hyperventilation was not performed.    ABNORMALITY - Continuous slow, generalized  IMPRESSION: This study is suggestive of moderate diffuse encephalopathy, nonspecific etiology. No seizures and epileptiform discharges were seen during this study Lora Havens   EEG adult  Result Date: 10/04/2020 Lora Havens, MD     10/04/2020  3:47 PM Patient Name: Meena Barrantes MRN: [id number] Epilepsy Attending: Lora Havens Referring Physician/Provider: Dr Rosalin Hawking Date: 10/04/2020 Duration: 38.25 mins Patient history: 75 y.o. 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 . EEG to evaluate for seizure Level of alertness:  lethargic AEDs during EEG study: LEV Technical aspects: This EEG study was done with scalp electrodes positioned according to the 10-20 International system of electrode placement. Electrical activity was acquired at a sampling rate of 500Hz  and reviewed with a high frequency filter of 70Hz  and a low frequency filter of 1Hz . EEG data were recorded  continuously and digitally stored. Description: No posterior dominant rhythm was seen. EEG showed continuous generalized 3 to 6 Hz theta-delta slowing.  Hyperventilation and photic stimulation were not performed.   Patient was also noted to have right>left head and neck twithcing per eeg technician ( not able to visualize on camera) during eeg. Concomitant eeg  before, during and after the event didn't show any eeg change to suggest seizure ABNORMALITY - Continuous slow, generalized IMPRESSION: This study is suggestive of moderate diffuse encephalopathy, nonspecific etiology. Patient was also noted to have right>left head and neck twitching without concomitant eeg change and therefore most likely not epileptic. However, focal motor seizure may not seen on scalp eeg . Therefore, clinical correlation is  recommended. Dr Erlinda Hong ws notified. Lora Havens   ECHOCARDIOGRAM COMPLETE  Result Date: 10/04/2020    ECHOCARDIOGRAM REPORT   Patient Name:   SALLY-ANNE WAMBLE Date of Exam: 10/04/2020 Medical Rec #:  [id number]   Height:       64.0 in Accession #:    [id number]  Weight:       187.0 lb Date of Birth:  1945/09/15   BSA:          1.901 m Patient Age:    75 years    BP:           111/86 mmHg Patient Gender: F           HR:           105 bpm. Exam Location:  [facility] Procedure: 2D Echo, Cardiac Doppler and Color Doppler Indications:     Stroke I63.9  History:         Patient has no prior history of Echocardiogram examinations.                  Risk Factors:Hypertension and Diabetes.  Sonographer:     Sherrie Sport RDCS (AE) Referring Phys:  [id number] Rosalin Hawking Diagnosing Phys: Kate Sable MD  Sonographer Comments: Suboptimal apical window. IMPRESSIONS  1. Left ventricular ejection fraction, by estimation, is 60 to 65%. The left ventricle has normal function. The left ventricle has no regional wall motion abnormalities. Left ventricular diastolic parameters are consistent with Grade I diastolic dysfunction (impaired relaxation).   2. Right ventricular systolic function is normal. The right ventricular size is normal.  3. The mitral valve is normal in structure. No evidence of mitral valve regurgitation.  4. The aortic valve is tricuspid. Aortic valve regurgitation is not visualized. Mild to moderate aortic valve sclerosis/calcification is present, without any evidence of aortic stenosis.  5. The inferior vena cava is normal in size with greater than 50% respiratory variability, suggesting right atrial pressure of 3 mmHg. FINDINGS  Left Ventricle: Left ventricular ejection fraction, by estimation, is 60 to 65%. The left ventricle has normal function. The left ventricle has no regional wall motion abnormalities. The left ventricular internal cavity size was normal in size. There is  no left ventricular hypertrophy. Left ventricular diastolic parameters are consistent with Grade I diastolic dysfunction (impaired relaxation). Right Ventricle: The right ventricular size is normal. No increase in right ventricular wall thickness. Right ventricular systolic function is normal. Left Atrium: Left atrial size was normal in size. Right Atrium: Right atrial size was normal in size. Pericardium: There is no evidence of pericardial effusion. Mitral Valve: The mitral valve is normal in structure. No evidence of mitral valve regurgitation. Tricuspid Valve: The tricuspid valve is normal in structure. Tricuspid valve regurgitation is trivial. Aortic Valve: The aortic valve is tricuspid. Aortic valve regurgitation is not visualized. Mild to moderate aortic valve sclerosis/calcification is present, without any evidence of aortic stenosis. Aortic valve mean gradient measures 4.0 mmHg. Aortic valve peak gradient measures 6.1 mmHg. Aortic valve area, by VTI measures 4.20 cm. Pulmonic Valve: The pulmonic valve was normal in structure. Pulmonic valve regurgitation is not visualized. Aorta: The aortic root is normal in size and structure. Venous: The inferior vena  cava is normal in size with greater than 50% respiratory variability, suggesting right atrial pressure of 3 mmHg. IAS/Shunts: No atrial level shunt detected by color flow Doppler.  LEFT VENTRICLE PLAX 2D LVIDd:         3.45 cm  Diastology LVIDs:  2.04 cm  LV e' medial:    5.33 cm/s LV PW:         1.60 cm  LV E/e' medial:  9.2 LV IVS:        1.27 cm  LV e' lateral:   10.20 cm/s LVOT diam:     2.00 cm  LV E/e' lateral: 4.8 LV SV:         59 LV SV Index:   31 LVOT Area:     3.14 cm  RIGHT VENTRICLE RV Basal diam:  3.66 cm LEFT ATRIUM           Index       RIGHT ATRIUM           Index LA diam:      2.40 cm 1.26 cm/m  RA Area:     18.80 cm LA Vol (A2C): 22.0 ml 11.57 ml/m RA Volume:   49.20 ml  25.88 ml/m LA Vol (A4C): 41.1 ml 21.62 ml/m  AORTIC VALVE                   PULMONIC VALVE AV Area (Vmax):    2.94 cm    PV Vmax:        1.11 m/s AV Area (Vmean):   2.95 cm    PV Peak grad:   4.9 mmHg AV Area (VTI):     4.20 cm    RVOT Peak grad: 6 mmHg AV Vmax:           123.00 cm/s AV Vmean:          88.900 cm/s AV VTI:            0.140 m AV Peak Grad:      6.1 mmHg AV Mean Grad:      4.0 mmHg LVOT Vmax:         115.00 cm/s LVOT Vmean:        83.600 cm/s LVOT VTI:          0.187 m LVOT/AV VTI ratio: 1.34  AORTA Ao Root diam: 2.40 cm MITRAL VALVE               TRICUSPID VALVE MV Area (PHT): 7.29 cm    TR Peak grad:   26.0 mmHg MV Decel Time: 104 msec    TR Vmax:        255.00 cm/s MV E velocity: 49.30 cm/s MV A velocity: 81.40 cm/s  SHUNTS MV E/A ratio:  0.61        Systemic VTI:  0.19 m                            Systemic Diam: 2.00 cm Kate Sable MD Electronically signed by Kate Sable MD Signature Date/Time: 10/04/2020/4:54:49 PM    Final      Time coordinating discharge: Over 30 minutes    Dwyane Dee, MD  [facility] 10/16/2020, 12:22 PM

## 2020-10-16 NOTE — Progress Notes (Signed)
Daughter Kimbery Harwood, called and notified when patient was picked up by EMS

## 2020-10-16 NOTE — Progress Notes (Signed)
Report called to SNF

## 2020-10-16 NOTE — Care Management Important Message (Signed)
Important Message  Patient Details  Name: Ivry Pigue MRN: [id number] Date of Birth: [birth year]   Medicare Important Message Given:  Yes  HCPOA [name] Jimmye Norman was in the patient's room and I reviewed the Important Message from Medicare with them both.  She stated she understood the contents of the form and I thanked them for their time.  Juliann Pulse A Taija Mathias 10/16/2020, 2:28 PM

## 2020-10-16 NOTE — TOC Progression Note (Signed)
Transition of Care [facility]) - Progression Note    Patient Details  Name: Cheyenne Anthony MRN: [id number] Date of Birth: 27-Dec-1945  [facility]) CM/SW Contact  Shelbie Hutching, RN Phone Number: 10/16/2020, 12:56 PM  Clinical Narrative:    EMS has been arranged they gave no ETA.    Expected Discharge Plan: Skilled Nursing Facility Barriers to Discharge: Barriers Resolved  Expected Discharge Plan and Services Expected Discharge Plan: [place]   Discharge Planning Services: CM Consult Post Acute Care Choice: [place] Living arrangements for the past 2 months: [place] Expected Discharge Date: 10/16/20               DME Arranged: N/A DME Agency: NA       HH Arranged: NA           Social Determinants of Health (SDOH) Interventions    Readmission Risk Interventions Readmission Risk Prevention Plan 10/11/2020  Transportation Screening Complete  PCP or Specialist Appt within 3-5 Days Complete  HRI or [place] Complete  Social Work Consult for [place] Planning/Counseling Complete  Palliative Care Screening Complete  Medication Review [employer]) Complete  Some recent data might be hidden

## 2021-01-22 ENCOUNTER — Emergency Department: Payer: Medicare (Managed Care)

## 2021-01-22 ENCOUNTER — Other Ambulatory Visit: Payer: Self-pay

## 2021-01-22 ENCOUNTER — Encounter: Payer: Self-pay | Admitting: Emergency Medicine

## 2021-01-22 ENCOUNTER — Inpatient Hospital Stay
Admission: EM | Admit: 2021-01-22 | Discharge: 2021-01-24 | DRG: 637 | Disposition: A | Discharge status: Home / Self Care | Payer: Medicare (Managed Care) | Attending: Internal Medicine | Admitting: Internal Medicine

## 2021-01-22 DIAGNOSIS — Z8673 Personal history of transient ischemic attack (TIA), and cerebral infarction without residual deficits: Secondary | ICD-10-CM

## 2021-01-22 DIAGNOSIS — J9610 Chronic respiratory failure, unspecified whether with hypoxia or hypercapnia: Secondary | ICD-10-CM | POA: Diagnosis present

## 2021-01-22 DIAGNOSIS — E876 Hypokalemia: Secondary | ICD-10-CM | POA: Diagnosis present

## 2021-01-22 DIAGNOSIS — Z823 Family history of stroke: Secondary | ICD-10-CM

## 2021-01-22 DIAGNOSIS — I119 Hypertensive heart disease without heart failure: Secondary | ICD-10-CM | POA: Diagnosis present

## 2021-01-22 DIAGNOSIS — I6389 Other cerebral infarction: Secondary | ICD-10-CM | POA: Diagnosis present

## 2021-01-22 DIAGNOSIS — E11649 Type 2 diabetes mellitus with hypoglycemia without coma: Principal | ICD-10-CM | POA: Diagnosis present

## 2021-01-22 DIAGNOSIS — Z794 Long term (current) use of insulin: Secondary | ICD-10-CM

## 2021-01-22 DIAGNOSIS — R29810 Facial weakness: Secondary | ICD-10-CM | POA: Diagnosis present

## 2021-01-22 DIAGNOSIS — E785 Hyperlipidemia, unspecified: Secondary | ICD-10-CM | POA: Diagnosis present

## 2021-01-22 DIAGNOSIS — Z7984 Long term (current) use of oral hypoglycemic drugs: Secondary | ICD-10-CM

## 2021-01-22 DIAGNOSIS — Z7901 Long term (current) use of anticoagulants: Secondary | ICD-10-CM

## 2021-01-22 DIAGNOSIS — J9811 Atelectasis: Secondary | ICD-10-CM | POA: Diagnosis present

## 2021-01-22 DIAGNOSIS — D630 Anemia in neoplastic disease: Secondary | ICD-10-CM | POA: Diagnosis present

## 2021-01-22 DIAGNOSIS — C7931 Secondary malignant neoplasm of brain: Secondary | ICD-10-CM | POA: Diagnosis present

## 2021-01-22 DIAGNOSIS — E162 Hypoglycemia, unspecified: Secondary | ICD-10-CM | POA: Diagnosis present

## 2021-01-22 DIAGNOSIS — C3491 Malignant neoplasm of unspecified part of right bronchus or lung: Secondary | ICD-10-CM | POA: Diagnosis present

## 2021-01-22 DIAGNOSIS — Z9981 Dependence on supplemental oxygen: Secondary | ICD-10-CM

## 2021-01-22 DIAGNOSIS — G9341 Metabolic encephalopathy: Secondary | ICD-10-CM | POA: Diagnosis present

## 2021-01-22 DIAGNOSIS — R414 Neurologic neglect syndrome: Secondary | ICD-10-CM | POA: Diagnosis present

## 2021-01-22 DIAGNOSIS — E870 Hyperosmolality and hypernatremia: Secondary | ICD-10-CM | POA: Diagnosis present

## 2021-01-22 DIAGNOSIS — Z86711 Personal history of pulmonary embolism: Secondary | ICD-10-CM

## 2021-01-22 DIAGNOSIS — Z20822 Contact with and (suspected) exposure to covid-19: Secondary | ICD-10-CM | POA: Diagnosis present

## 2021-01-22 DIAGNOSIS — Z79899 Other long term (current) drug therapy: Secondary | ICD-10-CM

## 2021-01-22 DIAGNOSIS — E869 Volume depletion, unspecified: Secondary | ICD-10-CM | POA: Diagnosis present

## 2021-01-22 DIAGNOSIS — Z87891 Personal history of nicotine dependence: Secondary | ICD-10-CM

## 2021-01-22 DIAGNOSIS — R4182 Altered mental status, unspecified: Secondary | ICD-10-CM

## 2021-01-22 LAB — CBG MONITORING, ED
Glucose-Capillary: 35 mg/dL — CL (ref 70–99)
Glucose-Capillary: 38 mg/dL — CL (ref 70–99)
Glucose-Capillary: 132 mg/dL — ABNORMAL HIGH (ref 70–99)
Glucose-Capillary: 119 mg/dL — ABNORMAL HIGH (ref 70–99)
Glucose-Capillary: 115 mg/dL — ABNORMAL HIGH (ref 70–99)
Glucose-Capillary: 178 mg/dL — ABNORMAL HIGH (ref 70–99)
Glucose-Capillary: 214 mg/dL — ABNORMAL HIGH (ref 70–99)

## 2021-01-22 LAB — CBC WITH DIFFERENTIAL/PLATELET
Abs Immature Granulocytes: 0.05 10*3/uL (ref 0.00–0.07)
Basophils Absolute: 0 10*3/uL (ref 0.0–0.1)
Basophils Relative: 1 %
Eosinophils Absolute: 0.1 10*3/uL (ref 0.0–0.5)
Eosinophils Relative: 1 %
HCT: 28.6 % — ABNORMAL LOW (ref 36.0–46.0)
Hemoglobin: 8.3 g/dL — ABNORMAL LOW (ref 12.0–15.0)
Immature Granulocytes: 1 %
Lymphocytes Relative: 20 %
Lymphs Abs: 1.2 10*3/uL (ref 0.7–4.0)
MCH: 23.4 pg — ABNORMAL LOW (ref 26.0–34.0)
MCHC: 29 g/dL — ABNORMAL LOW (ref 30.0–36.0)
MCV: 80.6 fL (ref 80.0–100.0)
Monocytes Absolute: 0.5 10*3/uL (ref 0.1–1.0)
Monocytes Relative: 9 %
Neutro Abs: 4.3 10*3/uL (ref 1.7–7.7)
Neutrophils Relative %: 68 %
Platelets: 556 10*3/uL — ABNORMAL HIGH (ref 150–400)
RBC: 3.55 MIL/uL — ABNORMAL LOW (ref 3.87–5.11)
RDW: 18.5 % — ABNORMAL HIGH (ref 11.5–15.5)
WBC: 6.2 10*3/uL (ref 4.0–10.5)
nRBC: 0 % (ref 0.0–0.2)

## 2021-01-22 LAB — COMPREHENSIVE METABOLIC PANEL
ALT: 7 U/L (ref 0–44)
AST: 18 U/L (ref 15–41)
Albumin: 2.3 g/dL — ABNORMAL LOW (ref 3.5–5.0)
Alkaline Phosphatase: 73 U/L (ref 38–126)
Anion gap: 6 (ref 5–15)
BUN: 10 mg/dL (ref 8–23)
CO2: 28 mmol/L (ref 22–32)
Calcium: 8.7 mg/dL — ABNORMAL LOW (ref 8.9–10.3)
Chloride: 105 mmol/L (ref 98–111)
Creatinine, Ser: 0.55 mg/dL (ref 0.44–1.00)
GFR, Estimated: >60 mL/min (ref >60)
Glucose, Bld: 122 mg/dL — ABNORMAL HIGH (ref 70–99)
Potassium: 3 mmol/L — ABNORMAL LOW (ref 3.5–5.1)
Sodium: 139 mmol/L (ref 135–145)
Total Bilirubin: 0.5 mg/dL (ref 0.3–1.2)
Total Protein: 6.4 g/dL — ABNORMAL LOW (ref 6.5–8.1)

## 2021-01-22 LAB — URINALYSIS, COMPLETE (UACMP) WITH MICROSCOPIC
Bilirubin Urine: NEGATIVE
Glucose, UA: NEGATIVE mg/dL
Ketones, ur: NEGATIVE mg/dL
Nitrite: NEGATIVE
Protein, ur: NEGATIVE mg/dL
Specific Gravity, Urine: 1.001 — ABNORMAL LOW (ref 1.005–1.030)
pH: 7 (ref 5.0–8.0)

## 2021-01-22 LAB — TROPONIN I (HIGH SENSITIVITY)
Troponin I (High Sensitivity): 25 ng/L — ABNORMAL HIGH (ref <18)
Troponin I (High Sensitivity): 21 ng/L — ABNORMAL HIGH (ref <18)

## 2021-01-22 LAB — RESP PANEL BY RT-PCR (FLU A&B, COVID) ARPGX2
Influenza A by PCR: NEGATIVE
Influenza B by PCR: NEGATIVE
SARS Coronavirus 2 by RT PCR: NEGATIVE

## 2021-01-22 LAB — MRSA NEXT GEN BY PCR, NASAL: MRSA by PCR Next Gen: NOT DETECTED

## 2021-01-22 LAB — GLUCOSE, CAPILLARY: Glucose-Capillary: 143 mg/dL — ABNORMAL HIGH (ref 70–99)

## 2021-01-22 LAB — PROCALCITONIN: Procalcitonin: 65.06 ng/mL

## 2021-01-22 LAB — LACTIC ACID, PLASMA: Lactic Acid, Venous: 1.5 mmol/L (ref 0.5–1.9)

## 2021-01-22 MED ORDER — KCL IN DEXTROSE-NACL 20-5-0.45 MEQ/L-%-% IV SOLN
INTRAVENOUS | Status: DC
  Filled 2021-01-22: qty 1000

## 2021-01-22 MED ORDER — DEXTROSE 50 % IV SOLN: 1.0000 | Freq: Once | INTRAVENOUS | Status: AC

## 2021-01-22 MED ORDER — TRAZODONE HCL 50 MG PO TABS
25.0000 mg | ORAL_TABLET | Freq: Every evening | ORAL | Status: DC | PRN
  Administered 2021-01-22: 21:00:00 25 mg via ORAL
  Filled 2021-01-22: qty 1

## 2021-01-22 MED ORDER — ACETAMINOPHEN 650 MG RE SUPP: 650.0000 mg | Freq: Four times a day (QID) | RECTAL | Status: DC | PRN

## 2021-01-22 MED ORDER — HYDROCORTISONE 10 MG PO TABS
10.0000 mg | ORAL_TABLET | Freq: Every morning | ORAL | Status: DC
  Administered 2021-01-22 – 2021-01-24 (×3): 10 mg via ORAL
  Filled 2021-01-22 (×4): qty 1

## 2021-01-22 MED ORDER — DULOXETINE HCL 30 MG PO CPEP
60.0000 mg | ORAL_CAPSULE | Freq: Every day | ORAL | Status: DC
  Administered 2021-01-22 – 2021-01-24 (×3): 60 mg via ORAL
  Filled 2021-01-22 (×2): qty 2
  Filled 2021-01-22: qty 1

## 2021-01-22 MED ORDER — SODIUM CHLORIDE 0.9 % IV SOLN: 2.0000 g | INTRAVENOUS | Status: DC

## 2021-01-22 MED ORDER — ENOXAPARIN SODIUM 40 MG/0.4ML IJ SOSY: 40.0000 mg | PREFILLED_SYRINGE | INTRAMUSCULAR | Status: DC

## 2021-01-22 MED ORDER — ATORVASTATIN CALCIUM 20 MG PO TABS
20.0000 mg | ORAL_TABLET | Freq: Every day | ORAL | Status: DC
  Administered 2021-01-22 – 2021-01-24 (×3): 20 mg via ORAL
  Filled 2021-01-22 (×3): qty 1

## 2021-01-22 MED ORDER — INSULIN ASPART 100 UNIT/ML IJ SOLN
0.0000 [IU] | Freq: Three times a day (TID) | INTRAMUSCULAR | Status: DC
  Administered 2021-01-22: 3 [IU] via SUBCUTANEOUS
  Administered 2021-01-22: 1 [IU] via SUBCUTANEOUS
  Administered 2021-01-22 – 2021-01-23 (×3): 2 [IU] via SUBCUTANEOUS
  Administered 2021-01-23: 1 [IU] via SUBCUTANEOUS
  Administered 2021-01-24: 2 [IU] via SUBCUTANEOUS
  Filled 2021-01-22 (×7): qty 1

## 2021-01-22 MED ORDER — POTASSIUM CHLORIDE 10 MEQ/100ML IV SOLN
10.0000 meq | Freq: Once | INTRAVENOUS | Status: AC
  Administered 2021-01-22: 10 meq via INTRAVENOUS
  Filled 2021-01-22: qty 100

## 2021-01-22 MED ORDER — VANCOMYCIN HCL IN DEXTROSE 1-5 GM/200ML-% IV SOLN
1000.0000 mg | INTRAVENOUS | Status: DC
  Administered 2021-01-23: 1000 mg via INTRAVENOUS
  Filled 2021-01-22 (×3): qty 200

## 2021-01-22 MED ORDER — LATANOPROST 0.005 % OP SOLN
1.0000 [drp] | Freq: Every day | OPHTHALMIC | Status: DC
  Administered 2021-01-22 – 2021-01-23 (×2): 1 [drp] via OPHTHALMIC
  Filled 2021-01-22: qty 2.5

## 2021-01-22 MED ORDER — ONDANSETRON HCL 4 MG PO TABS
4.0000 mg | ORAL_TABLET | Freq: Four times a day (QID) | ORAL | Status: DC | PRN
Start: 2021-01-22 — End: 2021-01-24

## 2021-01-22 MED ORDER — LEVETIRACETAM 500 MG PO TABS
500.0000 mg | ORAL_TABLET | Freq: Two times a day (BID) | ORAL | Status: DC
  Administered 2021-01-22 – 2021-01-24 (×5): 500 mg via ORAL
  Filled 2021-01-22 (×6): qty 1

## 2021-01-22 MED ORDER — APIXABAN 5 MG PO TABS
5.0000 mg | ORAL_TABLET | Freq: Two times a day (BID) | ORAL | Status: DC
  Administered 2021-01-22 – 2021-01-24 (×4): 5 mg via ORAL
  Filled 2021-01-22 (×4): qty 1

## 2021-01-22 MED ORDER — VANCOMYCIN HCL IN DEXTROSE 1-5 GM/200ML-% IV SOLN: 1000.0000 mg | Freq: Two times a day (BID) | INTRAVENOUS | Status: DC

## 2021-01-22 MED ORDER — ACETAMINOPHEN 325 MG PO TABS: 650.0000 mg | ORAL_TABLET | Freq: Four times a day (QID) | ORAL | Status: DC | PRN

## 2021-01-22 MED ORDER — MAGNESIUM HYDROXIDE 400 MG/5ML PO SUSP
30.0000 mL | Freq: Every day | ORAL | Status: DC | PRN
  Filled 2021-01-22: qty 30

## 2021-01-22 MED ORDER — DEXTROSE-NACL 5-0.45 % IV SOLN: INTRAVENOUS | Status: DC

## 2021-01-22 MED ORDER — VANCOMYCIN HCL 1750 MG/350ML IV SOLN
1750.0000 mg | Freq: Once | INTRAVENOUS | Status: AC
  Administered 2021-01-22: 1750 mg via INTRAVENOUS
  Filled 2021-01-22: qty 350

## 2021-01-22 MED ORDER — HYDROCORTISONE NA SUCCINATE PF 100 MG IJ SOLR
100.0000 mg | Freq: Once | INTRAMUSCULAR | Status: AC
  Administered 2021-01-22: 100 mg via INTRAVENOUS
  Filled 2021-01-22: qty 2

## 2021-01-22 MED ORDER — SODIUM CHLORIDE 0.9 % IV SOLN
2.0000 g | Freq: Three times a day (TID) | INTRAVENOUS | Status: DC
  Administered 2021-01-22 – 2021-01-24 (×6): 2 g via INTRAVENOUS
  Filled 2021-01-22 (×9): qty 2

## 2021-01-22 MED ORDER — HYDROCORTISONE 5 MG PO TABS
5.0000 mg | ORAL_TABLET | Freq: Every day | ORAL | Status: DC
  Administered 2021-01-23: 17:00:00 5 mg via ORAL
  Filled 2021-01-22 (×3): qty 1

## 2021-01-22 MED ORDER — SENNOSIDES-DOCUSATE SODIUM 8.6-50 MG PO TABS: 2.0000 | ORAL_TABLET | Freq: Every evening | ORAL | Status: DC | PRN

## 2021-01-22 MED ORDER — POTASSIUM CHLORIDE 20 MEQ PO PACK
40.0000 meq | PACK | Freq: Once | ORAL | Status: AC
  Administered 2021-01-22: 40 meq via ORAL
  Filled 2021-01-22: qty 2

## 2021-01-22 MED ORDER — DEXTROSE 50 % IV SOLN
INTRAVENOUS | Status: AC
  Administered 2021-01-22: 50 mL via INTRAVENOUS
  Filled 2021-01-22: qty 50

## 2021-01-22 MED ORDER — DEXTROSE-NACL 10-0.45 % IV SOLN
INTRAVENOUS | Status: DC
  Filled 2021-01-22 (×3): qty 1000

## 2021-01-22 MED ORDER — ONDANSETRON HCL 4 MG/2ML IJ SOLN: 4.0000 mg | Freq: Four times a day (QID) | INTRAMUSCULAR | Status: DC | PRN

## 2021-01-22 NOTE — ED Notes (Signed)
Informed RN bed assigned 

## 2021-01-22 NOTE — ED Triage Notes (Signed)
Pt arrived via [facility] from home with call out of AMS. EMS reports CBG of 29 and D10 hung in route with positive results on mentation. Family reported pt hx/o stage 4 brain, kidney and lung cancer. Pt able to answer all questions asked by MD at bedside with ease.

## 2021-01-22 NOTE — Consult Note (Signed)
Pharmacy Antibiotic Note  Cheyenne Anthony is a 75 y.o. female admitted on 01/22/2021 with pneumonia.  Pharmacy has been consulted for Vancomycin dosing.  Plan: Will give Vancomycin 1750mg  x 1 as a loading dose, followed by  Vancomycin 1000 mg IV Q 24 hrs.  Goal AUC 400-550. Expected AUC: 450 SCr used: 0.8  Will adjust Cefepime from 2g q24 to 2g q8h per protocol and renal function  Weight: 91.4 kg (201 lb 8 oz)  Temp (24hrs), Avg:97.7 F (36.5 C), Min:97.7 F (36.5 C), Max:97.7 F (36.5 C)  Recent Labs  Lab 01/22/21 0305  WBC 6.2  CREATININE 0.55  LATICACIDVEN 1.5    Estimated Creatinine Clearance: 67.6 mL/min (by C-G formula based on SCr of 0.55 mg/dL).    No Known Allergies  Antimicrobials this admission: Vancomycin 6/27 >>  Cefepime 6/27 >>   Dose adjustments this admission:   Microbiology results: 6/27 BCx: pending 6/27 UCx: pending  COVID/FLU NEG 6/28 MRSA PCR: pending  Thank you for allowing pharmacy to be a part of this patient's care.  Lu Duffel, PharmD, BCPS Clinical Pharmacist 01/22/2021 10:05 AM

## 2021-01-22 NOTE — H&P (Signed)
Cheyenne Anthony   PATIENT NAME: Cheyenne Anthony    MR#:  [id number]  DATE OF BIRTH:  10-22-1945  DATE OF ADMISSION:  01/22/2021  PRIMARY CARE PHYSICIAN: [place]   Patient is coming from: Home  REQUESTING/REFERRING PHYSICIAN: Lurline Hare, MD  CHIEF COMPLAINT:   Chief Complaint  Patient presents with  . Hypoglycemia    HISTORY OF PRESENT ILLNESS:  Cheyenne Anthony is a 75 y.o. 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  The patient was found to have an initial blood glucose of 29.  She is normally alert and conversive at baseline.  On route to the hospital he was given D10W.  He was able to answer simple questions and follow simple commands but his blood glucose was still in the 30s upon arrival.  She was given an amp of D50 and started on D10 half-normal saline.  No nausea or vomiting or abdominal pain.  No chest pain or dyspnea or cough.  No dysuria, oliguria or hematuria or flank pain.  She has chronic respiratory failure on home O2 at 2 L/min.  ED Course: When he came to the ER vital signs were within normal, except for pulse oximetry that was 96% on 2 L of O2 by nasal cannula.  Labs revealed hypokalemia of 3 and blood glucose of 122 postoperative 6.4 albumin 2.3.  High-sensitivity troponin was 25 and lactic acid 1.5 with a procalcitonin of 65.  CBC showed anemia lower than previous levels with decreased RBC indices and thrombocytosis with platelets of 556 higher than previous levels.  COVID-19 PCR and influenza antigens came back negative.  Blood cultures were drawn.    EKG as reviewed by me : N showed no sinus rhythm with rate of 82 with poor R wave progression and nonspecific intraventricular  conduction delay with left axis deviation. Imaging: Portable chest x-ray showed enlarging mass at the right lung base with left basal atelectasis or infiltrate and cardiomegaly.  The patient was given 100 mg IV Solu-Cortef, 10 mEq IV potassium chloride twice, IV D50 as mentioned above followed by D10 half-normal saline 100 mill per hour.  She will be admitted to a medical monitored bed for further evaluation and management. PAST MEDICAL HISTORY:   Past Medical History:  Diagnosis Date  . Adrenal insufficiency, primary ([place]) 10/05/2020  . Brain mass 07/31/2020  . Cancer ([place])   . Hypertension   . Right lower lobe lung mass   . Type II diabetes mellitus with renal manifestations ([place])     PAST SURGICAL HISTORY:  History reviewed. No pertinent surgical history.  SOCIAL HISTORY:   Social History   Tobacco Use  . Smoking status: Former    Pack years: 0.00    Types: Cigarettes    Quit date: 07/31/2020    Years since quitting: 0.4  . Smokeless tobacco: Never  Substance Use Topics  . Alcohol use: Not Currently    Comment: occ    FAMILY HISTORY:   Family History  Problem Relation Age of Onset  . Stroke Brother     DRUG ALLERGIES:  No Known Allergies  REVIEW OF SYSTEMS:   ROS As per history of present illness. All pertinent systems were reviewed above. Constitutional, HEENT, cardiovascular, respiratory, GI, GU, musculoskeletal, neuro, psychiatric,  endocrine, integumentary and hematologic systems were reviewed and are otherwise negative/unremarkable except for positive findings mentioned above in the HPI.   MEDICATIONS AT HOME:   Prior to Admission medications   Medication Sig Start Date End Date Taking? Authorizing Provider  apixaban (ELIQUIS) 5 MG TABS tablet Take 5 mg by mouth every 12 (twelve) hours. 09/25/20   [provider]  atorvastatin (LIPITOR) 20 MG tablet Take 1 tablet (20 mg total) by mouth daily. 10/17/20   Dwyane Dee, MD  DULoxetine (CYMBALTA) 60  MG capsule Take 60 mg by mouth daily.    [provider]  hydrocortisone (CORTEF) 10 MG tablet Take 1 tablet (10 mg total) by mouth in the morning. 10/17/20   Dwyane Dee, MD  hydrocortisone (CORTEF) 5 MG tablet Take 1 tablet (5 mg total) by mouth daily in the afternoon. 10/16/20   Dwyane Dee, MD  insulin glargine (LANTUS) 100 UNIT/ML injection Inject 0.1 mLs (10 Units total) into the skin 2 (two) times daily. 10/16/20   Dwyane Dee, MD  latanoprost (XALATAN) 0.005 % ophthalmic solution Place 1 drop into both eyes at bedtime.    [provider]  levETIRAcetam (KEPPRA) 500 MG tablet Take 1 tablet (500 mg total) by mouth 2 (two) times daily. 10/16/20   Dwyane Dee, MD  senna-docusate (SENOKOT-S) 8.6-50 MG tablet Take 2 tablets by mouth in the morning and at bedtime. 08/09/20   [provider]  sitaGLIPtin-metformin (JANUMET) 50-500 MG tablet Take 1 tablet by mouth in the morning and at bedtime.    [provider]      VITAL SIGNS:  Blood pressure 136/80, pulse 81, temperature 97.7 F (36.5 C), temperature source Oral, resp. rate 18, SpO2 96 %.  PHYSICAL EXAMINATION:  Physical Exam  GENERAL:  75 y.o.-year-old African-American female patient lying in the bed with no acute distress.  She is alert but rarely answers questions. EYES: Pupils equal, round, reactive to light and accommodation. No scleral icterus. Extraocular muscles intact.  HEENT: Head atraumatic, normocephalic. Oropharynx and nasopharynx clear.  NECK:  Supple, no jugular venous distention. No thyroid enlargement, no tenderness.  LUNGS: Normal breath sounds bilaterally, no wheezing, rales,rhonchi or crepitation. No use of accessory muscles of respiration.  CARDIOVASCULAR: Regular rate and rhythm, S1, S2 normal. No murmurs, rubs, or gallops.  ABDOMEN: Soft, nondistended, nontender. Bowel sounds present. No organomegaly or mass.  EXTREMITIES: No pedal edema, cyanosis, or clubbing.   NEUROLOGIC: Cranial nerves II through XII are intact. Muscle strength 5/5 in all extremities. Sensation intact. Gait not checked.  PSYCHIATRIC: The patient is alert and oriented x 3.  Normal affect and good eye contact. SKIN: No obvious rash, lesion, or ulcer.   LABORATORY PANEL:   CBC Recent Labs  Lab 01/22/21 0305  WBC 6.2  HGB 8.3*  HCT 28.6*  PLT 556*   ------------------------------------------------------------------------------------------------------------------  Chemistries  Recent Labs  Lab 01/22/21 0305  NA 139  K 3.0*  CL 105  CO2 28  GLUCOSE 122*  BUN 10  CREATININE 0.55  CALCIUM 8.7*  AST 18  ALT 7  ALKPHOS 73  BILITOT 0.5   ------------------------------------------------------------------------------------------------------------------  Cardiac Enzymes No results for input(s): TROPONINI in the last 168 hours. ------------------------------------------------------------------------------------------------------------------  RADIOLOGY:  CT Head Wo Contrast  Result Date: 01/22/2021 CLINICAL DATA:  Altered mental status EXAM: CT HEAD WITHOUT CONTRAST TECHNIQUE: Contiguous axial images were obtained from the base of the skull through the vertex without intravenous contrast. 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 No significant associated mass effect or superimposed hemorrhage identified. Encephalomalacia related to remote right frontal MCA territory cortical infarct again noted. There has been interval evolution of the a right occipital lobe cortical infarct with progressive encephalomalacia in this region. Mild parenchymal volume loss is commensurate with the patient's age. Moderate periventricular white matter changes are again identified likely reflecting the sequela of small vessel ischemia. No abnormal intra or extra-axial mass lesion. No acute intracranial hemorrhage no abnormal  mass effect or midline shift. Ventricular size is normal. Remote right suboccipital craniotomy has been performed with associated cephalo malacia within the right cerebellar hemisphere again noted. Vascular: No asymmetric hyperdense vasculature at the skull base. Skull: No acute fracture. Sinuses/Orbits: The visualized paranasal sinuses are clear. The orbits are unremarkable. Other: The mastoid air cells and middle ear cavities are clear. IMPRESSION: Interval development of a subacute cortical infarct involving the left precentral gyrus with no superimposed hemorrhage or significant associated mass effect. Interval evolution of remote right occipital cortical infarct. Stable encephalomalacia within the right cerebellar hemisphere and right frontal lobe with surgical changes of prior right suboccipital craniotomy again identified. Stable senescent changes. Electronically Signed   By: Fidela Salisbury MD   On: 01/22/2021 04:23   DG Chest Port 1 View  Result Date: 01/22/2021 CLINICAL DATA:  Altered mental status EXAM: PORTABLE CHEST 1 VIEW COMPARISON:  08/19/2020 FINDINGS: Masslike opacity in the right lung base has increased significantly in size since prior study. Increasing airspace opacity at the left base could reflect atelectasis or infiltrate. Heart mildly enlarged. No edema or effusions. IMPRESSION: Enlarging mass at the right lung base. Left base atelectasis or infiltrate. Cardiomegaly. Electronically Signed   By: Rolm Baptise M.D.   On: 01/22/2021 03:14      IMPRESSION AND PLAN:  Active Problems:   Hypoglycemia  1.  Acute metabolic cephalopathy likely secondary to hypoglycemia with type 2 diabetes mellitus. - The patient will be admitted to a medical monitored bed. - We will hold off her Lantus and Janumet. - She will be continued on D5 half-normal saline for now. - We will place her on supplemental coverage with sensitive level and monitor blood glucose measures.  2.  Hypokalemia. -  Potassium will be replaced and magnesium level will be checked.  3.  Dyslipidemia. - We will continue statin therapy.  4.  Adrenal insufficiency. - We will continue steroid management.  5.  History of suspected seizures with history of craniotomy. - Continue Keppra.  6.  Essential hypertension. - We will continue Adalat CC.  DVT prophylaxis: Lovenox.  Code Status: full code.  Family Communication:  The plan of care was discussed in details with the patient (and family). I answered all questions. The patient agreed to proceed with the above mentioned plan. Further management will depend upon hospital course. Disposition Plan: Back to previous home environment Consults called: none.  All the records are reviewed and case discussed with ED provider.  Status is: Observation  The patient remains OBS appropriate and will d/c before 2 midnights.  Dispo: The patient is from: Home              Anticipated d/c is to: Home              Patient currently is not medically stable to d/c.   Difficult to place patient No      TOTAL TIME TAKING CARE OF THIS PATIENT: 55 minutes.    Mahari Vankirk A Jeremiyah Cullens M.D  on 01/22/2021 at 5:17 AM  [facility]   From 7 PM-7 AM, contact night-coverage [email or web address]  CC: Primary care physician; [place]

## 2021-01-22 NOTE — ED Notes (Signed)
Report messaged to [employer]

## 2021-01-22 NOTE — ED Notes (Signed)
Pressure dressing applied to buttocks. Stage 1 wounds (not open) noted on right side of buttocks.

## 2021-01-22 NOTE — ED Notes (Signed)
Resumed care from [place] rn.  Pt resting  Iv fluids infusing.  Pt waiting on admssion

## 2021-01-22 NOTE — ED Notes (Signed)
Finger stick blood sugar was 132

## 2021-01-22 NOTE — ED Notes (Signed)
Pt ate 80% of her lunch with help from this RN. No further needs expressed.

## 2021-01-22 NOTE — ED Notes (Signed)
Finger stick blood sugar was 119

## 2021-01-22 NOTE — Plan of Care (Signed)

## 2021-01-22 NOTE — Progress Notes (Signed)
Patient ID: Cheyenne Anthony, female   DOB: [birth year], 75 y.o.   MRN: [id number] This is no charge note as pt was admitted this am.  H/p reviewed , pt seen in ED.  Cheyenne Anthony is a 75 y.o. 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  The patient was found to have an initial blood glucose of 29.  She is normally alert and conversive at baseline.  On route to the hospital he was given D10W.  He was able to answer simple questions and follow simple commands but his blood glucose was still in the 30s upon arrival.  She was given an amp of D50 and started on D10 half-normal saline.  No nausea or vomiting or abdominal pain.  No chest pain or dyspnea or cough.  No dysuria, oliguria or hematuria or flank pain.  She has chronic respiratory failure on home O2 at 2 L/min.  A/P Continue IVF with D5 Supplement electrolytes Continue steroids Monitor labs

## 2021-01-22 NOTE — ED Notes (Signed)
fsbs 214

## 2021-01-22 NOTE — ED Provider Notes (Signed)
[facility] Emergency Department Provider Note   ____________________________________________   Event Date/Time   First MD Initiated Contact with Patient 01/22/21 0258     (approximate)  I have reviewed the triage vital signs and the nursing notes.   HISTORY  Chief Complaint Hypoglycemia  Level of V caveat: Limited by decreased LOC  HPI Cheyenne Anthony is a 75 y.o. female brought to the ED via EMS from home with a chief complaint of altered mentation.  Patient with a history of stage IV brain, kidney and lung cancer, CVA, primary adrenal insufficiency, diabetes, hypertension who was found by family with decreased LOC.  Reportedly family states patient is normally alert and conversive.  Initial blood sugar 29.  D10 administered en route with improvement in mentation.  Patient able to answer simple questions and follow simple commands.  Denies fever, cough, chest pain, shortness of breath, abdominal pain, nausea or vomiting.  Normally wears 2 L oxygen by nasal cannula.     Past Medical History:  Diagnosis Date   Adrenal insufficiency, primary ([place]) 10/05/2020   Brain mass 07/31/2020   Cancer ([place])    Hypertension    Right lower lobe lung mass    Type II diabetes mellitus with renal manifestations [facility])     Patient Active Problem List   Diagnosis Date Noted   Ankle pain 10/11/2020   Renal mass 10/11/2020   Pulmonary emboli ([place]) 10/11/2020   DVT (deep venous thrombosis) ([place]) 10/11/2020   Adrenal insufficiency, primary ([place]) 10/05/2020   Pressure ulcer of coccygeal region, stage 3 ([place]) 10/04/2020   CVA (cerebral vascular accident) ([place]) 10/03/2020   Glaucoma 08/21/2020   Obesity (BMI 30-39.9) 08/21/2020   Low serum cortisol level ([place]) 08/21/2020   S/P craniotomy 08/20/2020   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 No pertinent surgical history.  Prior to Admission medications   Medication Sig Start Date End Date Taking? Authorizing Provider  apixaban (ELIQUIS) 5 MG TABS tablet Take 5 mg by mouth every 12 (twelve) hours. 09/25/20   [provider]  atorvastatin (LIPITOR) 20 MG tablet Take 1 tablet (20 mg total) by mouth daily. 10/17/20   Dwyane Dee, MD  DULoxetine (CYMBALTA) 60 MG capsule Take 60 mg by mouth daily.    [provider]  hydrocortisone (CORTEF) 10 MG tablet Take 1 tablet (10 mg total) by mouth in the morning. 10/17/20   Dwyane Dee, MD  hydrocortisone (CORTEF) 5 MG tablet Take 1 tablet (5 mg total) by mouth daily in the afternoon. 10/16/20   Dwyane Dee, MD  insulin glargine (LANTUS) 100 UNIT/ML injection Inject 0.1 mLs (10 Units total) into the skin 2 (two) times daily. 10/16/20   Dwyane Dee, MD  latanoprost (XALATAN) 0.005 % ophthalmic solution Place 1 drop into both eyes at bedtime.    [provider]  levETIRAcetam (KEPPRA) 500 MG tablet Take 1 tablet (500 mg total) by mouth 2 (two) times daily.  10/16/20   Dwyane Dee, MD  senna-docusate (SENOKOT-S) 8.6-50 MG tablet Take 2 tablets by mouth in the morning and at bedtime. 08/09/20   [provider]  sitaGLIPtin-metformin (JANUMET) 50-500 MG tablet Take 1 tablet by mouth in the morning and at bedtime.    [provider]    Allergies Patient has no known allergies.  Family History  Problem Relation Age of Onset   Stroke Brother     Social History Social History   Tobacco Use   Smoking status: Former     Pack years: 0.00    Types: Cigarettes    Quit date: 07/31/2020    Years since quitting: 0.4   Smokeless tobacco: Never  Substance Use Topics   Alcohol use: Not Currently    Comment: occ   Drug use: Never    Review of Systems  Constitutional: No fever/chills Eyes: No visual changes. ENT: No sore throat. Cardiovascular: Denies chest pain. Respiratory: Denies shortness of breath. Gastrointestinal: No abdominal pain.  No nausea, no vomiting.  No diarrhea.  No constipation. Genitourinary: Negative for dysuria. Musculoskeletal: Negative for back pain. Skin: Negative for rash. Neurological: Positive for AMS.  Negative for headaches, focal weakness or numbness.   ____________________________________________   PHYSICAL EXAM:  VITAL SIGNS: ED Triage Vitals [01/22/21 0255]  Enc Vitals Group     BP      Pulse      Resp      Temp 97.7 F (36.5 C)     Temp Source Oral     SpO2      Weight      Height      Head Circumference      Peak Flow      Pain Score      Pain Loc      Pain Edu?      Excl. in [place]?     Constitutional: Alert and oriented.  Elderly and chronically ill appearing and in mild acute distress. Eyes: Conjunctivae are normal. PERRL. EOMI. Head: Atraumatic. Nose: No congestion/rhinnorhea. Mouth/Throat: Mucous membranes are mildly dry. Neck: No stridor.   Cardiovascular: Normal rate, regular rhythm. Grossly normal heart sounds.  Good peripheral circulation. Respiratory: Normal respiratory effort.  No retractions. Lungs CTAB. Gastrointestinal: Soft and nontender to light or deep palpation. No distention. No abdominal bruits. No CVA tenderness. Musculoskeletal: No lower extremity tenderness nor edema.  No joint effusions. Neurologic:  Normal speech and language.  Baseline facial droop.  LUE and BLE contractures.  Equal grip strengths.   Skin:  Skin is warm, dry and intact. No rash noted.  No petechiae. Psychiatric: Mood and affect are normal. Speech and  behavior are normal.  ____________________________________________   LABS (all labs ordered are listed, but only abnormal results are displayed)  Labs Reviewed  CBC WITH DIFFERENTIAL/PLATELET - Abnormal; Notable for the following components:      Result Value   RBC 3.55 (*)    Hemoglobin 8.3 (*)    HCT 28.6 (*)    MCH 23.4 (*)    MCHC 29.0 (*)    RDW 18.5 (*)    Platelets 556 (*)    All other components within normal limits  COMPREHENSIVE METABOLIC PANEL - Abnormal; Notable for the following components:   Potassium 3.0 (*)    Glucose, Bld 122 (*)    Calcium 8.7 (*)    Total Protein 6.4 (*)    Albumin 2.3 (*)    All other components within normal limits  CBG MONITORING,  ED - Abnormal; Notable for the following components:   Glucose-Capillary 38 (*)    All other components within normal limits  CBG MONITORING, ED - Abnormal; Notable for the following components:   Glucose-Capillary 35 (*)    All other components within normal limits  CBG MONITORING, ED - Abnormal; Notable for the following components:   Glucose-Capillary 132 (*)    All other components within normal limits  TROPONIN I (HIGH SENSITIVITY) - Abnormal; Notable for the following components:   Troponin I (High Sensitivity) 25 (*)    All other components within normal limits  CULTURE, BLOOD (ROUTINE X 2)  CULTURE, BLOOD (ROUTINE X 2)  URINE CULTURE  RESP PANEL BY RT-PCR (FLU A&B, COVID) ARPGX2  LACTIC ACID, PLASMA  URINALYSIS, COMPLETE (UACMP) WITH MICROSCOPIC  PROCALCITONIN   ____________________________________________  EKG  ED ECG REPORT I, Blayne Frankie J, the attending physician, personally viewed and interpreted this ECG.   Date: 01/22/2021  EKG Time: 0302  Rate: 82  Rhythm: normal sinus rhythm  Axis: LAD  Intervals:nonspecific intraventricular conduction delay  ST&T Change: Nonspecific  ____________________________________________  RADIOLOGY I, Karrin Eisenmenger J, personally viewed and evaluated  these images (plain radiographs) as part of my medical decision making, as well as reviewing the written report by the radiologist.  ED MD interpretation: Enlarging right lung mass, left base atelectasis versus infiltrate  Official radiology report(s): DG Chest Port 1 View  Result Date: 01/22/2021 CLINICAL DATA:  Altered mental status EXAM: PORTABLE CHEST 1 VIEW COMPARISON:  08/19/2020 FINDINGS: Masslike opacity in the right lung base has increased significantly in size since prior study. Increasing airspace opacity at the left base could reflect atelectasis or infiltrate. Heart mildly enlarged. No edema or effusions. IMPRESSION: Enlarging mass at the right lung base. Left base atelectasis or infiltrate. Cardiomegaly. Electronically Signed   By: Rolm Baptise M.D.   On: 01/22/2021 03:14    ____________________________________________   PROCEDURES  Procedure(s) performed (including Critical Care):  .1-3 Lead EKG Interpretation  Date/Time: 01/22/2021 3:29 AM Performed by: Paulette Blanch, MD Authorized by: Paulette Blanch, MD     Interpretation: normal     ECG rate:  81   ECG rate assessment: normal     Rhythm: sinus rhythm     Ectopy: none     Conduction: normal   Comments:     Patient placed on cardiac monitor to evaluate for arrhythmias   CRITICAL CARE Performed by: Paulette Blanch   Total critical care time: 45 minutes  Critical care time was exclusive of separately billable procedures and treating other patients.  Critical care was necessary to treat or prevent imminent or life-threatening deterioration.  Critical care was time spent personally by me on the following activities: development of treatment plan with patient and/or surrogate as well as nursing, discussions with consultants, evaluation of patient's response to treatment, examination of patient, obtaining history from patient or surrogate, ordering and performing treatments and interventions, ordering and review of  laboratory studies, ordering and review of radiographic studies, pulse oximetry and re-evaluation of patient's condition.  ____________________________________________   INITIAL IMPRESSION / ASSESSMENT AND PLAN / ED COURSE  As part of my medical decision making, I reviewed the following data within the [place] notes reviewed and incorporated, Labs reviewed, EKG interpreted, Old chart reviewed, Radiograph reviewed, Discussed with admitting physician, and Notes from prior ED visits     75 year old female with stage IV cancer presenting with altered mentation. Differential diagnosis includes, but is not  limited to, alcohol, illicit or prescription medications, or other toxic ingestion; intracranial pathology such as stroke or intracerebral hemorrhage; fever or infectious causes including sepsis; hypoxemia and/or hypercarbia; uremia; trauma; endocrine related disorders such as diabetes, hypoglycemia, and thyroid-related diseases; hypertensive encephalopathy; etc.   Will obtain sepsis work-up, chest x-ray, CT head.  Repeat blood sugar on arrival is 35.  Will administer D50 in addition to continuing D10 drip.   Clinical Course as of 01/22/21 0532  Mon Jan 22, 2021  3734 Chest x-ray noted.  Patient does not meet sepsis criteria by vital signs.  In addition to lactic acid, will check procalcitonin. [JS]  2876 Noted history of adrenal insufficiency, will administer IV Solu-Cortef. [JS]  8115 Noted stable anemia, hypokalemia.  Will administer 2 runs of IV potassium. [JS]  [id number] CT head discussed with radiologist demonstrating subacute infarct compared with MRI dated March 2022. [JS]    Clinical Course User Index [JS] Paulette Blanch, MD     ____________________________________________   FINAL CLINICAL IMPRESSION(S) / ED DIAGNOSES  Final diagnoses:  Hypoglycemia  Altered mental status, unspecified altered mental status type  Hypokalemia     ED Discharge Orders      None        Note:  This document was prepared using Dragon voice recognition software and may include unintentional dictation errors.    Paulette Blanch, MD 01/22/21 [phone]

## 2021-01-23 ENCOUNTER — Inpatient Hospital Stay: Payer: Medicare (Managed Care)

## 2021-01-23 DIAGNOSIS — E162 Hypoglycemia, unspecified: Secondary | ICD-10-CM | POA: Diagnosis not present

## 2021-01-23 DIAGNOSIS — E870 Hyperosmolality and hypernatremia: Secondary | ICD-10-CM | POA: Diagnosis present

## 2021-01-23 DIAGNOSIS — Z823 Family history of stroke: Secondary | ICD-10-CM | POA: Diagnosis not present

## 2021-01-23 DIAGNOSIS — Z87891 Personal history of nicotine dependence: Secondary | ICD-10-CM | POA: Diagnosis not present

## 2021-01-23 DIAGNOSIS — R4182 Altered mental status, unspecified: Secondary | ICD-10-CM | POA: Diagnosis not present

## 2021-01-23 DIAGNOSIS — R29810 Facial weakness: Secondary | ICD-10-CM | POA: Diagnosis present

## 2021-01-23 DIAGNOSIS — I6389 Other cerebral infarction: Secondary | ICD-10-CM | POA: Diagnosis present

## 2021-01-23 DIAGNOSIS — R414 Neurologic neglect syndrome: Secondary | ICD-10-CM | POA: Diagnosis present

## 2021-01-23 DIAGNOSIS — Z86711 Personal history of pulmonary embolism: Secondary | ICD-10-CM | POA: Diagnosis not present

## 2021-01-23 DIAGNOSIS — C7931 Secondary malignant neoplasm of brain: Secondary | ICD-10-CM | POA: Diagnosis present

## 2021-01-23 DIAGNOSIS — J9610 Chronic respiratory failure, unspecified whether with hypoxia or hypercapnia: Secondary | ICD-10-CM | POA: Diagnosis present

## 2021-01-23 DIAGNOSIS — E876 Hypokalemia: Secondary | ICD-10-CM | POA: Diagnosis not present

## 2021-01-23 DIAGNOSIS — E869 Volume depletion, unspecified: Secondary | ICD-10-CM | POA: Diagnosis present

## 2021-01-23 DIAGNOSIS — Z794 Long term (current) use of insulin: Secondary | ICD-10-CM | POA: Diagnosis not present

## 2021-01-23 DIAGNOSIS — Z79899 Other long term (current) drug therapy: Secondary | ICD-10-CM | POA: Diagnosis not present

## 2021-01-23 DIAGNOSIS — E11649 Type 2 diabetes mellitus with hypoglycemia without coma: Secondary | ICD-10-CM | POA: Diagnosis present

## 2021-01-23 DIAGNOSIS — R401 Stupor: Secondary | ICD-10-CM | POA: Diagnosis not present

## 2021-01-23 DIAGNOSIS — Z7984 Long term (current) use of oral hypoglycemic drugs: Secondary | ICD-10-CM | POA: Diagnosis not present

## 2021-01-23 DIAGNOSIS — G9341 Metabolic encephalopathy: Secondary | ICD-10-CM | POA: Diagnosis present

## 2021-01-23 DIAGNOSIS — C3491 Malignant neoplasm of unspecified part of right bronchus or lung: Secondary | ICD-10-CM | POA: Diagnosis present

## 2021-01-23 DIAGNOSIS — Z9981 Dependence on supplemental oxygen: Secondary | ICD-10-CM | POA: Diagnosis not present

## 2021-01-23 DIAGNOSIS — J9811 Atelectasis: Secondary | ICD-10-CM | POA: Diagnosis present

## 2021-01-23 DIAGNOSIS — D630 Anemia in neoplastic disease: Secondary | ICD-10-CM | POA: Diagnosis present

## 2021-01-23 DIAGNOSIS — I119 Hypertensive heart disease without heart failure: Secondary | ICD-10-CM | POA: Diagnosis present

## 2021-01-23 DIAGNOSIS — Z7901 Long term (current) use of anticoagulants: Secondary | ICD-10-CM | POA: Diagnosis not present

## 2021-01-23 DIAGNOSIS — E785 Hyperlipidemia, unspecified: Secondary | ICD-10-CM | POA: Diagnosis present

## 2021-01-23 DIAGNOSIS — Z20822 Contact with and (suspected) exposure to covid-19: Secondary | ICD-10-CM | POA: Diagnosis present

## 2021-01-23 LAB — HEMOGLOBIN A1C
Hgb A1c MFr Bld: 5.6 % (ref 4.8–5.6)
Mean Plasma Glucose: 114 mg/dL

## 2021-01-23 LAB — GLUCOSE, CAPILLARY
Glucose-Capillary: 38 mg/dL — CL (ref 70–99)
Glucose-Capillary: 143 mg/dL — ABNORMAL HIGH (ref 70–99)
Glucose-Capillary: 146 mg/dL — ABNORMAL HIGH (ref 70–99)
Glucose-Capillary: 151 mg/dL — ABNORMAL HIGH (ref 70–99)
Glucose-Capillary: 109 mg/dL — ABNORMAL HIGH (ref 70–99)
Glucose-Capillary: 155 mg/dL — ABNORMAL HIGH (ref 70–99)

## 2021-01-23 LAB — BASIC METABOLIC PANEL
Anion gap: 8 (ref 5–15)
BUN: 8 mg/dL (ref 8–23)
CO2: 26 mmol/L (ref 22–32)
Calcium: 9.4 mg/dL (ref 8.9–10.3)
Chloride: 116 mmol/L — ABNORMAL HIGH (ref 98–111)
Creatinine, Ser: 0.51 mg/dL (ref 0.44–1.00)
GFR, Estimated: >60 mL/min (ref >60)
Glucose, Bld: 153 mg/dL — ABNORMAL HIGH (ref 70–99)
Potassium: 2.9 mmol/L — ABNORMAL LOW (ref 3.5–5.1)
Sodium: 150 mmol/L — ABNORMAL HIGH (ref 135–145)

## 2021-01-23 LAB — PROCALCITONIN: Procalcitonin: 93.68 ng/mL

## 2021-01-23 LAB — CBC
HCT: 31.5 % — ABNORMAL LOW (ref 36.0–46.0)
Hemoglobin: 9.5 g/dL — ABNORMAL LOW (ref 12.0–15.0)
MCH: 23.8 pg — ABNORMAL LOW (ref 26.0–34.0)
MCHC: 30.2 g/dL (ref 30.0–36.0)
MCV: 78.9 fL — ABNORMAL LOW (ref 80.0–100.0)
Platelets: 586 10*3/uL — ABNORMAL HIGH (ref 150–400)
RBC: 3.99 MIL/uL (ref 3.87–5.11)
RDW: 18.6 % — ABNORMAL HIGH (ref 11.5–15.5)
WBC: 8.7 10*3/uL (ref 4.0–10.5)
nRBC: 0 % (ref 0.0–0.2)

## 2021-01-23 MED ORDER — HYDROCODONE-ACETAMINOPHEN 5-325 MG PO TABS
1.0000 | ORAL_TABLET | Freq: Two times a day (BID) | ORAL | Status: DC
  Administered 2021-01-23 – 2021-01-24 (×3): 1 via ORAL
  Filled 2021-01-23 (×3): qty 1

## 2021-01-23 MED ORDER — POTASSIUM CHLORIDE CRYS ER 20 MEQ PO TBCR
40.0000 meq | EXTENDED_RELEASE_TABLET | Freq: Once | ORAL | Status: AC
  Administered 2021-01-23: 40 meq via ORAL
  Filled 2021-01-23: qty 2

## 2021-01-23 MED ORDER — LACTATED RINGERS IV SOLN: INTRAVENOUS | Status: DC

## 2021-01-23 MED ORDER — GADOBUTROL 1 MMOL/ML IV SOLN
8.0000 mL | Freq: Once | INTRAVENOUS | Status: AC | PRN
  Administered 2021-01-23: 8 mL via INTRAVENOUS

## 2021-01-23 MED ORDER — POTASSIUM CHLORIDE 10 MEQ/100ML IV SOLN
10.0000 meq | INTRAVENOUS | Status: AC
  Administered 2021-01-23 (×4): 10 meq via INTRAVENOUS
  Filled 2021-01-23 (×4): qty 100

## 2021-01-23 NOTE — Progress Notes (Addendum)
Talk PROGRESS NOTE    Cheyenne Anthony  WNI:[id number] DOB: 1946/07/03 DOA: 01/22/2021 PCP: [place]    Brief Narrative:  Cheyenne Anthony is a 75 y.o. 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  The patient was found to have an initial blood glucose of 29. Procalcitonin elevated. Cxr with mass. Started on iv abx, cx pending. She is a [facility] pt.   Consultants:    Procedures:   Antimicrobials:   Vanco and cefepime   Subjective: Patient is more awake to her daughter.  Smiling for me when I talk to ask her questions.  She is trying to eat today.  Per daughter she is close to baseline.  Objective: Vitals:   01/23/21 0049 01/23/21 0341 01/23/21 0828 01/23/21 1154  BP: (!) 157/78 140/73 122/82 (!) 148/82  Pulse: 93 95 95 92  Resp: 18 16 18 17   Temp: 98.3 F (36.8 C) (!) 97.5 F (36.4 C) 99 F (37.2 C) 97.9 F (36.6 C)  TempSrc: Oral Oral    SpO2:  100% 94% 94%  Weight:        Intake/Output Summary (Last 24 hours) at 01/23/2021 1344 Last data filed at 01/23/2021 0400 Gross per 24 hour  Intake 240 ml  Output 4950 ml  Net -4710 ml   Filed Weights   01/22/21 0929 01/22/21 2300  Weight: 91.4 kg 89.7 kg    Examination:  General exam: Appears calm and comfortable  Respiratory system: Decreased breath sounds, no wheezing Cardiovascular system: S1 & S2 heard, RRR. No JVD gastrointestinal system: Abdomen is nondistended, soft and nontender.  Normal bowel sounds heard. Central nervous system: Alert awake, close to baseline per daughter. Chronic left droop.  Extremities: No edema Skin: Warm dry Psychiatry: Mood & affect appropriate.     Data Reviewed: I have personally reviewed following labs  and imaging studies  CBC: Recent Labs  Lab 01/22/21 0305 01/23/21 0446  WBC 6.2 8.7  NEUTROABS 4.3  --   HGB 8.3* 9.5*  HCT 28.6* 31.5*  MCV 80.6 78.9*  PLT 556* 381*   Basic Metabolic Panel: Recent Labs  Lab 01/22/21 0305 01/23/21 0446  NA 139 150*  K 3.0* 2.9*  CL 105 116*  CO2 28 26  GLUCOSE 122* 153*  BUN 10 8  CREATININE 0.55 0.51  CALCIUM 8.7* 9.4   GFR: Estimated Creatinine Clearance: 66.9 mL/min (by C-G formula based on SCr of 0.51 mg/dL). Liver Function Tests: Recent Labs  Lab 01/22/21 0305  AST 18  ALT 7  ALKPHOS 73  BILITOT 0.5  PROT 6.4*  ALBUMIN 2.3*   No results for input(s): LIPASE, AMYLASE in the last 168 hours. No results for input(s): AMMONIA in the last 168 hours. Coagulation Profile: No results for input(s): INR, PROTIME in the last 168 hours. Cardiac Enzymes: No results for input(s): CKTOTAL, CKMB, CKMBINDEX, TROPONINI in the last 168 hours. BNP (last 3 results) No results for input(s): PROBNP in the last 8760 hours. HbA1C: Recent Labs    01/22/21 0305  HGBA1C 5.6   CBG: Recent Labs  Lab 01/22/21 1651 01/22/21 2229 01/23/21 0637 01/23/21 0825 01/23/21 1157  GLUCAP 214* 143* 143* 146* 151*   Lipid Profile: No results for input(s): CHOL, HDL, LDLCALC, TRIG, CHOLHDL, LDLDIRECT in the last 72 hours. Thyroid  Function Tests: No results for input(s): TSH, T4TOTAL, FREET4, T3FREE, THYROIDAB in the last 72 hours. Anemia Panel: No results for input(s): VITAMINB12, FOLATE, FERRITIN, TIBC, IRON, RETICCTPCT in the last 72 hours. Sepsis Labs: Recent Labs  Lab 01/22/21 0305  PROCALCITON 65.06  LATICACIDVEN 1.5    Recent Results (from the past 240 hour(s))  Culture, blood (routine x 2)     Status: None (Preliminary result)   Collection Time: 01/22/21  3:05 AM   Specimen: BLOOD  Result Value Ref Range Status   Specimen Description BLOOD BLOOD RIGHT ARM  Final   Special Requests   Final    BOTTLES DRAWN AEROBIC AND ANAEROBIC  Blood Culture adequate volume   Culture   Final    NO GROWTH 1 DAY Performed at [facility], [address], [place], [place] [address]    Report Status PENDING  Incomplete  Culture, blood (routine x 2)     Status: None (Preliminary result)   Collection Time: 01/22/21  3:05 AM   Specimen: BLOOD  Result Value Ref Range Status   Specimen Description BLOOD BLOOD RIGHT ARM  Final   Special Requests   Final    BOTTLES DRAWN AEROBIC AND ANAEROBIC Blood Culture results may not be optimal due to an inadequate volume of blood received in culture bottles   Culture   Final    NO GROWTH 1 DAY Performed at [facility], [address] [place],  [address]    Report Status PENDING  Incomplete  Resp Panel by RT-PCR (Flu A&B, Covid) Nasopharyngeal Swab     Status: None   Collection Time: 01/22/21  3:05 AM   Specimen: Nasopharyngeal Swab; Nasopharyngeal(NP) swabs in vial transport medium  Result Value Ref Range Status   SARS Coronavirus 2 by RT PCR NEGATIVE NEGATIVE Final    Comment: (NOTE) SARS-CoV-2 target nucleic acids are NOT DETECTED.  The SARS-CoV-2 RNA is generally detectable in upper respiratory specimens during the acute phase of infection. The lowest concentration of SARS-CoV-2 viral copies this assay can detect is 138 copies/mL. A negative result does not preclude SARS-Cov-2 infection and should not be used as the sole basis for treatment or other patient management decisions. A negative result may occur with  improper specimen collection/handling, submission of specimen other than nasopharyngeal swab, presence of viral mutation(s) within the areas targeted by this assay, and inadequate number of viral copies(<138 copies/mL). A negative result must be combined with clinical observations, patient history, and epidemiological information. The expected result is Negative.  Fact Sheet for Patients:  EntrepreneurPulse.com.au  Fact Sheet for  Healthcare Providers:  IncredibleEmployment.be  This test is no t yet approved or cleared by the [place] FDA and  has been authorized for detection and/or diagnosis of SARS-CoV-2 by FDA under an Emergency Use Authorization (EUA). This EUA will remain  in effect (meaning this test can be used) for the duration of the COVID-19 declaration under Section 564(b)(1) of the Act, 21 U.S.C.section 360bbb-3(b)(1), unless the authorization is terminated  or revoked sooner.       Influenza A by PCR NEGATIVE NEGATIVE Final   Influenza B by PCR NEGATIVE NEGATIVE Final    Comment: (NOTE) The Xpert Xpress SARS-CoV-2/FLU/RSV plus assay is intended as an aid in the diagnosis of influenza from Nasopharyngeal swab specimens and should not be used as a sole basis for treatment. Nasal washings and aspirates are unacceptable for Xpert Xpress SARS-CoV-2/FLU/RSV testing.  Fact Sheet for Patients: EntrepreneurPulse.com.au  Fact Sheet for  Healthcare Providers: IncredibleEmployment.be  This test is not yet approved or cleared by the [place] and has been authorized for detection and/or diagnosis of SARS-CoV-2 by FDA under an Emergency Use Authorization (EUA). This EUA will remain in effect (meaning this test can be used) for the duration of the COVID-19 declaration under Section 564(b)(1) of the Act, 21 U.S.C. section 360bbb-3(b)(1), unless the authorization is terminated or revoked.  Performed at [facility], [address] [place], [place] [address]   Urine culture     Status: None (Preliminary result)   Collection Time: 01/22/21  7:38 AM   Specimen: Urine, Random  Result Value Ref Range Status   Specimen Description   Final    URINE, RANDOM Performed at [facility], [address], [place], [place] [address]    Special Requests   Final    NONE Performed at [facility], [address]  [place], [place] [address]    Culture   Final    CULTURE REINCUBATED FOR BETTER GROWTH Performed at [facility], [place] [address], [place],  [address]    Report Status PENDING  Incomplete  MRSA Next Gen by PCR, Nasal     Status: None   Collection Time: 01/22/21  1:27 PM   Specimen: Nasal Mucosa; Nasal Swab  Result Value Ref Range Status   MRSA by PCR Next Gen NOT DETECTED NOT DETECTED Final    Comment: (NOTE) The GeneXpert MRSA Assay (FDA approved for NASAL specimens only), is one component of a comprehensive MRSA colonization surveillance program. It is not intended to diagnose MRSA infection nor to guide or monitor treatment for MRSA infections. Test performance is not FDA approved in patients less than 54 years old. Performed at [facility], [address] [place],  [address]          Radiology Studies: CT Head Wo Contrast  Result Date: 01/22/2021 CLINICAL DATA:  Altered mental status EXAM: CT HEAD WITHOUT CONTRAST TECHNIQUE: Contiguous axial images were obtained from the base of the skull through the vertex without intravenous contrast. 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 No significant associated mass effect or superimposed hemorrhage identified. Encephalomalacia related to remote right frontal MCA territory cortical infarct again noted. There has been interval evolution of the a right occipital lobe cortical infarct with progressive encephalomalacia in this region. Mild parenchymal volume loss is commensurate with the patient's age. Moderate periventricular white matter changes are again identified likely reflecting the sequela of small vessel ischemia. No abnormal intra or extra-axial mass lesion. No acute intracranial hemorrhage no abnormal mass effect or midline shift. Ventricular size is normal. Remote right suboccipital craniotomy has been performed with associated  cephalo malacia within the right cerebellar hemisphere again noted. Vascular: No asymmetric hyperdense vasculature at the skull base. Skull: No acute fracture. Sinuses/Orbits: The visualized paranasal sinuses are clear. The orbits are unremarkable. Other: The mastoid air cells and middle ear cavities are clear. IMPRESSION: Interval development of a subacute cortical infarct involving the left precentral gyrus with no superimposed hemorrhage or significant associated mass effect. Interval evolution of remote right occipital cortical infarct. Stable encephalomalacia within the right cerebellar hemisphere and right frontal lobe with surgical changes of prior right suboccipital craniotomy again identified. Stable senescent changes. Electronically Signed   By: Fidela Salisbury MD   On: 01/22/2021 04:23   DG Chest Port 1 View  Result Date: 01/22/2021 CLINICAL DATA:  Altered mental status EXAM: PORTABLE CHEST 1 VIEW COMPARISON:  08/19/2020 FINDINGS: Masslike opacity in the right lung base has increased significantly in size since prior study. Increasing airspace opacity at the left base could reflect atelectasis or infiltrate. Heart mildly enlarged. No edema or effusions. IMPRESSION: Enlarging mass at the right lung base. Left base atelectasis or infiltrate. Cardiomegaly. Electronically Signed   By: Rolm Baptise M.D.   On: 01/22/2021 03:14        Scheduled Meds:  apixaban  5 mg Oral Q12H   atorvastatin  20 mg Oral Daily   DULoxetine  60 mg Oral Daily   HYDROcodone-acetaminophen  1 tablet Oral BID   hydrocortisone  10 mg Oral q AM   hydrocortisone  5 mg Oral Q1500   insulin aspart  0-9 Units Subcutaneous TID AC & HS   latanoprost  1 drop Both Eyes QHS   levETIRAcetam  500 mg Oral BID   Continuous Infusions:  ceFEPime (MAXIPIME) IV 2 g (01/23/21 0631)   lactated ringers 50 mL/hr at 01/23/21 0949   potassium chloride 10 mEq (01/23/21 1239)   vancomycin      Assessment & Plan:   Active Problems:    Hypoglycemia   1.  Acute metabolic cephalopathy likely secondary to hypoglycemia with type 2 diabetes mellitus. Per glycemia improved. She is starting to eat. Continue holding Lantus and Janumet Change IV fluids to LR as she becoming hypernatremia R-ISS Continue to monitor her p.o. intake and fingersticks CT head completed, with Interval development of a subacute cortical infarct involving the left precentral gyrus with no superimposed hemorrhage or significant associated mass effect. Will consult neurology  2.  Hypokalemia. K is 2.9 today Will replace Monitor labs  3.  Elevated troponin-minimally elevated No chest pain Likely demand ischemia  4.hypernatremia-sodium 150 Change IV fluids to LR  Monitor levels   5.dyslipidemia. statin  4.  Adrenal insufficiency. Continue steroid  5.  History of suspected seizures with history of craniotomy. Continue Keppra  6.  Essential hypertension. Continue Adalat  7.Elevated procalciton Afebrile.  LA nml. Bcx pending If cx negative, and procal trending down, will dc iv abx   8.hx/o cancer/PE Brain mets -followed by oncology On a/c     DVT prophylaxis: Eliquis Code Status: Full Family Communication: daughter at bedside Disposition Plan:  Status is: Observation  The patient already had 2 [place] stays, and needs to be upgraded to inpatient status.   Dispo: The patient is from: Home              Anticipated d/c is to: Home              Patient currently is not medically stable to d/c.   Difficult to place patient No            LOS: 0 days   Time spent: 45 min with >50% on coc    Nolberto Hanlon, MD [facility] Pager [phone]  If 7PM-7AM, please contact night-coverage 01/23/2021, 1:44 PM

## 2021-01-23 NOTE — Consult Note (Addendum)
NEURO HOSPITALIST CONSULT NOTE   Requestig physician: Dr. Kurtis Bushman  Reason for Consult: AMS and new stroke seen on CT  History obtained from:  Daughter and Chart     HPI:                                                                                                                                          Cheyenne Anthony is an 75 y.o. female with a history of stage 4 metastatic lung cancer diagnosed in January of this year with mets to brain and kidney, s/p resections of her brain mets who since then has had persistent neurological deficits including waxing/waning levels of confusion and dysphasia. Her PMHx also includes recent stroke, primary adrenal insufficiency, HTN and DM2. She presented to the hostpital yesterday with AMS. Her CBG on EMS arrival to her home was 29 and she was administered D10 en route with improvement of her mentation. On initial assessment in the ED, the patient was "able to answer all questions asked by MD at bedside with ease." She denied fever, cough, CP, SOB, nausea or vomiting.   CT head obtained in the ED revealed a subacute infarct which was new as compared with MRI dated March 2022.   She was admitted for further medical management under the Hospitalist team. Dr. Randel Books admission HPI was reviewed: "Cheyenne Anthony is a 75 y.o. 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  The patient was found to have an initial blood glucose of 29.  She is normally alert and conversive at baseline.  On route to the hospital he was given D10W.  He was able to answer simple questions and follow simple commands but his blood glucose was still in the 30s upon arrival.  She was given an amp of D50 and started  on D10 half-normal saline.  No nausea or vomiting or abdominal pain.  No chest pain or dyspnea or cough.  No dysuria, oliguria or hematuria or flank pain.  She has chronic respiratory failure on home O2 at 2 L/min. ED Course: When he came to the ER vital signs were within normal, except for pulse oximetry that was 96% on 2 L of O2 by nasal cannula.  Labs revealed hypokalemia of 3 and blood glucose of 122 postoperative 6.4 albumin 2.3.  High-sensitivity troponin was 25 and lactic acid 1.5 with a procalcitonin of 65.  CBC showed anemia lower than previous levels with decreased RBC indices and thrombocytosis with platelets of 556 higher than previous levels.  COVID-19 PCR and influenza antigens came back negative.  Blood cultures  were drawn.  EKG as reviewed by me : N showed no sinus rhythm with rate of 82 with poor R wave progression and nonspecific intraventricular conduction delay with left axis deviation. Imaging: Portable chest x-ray showed enlarging mass at the right lung base with left basal atelectasis or infiltrate and cardiomegaly.The patient was given 100 mg IV Solu-Cortef, 10 mEq IV potassium chloride twice, IV D50 as mentioned above followed by D10 half-normal saline 100 mill per hour.  She will be admitted to a medical monitored bed for further evaluation and management."  Neurology was consulted regarding the subacute stroke seen on CT.   Procalcitonin elevated. Started on iv abx, with cx pending.     Past Medical History:  Diagnosis Date   Adrenal insufficiency, primary ([place]) 10/05/2020   Brain mass 07/31/2020   Cancer ([place])    Hypertension    Right lower lobe lung mass    Type II diabetes mellitus with renal manifestations ([place])     History reviewed. No pertinent surgical history.  Family History  Problem Relation Age of Onset   Stroke Brother              Social History:  reports that she quit smoking about 5 months ago. Her smoking use included cigarettes. She has never used  smokeless tobacco. She reports previous alcohol use. She reports that she does not use drugs.  No Known Allergies  MEDICATIONS:                                                                                                                     Prior to Admission:  Medications Prior to Admission  Medication Sig Dispense Refill Last Dose   apixaban (ELIQUIS) 5 MG TABS tablet Take 5 mg by mouth every 12 (twelve) hours.   Past Week   atorvastatin (LIPITOR) 20 MG tablet Take 1 tablet (20 mg total) by mouth daily.   Past Week   DULoxetine (CYMBALTA) 60 MG capsule Take 60 mg by mouth daily.   Past Week   HYDROcodone-acetaminophen (NORCO/VICODIN) 5-325 MG tablet Take 1 tablet by mouth 2 (two) times daily.   01/21/2021   hydrocortisone (CORTEF) 10 MG tablet Take 1 tablet (10 mg total) by mouth in the morning.   Past Week   hydrocortisone (CORTEF) 5 MG tablet Take 1 tablet (5 mg total) by mouth daily in the afternoon.   Past Week   insulin glargine (LANTUS) 100 UNIT/ML injection Inject 0.1 mLs (10 Units total) into the skin 2 (two) times daily. 10 mL 11 Past Week   latanoprost (XALATAN) 0.005 % ophthalmic solution Place 1 drop into both eyes at bedtime.   Past Week   levETIRAcetam (KEPPRA) 500 MG tablet Take 1 tablet (500 mg total) by mouth 2 (two) times daily.   Past Week   senna-docusate (SENOKOT-S) 8.6-50 MG tablet Take 2 tablets by mouth in the morning and at bedtime.   Past Week   sitaGLIPtin-metformin (JANUMET) 50-500 MG tablet Take 1 tablet by mouth  in the morning and at bedtime.   Past Week   LORAZEPAM INTENSOL 2 MG/ML concentrated solution Take 0.25 mLs by mouth every 2 (two) hours as needed for anxiety or agitation.   prn at prn   Morphine Sulfate (MORPHINE CONCENTRATE) 10 mg / 0.5 ml concentrated solution Place 0.2 mLs under the tongue every 2 (two) hours.   prn at prn   ondansetron (ZOFRAN-ODT) 4 MG disintegrating tablet Take 4 mg by mouth 3 (three) times daily as needed for nausea/vomiting.    prn at prn   Scheduled:  apixaban  5 mg Oral Q12H   atorvastatin  20 mg Oral Daily   DULoxetine  60 mg Oral Daily   HYDROcodone-acetaminophen  1 tablet Oral BID   hydrocortisone  10 mg Oral q AM   hydrocortisone  5 mg Oral Q1500   insulin aspart  0-9 Units Subcutaneous TID AC & HS   latanoprost  1 drop Both Eyes QHS   levETIRAcetam  500 mg Oral BID   Continuous:  ceFEPime (MAXIPIME) IV 2 g (01/23/21 1522)   lactated ringers 50 mL/hr at 01/23/21 0949   vancomycin 1,000 mg (01/23/21 1627)     ROS:                                                                                                                                       As per HPI.   Blood pressure (!) 148/82, pulse 92, temperature 97.9 F (36.6 C), resp. rate 17, weight 89.7 kg, SpO2 94 %.   General Examination:                                                                                                       Physical Exam  HEENT-  Normocephalic   Lungs- Respirations unlabored Extremities- Warm and well perfused   Neurological Examination Mental Status: Awake. Does not answer questions except after painful stimulus. Minimal verbal output. Does not appear drowsy. Eyes deviated to the left and does not fixate on examiner, but will blink to threat on the left. Follows only simple motor commands. Does not answer orientation questions.   Cranial Nerves: II: PERRL. Blinks to threat on the left but not on the right.  III,IV, VI: Eyes deviated conjugately to the left. Does not track to the right. No nystagmus.  V: Reacts to eyelid and brow tactile stimulation bilaterally.  VII: RIGHT facial droop, lower quadrant.  VIII: hearing intact to some questions IX,X: Unable to assess XI:  Head rotated slightly to the left preferentially XII: Not following commands Motor: Lifts BUE antigravity to pain without gross asymmetry. Squeezes examiner's hands bilatearally with 4/5 strength to command. Otherwise not following upper  extremity motor commands.  BLE: Does not move to exam but calls out in pain when examiner attempts to move passively.  Sensory: States yes when asked if she can feel right foot plantar stimulation, but no response with left foot stimulation.  Deep Tendon Reflexes: Hypoactive throughout Plantars: Mute bilaterally  Cerebellar: Not following commands for assessment Gait: Unable to assess   Lab Results: Basic Metabolic Panel: Recent Labs  Lab 01/22/21 0305 01/23/21 0446  NA 139 150*  K 3.0* 2.9*  CL 105 116*  CO2 28 26  GLUCOSE 122* 153*  BUN 10 8  CREATININE 0.55 0.51  CALCIUM 8.7* 9.4    CBC: Recent Labs  Lab 01/22/21 0305 01/23/21 0446  WBC 6.2 8.7  NEUTROABS 4.3  --   HGB 8.3* 9.5*  HCT 28.6* 31.5*  MCV 80.6 78.9*  PLT 556* 586*    Cardiac Enzymes: No results for input(s): CKTOTAL, CKMB, CKMBINDEX, TROPONINI in the last 168 hours.  Lipid Panel: No results for input(s): CHOL, TRIG, HDL, CHOLHDL, VLDL, LDLCALC in the last 168 hours.  Imaging: CT Head Wo Contrast  Result Date: 01/22/2021 CLINICAL DATA:  Altered mental status EXAM: CT HEAD WITHOUT CONTRAST TECHNIQUE: Contiguous axial images were obtained from the base of the skull through the vertex without intravenous contrast. 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 No significant associated mass effect or superimposed hemorrhage identified. Encephalomalacia related to remote right frontal MCA territory cortical infarct again noted. There has been interval evolution of the a right occipital lobe cortical infarct with progressive encephalomalacia in this region. Mild parenchymal volume loss is commensurate with the patient's age. Moderate periventricular white matter changes are again identified likely reflecting the sequela of small vessel ischemia. No abnormal intra or extra-axial mass lesion. No acute intracranial hemorrhage no abnormal mass  effect or midline shift. Ventricular size is normal. Remote right suboccipital craniotomy has been performed with associated cephalo malacia within the right cerebellar hemisphere again noted. Vascular: No asymmetric hyperdense vasculature at the skull base. Skull: No acute fracture. Sinuses/Orbits: The visualized paranasal sinuses are clear. The orbits are unremarkable. Other: The mastoid air cells and middle ear cavities are clear. IMPRESSION: Interval development of a subacute cortical infarct involving the left precentral gyrus with no superimposed hemorrhage or significant associated mass effect. Interval evolution of remote right occipital cortical infarct. Stable encephalomalacia within the right cerebellar hemisphere and right frontal lobe with surgical changes of prior right suboccipital craniotomy again identified. Stable senescent changes. Electronically Signed   By: Fidela Salisbury MD   On: 01/22/2021 04:23   DG Chest Port 1 View  Result Date: 01/22/2021 CLINICAL DATA:  Altered mental status EXAM: PORTABLE CHEST 1 VIEW COMPARISON:  08/19/2020 FINDINGS: Masslike opacity in the right lung base has increased significantly in size since prior study. Increasing airspace opacity at the left base could reflect atelectasis or infiltrate. Heart mildly enlarged. No edema or effusions. IMPRESSION: Enlarging mass at the right lung base. Left base atelectasis or infiltrate. Cardiomegaly. Electronically Signed   By: Rolm Baptise M.D.   On: 01/22/2021 03:14     Assessment: 75 year old female presenting with AMS 1. Exam reveals scattered deficits that are difficult to localize due to poor patient cooperation.  2. CT  head reveals interval development of a subacute cortical infarct involving the left precentral gyrus with no superimposed hemorrhage or significant associated mass effect. Interval evolution of remote right occipital cortical infarct. Stable encephalomalacia within the right cerebellar hemisphere and  right frontal lobe with surgical changes of prior right suboccipital craniotomy again identified. Stable senescent changes.  3. CXR: Enlarging mass at the right lung base. Left base atelectasis or infiltrate. Cardiomegaly. 4. Agree with Hospitalist team that her initial presentation was most consistent with an acute metabolic encephalopathy likely secondary to hypoglycemia. 5. Her subacute stroke may be due to cardioembolic source, artery to artery embolization from atherosclerotic disease, or due to hypercoagulability in the context of her cancer history as well as history of PE. She is on anticoagulation.  6. History of suspected seizures. On Keppra at home.  7. Hypernatremic.   Recommendations: 1. MRI brain with and without contrast to further characterize the subacute stroke, as well as to assess for possible new brain metastases 2. Continue anticoagulation 3. Further recommendations pending MRI results.  4. Continue home-dose Keppra.  5. EEG has been ordered.    Electronically signed: Dr. Kerney Elbe 01/23/2021, 2:23 PM

## 2021-01-23 NOTE — TOC Initial Note (Signed)
Transition of Care [facility]) - Initial/Assessment Note    Patient Details  Name: Cheyenne Anthony MRN: [id number] Date of Birth: 1945-09-15  Transition of Care [facility]) CM/SW Contact:    Shelbie Hutching, RN Phone Number: 01/23/2021, 3:43 PM  Clinical Narrative:                 Patient admitted to the hospital with hypoglycemia.  Patient is part of the [facility] program and lives at home with her daughter.  Patient is pretty much comfort care at home through [facility] services.  Patient's daughter takes care of her along with services from [facility].  Family wants to get her back home as soon as they can.  Juliann Pulse, [phone]) [facility] case manager, reports that [facility] would like to have patient discharge tomorrow and they will continue to manage her care at home.  [facility] will provide her transportation, and will bring her wheelchair with them, patient will need to be hoyer lifted into the wheelchair.   TOC will follow and reach out to [facility] to coordinate discharge tomorrow.   Expected Discharge Plan: [place] Barriers to Discharge: Continued Medical Work up   Patient Goals and CMS Choice Patient states their goals for this hospitalization and ongoing recovery are:: [facility] patient- family wants her back home CMS Medicare.gov Compare Post Acute Care list provided to:: Patient Represenative (must comment) Choice offered to / list presented to : Adult Children  Expected Discharge Plan and Services Expected Discharge Plan: [place]   Discharge Planning Services: CM Consult   Living arrangements for the past 2 months: Single Family Home                 DME Arranged: N/A DME Agency: NA       HH Arranged: NA          Prior Living Arrangements/Services Living arrangements for the past 2 months: Single Family Home Lives with:: Adult Children Patient language and need for interpreter reviewed:: Yes Do you feel safe going back to the place where you live?: Yes      Need for Family  Participation in Patient Care: Yes (Comment) Care giver support system in place?: Yes (comment) Current home services: DME, Homehealth aide ([facility] services, hospital bed, wheelchair, hoyer lift) Criminal Activity/Legal Involvement Pertinent to Current Situation/Hospitalization: No - Comment as needed  Activities of Daily Living Home Assistive Devices/Equipment: Wheelchair ADL Screening (condition at time of admission) Patient's cognitive ability adequate to safely complete daily activities?: No Is the patient deaf or have difficulty hearing?: No Does the patient have difficulty seeing, even when wearing glasses/contacts?: No Does the patient have difficulty concentrating, remembering, or making decisions?: No Patient able to express need for assistance with ADLs?: No Does the patient have difficulty dressing or bathing?: Yes Independently performs ADLs?: No Communication: Independent Grooming: Dependent Does the patient have difficulty walking or climbing stairs?: Yes Weakness of Legs: Both Weakness of Arms/Hands: Both  Permission Sought/Granted Permission sought to share information with : Case Manager, Chartered certified accountant granted to share information with : Yes, Verbal Permission Granted  Share Information with NAME: [name]  Permission granted to share info w AGENCY: [facility]  Permission granted to share info w Relationship: daughter     Emotional Assessment Appearance:: Appears stated age       Alcohol / Substance Use: Not Applicable Psych Involvement: No (comment)  Admission diagnosis:  Hypokalemia [E87.6] Hypoglycemia [E16.2] Altered mental status, unspecified altered mental status type [  R41.82] Patient Active Problem List   Diagnosis Date Noted   Hypoglycemia 01/22/2021   Ankle pain 10/11/2020   Renal mass 10/11/2020   Pulmonary emboli ([place]) 10/11/2020   DVT (deep venous thrombosis) ([place]) 10/11/2020   Adrenal insufficiency, primary ([place])  10/05/2020   Pressure ulcer of coccygeal region, stage 3 ([place]) 10/04/2020   CVA (cerebral vascular accident) ([place]) 10/03/2020   Glaucoma 08/21/2020   Obesity (BMI 30-39.9) 08/21/2020   Low serum cortisol level ([place]) 08/21/2020   S/P craniotomy 08/20/2020   Lung cancer with cerebellar metastases s/p craniotomy 08/02/20 at [facility] 08/20/2020   Possible Seizure ([place]) 08/20/2020   Acute/ subacute CVA  on CT head 08/20/2020   Cerebellar mass    Lung mass    Hypokalemia 90/24/0973   Acute metabolic encephalopathy 53/29/9242   Abnormal CXR with 5 cm round opacity in RML 07/31/2020   LBBB (left bundle branch block) 07/31/2020   Elevated troponin 07/31/2020   HLD (hyperlipidemia) 07/31/2020   Depression 07/31/2020   Hypertension    Type II diabetes mellitus with renal manifestations [facility])    PCP:  [facility]:   [place], [place] - [address] [place] [address] Phone: [phone] Fax: [phone]     Social Determinants of Health (SDOH) Interventions    Readmission Risk Interventions Readmission Risk Prevention Plan 01/23/2021 10/11/2020  Transportation Screening Complete Complete  PCP or Specialist Appt within 3-5 Days Complete Complete  HRI or Home Care Consult Complete Complete  Social Work Consult for [place] Planning/Counseling Complete Complete  Palliative Care Screening Complete Complete  Medication Review Press photographer) Complete Complete  Some recent data might be hidden

## 2021-01-24 LAB — BASIC METABOLIC PANEL
Anion gap: 9 (ref 5–15)
BUN: 8 mg/dL (ref 8–23)
CO2: 25 mmol/L (ref 22–32)
Calcium: 9.6 mg/dL (ref 8.9–10.3)
Chloride: 119 mmol/L — ABNORMAL HIGH (ref 98–111)
Creatinine, Ser: 0.54 mg/dL (ref 0.44–1.00)
GFR, Estimated: >60 mL/min (ref >60)
Glucose, Bld: 115 mg/dL — ABNORMAL HIGH (ref 70–99)
Potassium: 3.3 mmol/L — ABNORMAL LOW (ref 3.5–5.1)
Sodium: 153 mmol/L — ABNORMAL HIGH (ref 135–145)

## 2021-01-24 LAB — GLUCOSE, CAPILLARY
Glucose-Capillary: 165 mg/dL — ABNORMAL HIGH (ref 70–99)
Glucose-Capillary: 162 mg/dL — ABNORMAL HIGH (ref 70–99)

## 2021-01-24 LAB — URINE CULTURE

## 2021-01-24 LAB — PROCALCITONIN: Procalcitonin: 93.24 ng/mL

## 2021-01-24 MED ORDER — POTASSIUM CHLORIDE CRYS ER 20 MEQ PO TBCR
40.0000 meq | EXTENDED_RELEASE_TABLET | Freq: Once | ORAL | Status: AC
  Administered 2021-01-24: 09:00:00 40 meq via ORAL
  Filled 2021-01-24: qty 2

## 2021-01-24 MED ORDER — DEXTROSE 5 % IV SOLN: INTRAVENOUS | Status: DC

## 2021-01-24 MED ORDER — SODIUM CHLORIDE 0.9 % IV SOLN
2.0000 g | INTRAVENOUS | Status: DC
  Filled 2021-01-24: qty 20

## 2021-01-24 NOTE — Discharge Summary (Signed)
Physician Discharge Summary  Cheyenne Anthony JOA:[id number] DOB: 07-17-1946 DOA: 01/22/2021  PCP: [place] date: 01/22/2021 Discharge date: 01/24/2021  Admitted From: Home Disposition:  Home  Recommendations for Outpatient Follow-up:  Follow up with PCP in 1-2 weeks   Home Health: No Equipment/Devices: None  Discharge Condition: Stable CODE STATUS: Full Diet recommendation: Dysphagia  Brief/Interim Summary: Maie Kesinger is a 75 y.o. 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  The patient was found to have an initial blood glucose of 29.  Patient underwent a work-up to determine alternative source for encephalopathy.  Neurology was consulted with recommendations for fast cross-sectional imaging.  MRI brain revealed multiple lesions consistent with metastatic deposits.  Procalcitonin was ordered however is uninterpretable in the setting of active malignancy.  Patient was started on IV antibiotics however these been discontinued at time of discharge.  I had a lengthy discussion with patient's primary care physician [place] with the [facility].  On the day of discharge the patient's glycemic control had improved.  Unlikely that further diagnostic neurologic work-up would be of much benefit.  Per the patient's primary care physician the patient's daughter has done a substantial amount of work to get this patient back home with her.  They understand the patient's diagnosis and the gravity of her situation.  They would like to bring her home to continue her care and she will receive very regular visits with the primary care physician and they have the ability to follow-up on labs.  After our discussion  I reached out to the neurology service who discontinued EEG.  Patient will be discharged home at this time with close follow-up with [facility] physicians.  Discharge Diagnoses:  Active Problems:   Hypoglycemia  Hypoglycemia Acute metabolic encephalopathy secondary to above Patient had a blood glucose of 29 on admission.  This improved after p.o. intake picked up.  CT head with subacute cortical infarct of unclear etiology.  Likely not contributing to acute mental status changes.  Mental status at baseline at time of discharge.  Hypernatremia Sodium 153 and uptrending at time of discharge.  Discussed this with the primary care physician.  Likely due to volume depletion in the setting of poor p.o. intake.  Patient will be followed closely by her outpatient providers with follow-up labs.  Stage IV metastatic lung cancer Patient's primary care physician is aware of the degree of metastatic disease.  Per PCP family considering possible transition to comfort measures however defer this to her established providers.  Discharge Instructions  Discharge Instructions     Diet - low sodium heart healthy   Complete by: As directed    Increase activity slowly   Complete by: As directed       Allergies as of 01/24/2021   No Known Allergies      Medication List     TAKE these medications    apixaban 5 MG Tabs tablet Commonly known as: ELIQUIS Take 5 mg by mouth every 12 (twelve) hours.   atorvastatin 20 MG tablet Commonly known as: LIPITOR Take 1 tablet (20 mg total) by mouth daily.   DULoxetine 60 MG capsule Commonly known as: CYMBALTA Take 60 mg by mouth daily.   HYDROcodone-acetaminophen 5-325 MG tablet Commonly known as: NORCO/VICODIN Take 1 tablet by  mouth 2 (two) times daily.   hydrocortisone 5 MG tablet Commonly known as: CORTEF Take 1 tablet (5 mg total) by mouth daily in the afternoon.   hydrocortisone 10 MG tablet Commonly known as: CORTEF Take 1 tablet (10 mg  total) by mouth in the morning.   insulin glargine 100 UNIT/ML injection Commonly known as: LANTUS Inject 0.1 mLs (10 Units total) into the skin 2 (two) times daily.   latanoprost 0.005 % ophthalmic solution Commonly known as: XALATAN Place 1 drop into both eyes at bedtime.   levETIRAcetam 500 MG tablet Commonly known as: KEPPRA Take 1 tablet (500 mg total) by mouth 2 (two) times daily.   LORazepam Intensol 2 MG/ML concentrated solution Generic drug: LORazepam Take 0.25 mLs by mouth every 2 (two) hours as needed for anxiety or agitation.   morphine CONCENTRATE 10 mg / 0.5 ml concentrated solution Place 0.2 mLs under the tongue every 2 (two) hours.   ondansetron 4 MG disintegrating tablet Commonly known as: ZOFRAN-ODT Take 4 mg by mouth 3 (three) times daily as needed for nausea/vomiting.   senna-docusate 8.6-50 MG tablet Commonly known as: Senokot-S Take 2 tablets by mouth in the morning and at bedtime.   sitaGLIPtin-metformin 50-500 MG tablet Commonly known as: JANUMET Take 1 tablet by mouth in the morning and at bedtime.        No Known Allergies  Consultations: Neurology   Procedures/Studies: CT Head Wo Contrast  Result Date: 01/22/2021 CLINICAL DATA:  Altered mental status EXAM: CT HEAD WITHOUT CONTRAST TECHNIQUE: Contiguous axial images were obtained from the base of the skull through the vertex without intravenous contrast. 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 No significant associated mass effect or superimposed hemorrhage identified. Encephalomalacia related to remote right frontal MCA territory cortical infarct again noted. There has been interval evolution of the a right occipital lobe cortical infarct with progressive encephalomalacia in this region. Mild parenchymal volume loss is commensurate with the patient's age. Moderate periventricular white matter changes are again  identified likely reflecting the sequela of small vessel ischemia. No abnormal intra or extra-axial mass lesion. No acute intracranial hemorrhage no abnormal mass effect or midline shift. Ventricular size is normal. Remote right suboccipital craniotomy has been performed with associated cephalo malacia within the right cerebellar hemisphere again noted. Vascular: No asymmetric hyperdense vasculature at the skull base. Skull: No acute fracture. Sinuses/Orbits: The visualized paranasal sinuses are clear. The orbits are unremarkable. Other: The mastoid air cells and middle ear cavities are clear. IMPRESSION: Interval development of a subacute cortical infarct involving the left precentral gyrus with no superimposed hemorrhage or significant associated mass effect. Interval evolution of remote right occipital cortical infarct. Stable encephalomalacia within the right cerebellar hemisphere and right frontal lobe with surgical changes of prior right suboccipital craniotomy again identified. Stable senescent changes. Electronically Signed   By: Fidela Salisbury MD   On: 01/22/2021 04:23   MR BRAIN W WO CONTRAST  Result Date: 01/23/2021 CLINICAL DATA:  Stroke EXAM: MRI HEAD WITHOUT AND WITH CONTRAST TECHNIQUE: Multiplanar, multiecho pulse sequences of the brain and surrounding structures were obtained without and with intravenous contrast. CONTRAST:  45mL GADAVIST GADOBUTROL 1 MMOL/ML IV SOLN COMPARISON:  None. FINDINGS: Brain: There is no acute infarct. There are old infarcts of the right occipital lobe and right frontal operculum. Postsurgical changes of prior right cerebellar mass resection. There are blood products at the site of a lesion of the left frontal  and within the right cerebellar hemisphere. There are multiple contrast-enhancing lesions throughout the brain. The largest lesion is located in the left frontal lobe and measures 1.9 cm. There is another large lesion in the right occipital lobe measuring 1.5 cm.  There are multiple (7-10) lesions within the cerebellum. There is moderate edema surrounding the left lesion and mild edema around occipital lesion. Mild edema within cerebellar hemispheres. Vascular: Major flow voids are preserved. Skull and upper cervical spine: Remote right suboccipital craniotomy. Sinuses/Orbits:No paranasal sinus fluid levels or advanced mucosal thickening. No mastoid or middle ear effusion. Normal orbits. IMPRESSION: 1. Multiple contrast-enhancing lesions throughout the brain, consistent with metastatic disease. The largest lesion is in the left frontal lobe and measures 1.9 cm. 2. Moderate multifocal edema, greatest in the left frontal lobe. 3. Postsurgical changes of prior right cerebellar lesion resection. 4. 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 Electronically Signed   By: Ulyses Jarred M.D.   On: 01/23/2021 23:05   DG Chest Port 1 View  Result Date: 01/22/2021 CLINICAL DATA:  Altered mental status EXAM: PORTABLE CHEST 1 VIEW COMPARISON:  08/19/2020 FINDINGS: Masslike opacity in the right lung base has increased significantly in size since prior study. Increasing airspace opacity at the left base could reflect atelectasis or infiltrate. Heart mildly enlarged. No edema or effusions. IMPRESSION: Enlarging mass at the right lung base. Left base atelectasis or infiltrate. Cardiomegaly. Electronically Signed   By: Rolm Baptise M.D.   On: 01/22/2021 03:14   (Echo, Carotid, EGD, Colonoscopy, ERCP)    Subjective: Patient seen and examined on the day of discharge.  Mental status at baseline.  Flattened affect but is communicating.  Discharge Exam: Vitals:   01/24/21 1200 01/24/21 1228  BP: 123/78 133/80  Pulse: 96 98  Resp: 16   Temp: 97.6 F (36.4 C) 98.5 F (36.9 C)  SpO2: 96% 100%   Vitals:   01/24/21 0409 01/24/21 0741 01/24/21 1200 01/24/21 1228  BP: 122/69 115/74 123/78 133/80  Pulse: 90 (!) 109 96 98  Resp: 18 16 16    Temp: 98.3 F  (36.8 C) 98.9 F (37.2 C) 97.6 F (36.4 C) 98.5 F (36.9 C)  TempSrc: Oral  Oral   SpO2: 95% 97% 96% 100%  Weight:        General: Patient is awake in no visible distress Cardiovascular: RRR, S1/S2 +, no rubs, no gallops Respiratory: CTA bilaterally, no wheezing, no rhonchi Abdominal: Soft, NT, ND, bowel sounds + Extremities: no edema, no cyanosis    The results of significant diagnostics from this hospitalization (including imaging, microbiology, ancillary and laboratory) are listed below for reference.     Microbiology: Recent Results (from the past 240 hour(s))  Culture, blood (routine x 2)     Status: None (Preliminary result)   Collection Time: 01/22/21  3:05 AM   Specimen: BLOOD  Result Value Ref Range Status   Specimen Description BLOOD BLOOD RIGHT ARM  Final   Special Requests   Final    BOTTLES DRAWN AEROBIC AND ANAEROBIC Blood Culture adequate volume   Culture   Final    NO GROWTH 2 DAYS Performed at [facility], [address] [place], [place] [address]    Report Status PENDING  Incomplete  Culture, blood (routine x 2)     Status: None (Preliminary result)   Collection Time: 01/22/21  3:05 AM   Specimen: BLOOD  Result Value Ref Range Status   Specimen Description BLOOD BLOOD RIGHT  ARM  Final   Special Requests   Final    BOTTLES DRAWN AEROBIC AND ANAEROBIC Blood Culture results may not be optimal due to an inadequate volume of blood received in culture bottles   Culture   Final    NO GROWTH 2 DAYS Performed at [facility], [address], [place], [place] [address]    Report Status PENDING  Incomplete  Resp Panel by RT-PCR (Flu A&B, Covid) Nasopharyngeal Swab     Status: None   Collection Time: 01/22/21  3:05 AM   Specimen: Nasopharyngeal Swab; Nasopharyngeal(NP) swabs in vial transport medium  Result Value Ref Range Status   SARS Coronavirus 2 by RT PCR NEGATIVE NEGATIVE Final    Comment: (NOTE) SARS-CoV-2 target nucleic acids  are NOT DETECTED.  The SARS-CoV-2 RNA is generally detectable in upper respiratory specimens during the acute phase of infection. The lowest concentration of SARS-CoV-2 viral copies this assay can detect is 138 copies/mL. A negative result does not preclude SARS-Cov-2 infection and should not be used as the sole basis for treatment or other patient management decisions. A negative result may occur with  improper specimen collection/handling, submission of specimen other than nasopharyngeal swab, presence of viral mutation(s) within the areas targeted by this assay, and inadequate number of viral copies(<138 copies/mL). A negative result must be combined with clinical observations, patient history, and epidemiological information. The expected result is Negative.  Fact Sheet for Patients:  EntrepreneurPulse.com.au  Fact Sheet for Healthcare Providers:  IncredibleEmployment.be  This test is no t yet approved or cleared by the [place] FDA and  has been authorized for detection and/or diagnosis of SARS-CoV-2 by FDA under an Emergency Use Authorization (EUA). This EUA will remain  in effect (meaning this test can be used) for the duration of the COVID-19 declaration under Section 564(b)(1) of the Act, 21 U.S.C.section 360bbb-3(b)(1), unless the authorization is terminated  or revoked sooner.       Influenza A by PCR NEGATIVE NEGATIVE Final   Influenza B by PCR NEGATIVE NEGATIVE Final    Comment: (NOTE) The Xpert Xpress SARS-CoV-2/FLU/RSV plus assay is intended as an aid in the diagnosis of influenza from Nasopharyngeal swab specimens and should not be used as a sole basis for treatment. Nasal washings and aspirates are unacceptable for Xpert Xpress SARS-CoV-2/FLU/RSV testing.  Fact Sheet for Patients: EntrepreneurPulse.com.au  Fact Sheet for Healthcare Providers: IncredibleEmployment.be  This test is  not yet approved or cleared by the [place] FDA and has been authorized for detection and/or diagnosis of SARS-CoV-2 by FDA under an Emergency Use Authorization (EUA). This EUA will remain in effect (meaning this test can be used) for the duration of the COVID-19 declaration under Section 564(b)(1) of the Act, 21 U.S.C. section 360bbb-3(b)(1), unless the authorization is terminated or revoked.  Performed at [facility], [address], [place], [place] [address]   Urine culture     Status: Abnormal (Preliminary result)   Collection Time: 01/22/21  7:38 AM   Specimen: Urine, Random  Result Value Ref Range Status   Specimen Description   Final    URINE, RANDOM Performed at [facility], [address] [place], [place] [address]    Special Requests   Final    NONE Performed at [facility], [address] [place], [place] [address]    Culture (A)  Final    10,000 COLONIES/mL ESCHERICHIA COLI SUSCEPTIBILITIES TO FOLLOW CULTURE REINCUBATED FOR BETTER GROWTH Performed at [facility], [address] [place], [place] [address]    Report Status PENDING  Incomplete  MRSA Next Gen by PCR, Nasal     Status: None   Collection Time: 01/22/21  1:27 PM   Specimen: Nasal Mucosa; Nasal Swab  Result Value Ref Range Status   MRSA by PCR Next Gen NOT DETECTED NOT DETECTED Final    Comment: (NOTE) The GeneXpert MRSA Assay (FDA approved for NASAL specimens only), is one component of a comprehensive MRSA colonization surveillance program. It is not intended to diagnose MRSA infection nor to guide or monitor treatment for MRSA infections. Test performance is not FDA approved in patients less than 47 years old. Performed at [facility], [address] [place], [place] [address]      Labs: BNP (last 3 results) Recent Labs    07/31/20 1110  BNP 098.1*   Basic Metabolic Panel: Recent Labs  Lab 01/22/21 0305 01/23/21 0446  01/24/21 0608  NA 139 150* 153*  K 3.0* 2.9* 3.3*  CL 105 116* 119*  CO2 28 26 25   GLUCOSE 122* 153* 115*  BUN 10 8 8   CREATININE 0.55 0.51 0.54  CALCIUM 8.7* 9.4 9.6   Liver Function Tests: Recent Labs  Lab 01/22/21 0305  AST 18  ALT 7  ALKPHOS 73  BILITOT 0.5  PROT 6.4*  ALBUMIN 2.3*   No results for input(s): LIPASE, AMYLASE in the last 168 hours. No results for input(s): AMMONIA in the last 168 hours. CBC: Recent Labs  Lab 01/22/21 0305 01/23/21 0446  WBC 6.2 8.7  NEUTROABS 4.3  --   HGB 8.3* 9.5*  HCT 28.6* 31.5*  MCV 80.6 78.9*  PLT 556* 586*   Cardiac Enzymes: No results for input(s): CKTOTAL, CKMB, CKMBINDEX, TROPONINI in the last 168 hours. BNP: Invalid input(s): POCBNP CBG: Recent Labs  Lab 01/23/21 1157 01/23/21 1641 01/23/21 2132 01/24/21 0740 01/24/21 1221  GLUCAP 151* 109* 155* 165* 162*   D-Dimer No results for input(s): DDIMER in the last 72 hours. Hgb A1c Recent Labs    01/22/21 0305  HGBA1C 5.6   Lipid Profile No results for input(s): CHOL, HDL, LDLCALC, TRIG, CHOLHDL, LDLDIRECT in the last 72 hours. Thyroid function studies No results for input(s): TSH, T4TOTAL, T3FREE, THYROIDAB in the last 72 hours.  Invalid input(s): FREET3 Anemia work up No results for input(s): VITAMINB12, FOLATE, FERRITIN, TIBC, IRON, RETICCTPCT in the last 72 hours. Urinalysis    Component Value Date/Time   COLORURINE STRAW (A) 01/22/2021 0738   APPEARANCEUR HAZY (A) 01/22/2021 0738   LABSPEC 1.001 (L) 01/22/2021 0738   PHURINE 7.0 01/22/2021 0738   GLUCOSEU NEGATIVE 01/22/2021 0738   HGBUR SMALL (A) 01/22/2021 0738   BILIRUBINUR NEGATIVE 01/22/2021 0738   KETONESUR NEGATIVE 01/22/2021 0738   PROTEINUR NEGATIVE 01/22/2021 0738   NITRITE NEGATIVE 01/22/2021 0738   LEUKOCYTESUR LARGE (A) 01/22/2021 0738   Sepsis Labs Invalid input(s): PROCALCITONIN,  WBC,  LACTICIDVEN Microbiology Recent Results (from the past 240 hour(s))  Culture, blood  (routine x 2)     Status: None (Preliminary result)   Collection Time: 01/22/21  3:05 AM   Specimen: BLOOD  Result Value Ref Range Status   Specimen Description BLOOD BLOOD RIGHT ARM  Final   Special Requests   Final    BOTTLES DRAWN AEROBIC AND ANAEROBIC Blood Culture adequate volume   Culture   Final    NO GROWTH 2 DAYS Performed at [facility], [address] [place], [place]  [address]    Report Status PENDING  Incomplete  Culture, blood (routine x 2)     Status: None (Preliminary result)   Collection Time: 01/22/21  3:05 AM   Specimen: BLOOD  Result Value Ref Range Status   Specimen Description BLOOD BLOOD RIGHT ARM  Final   Special Requests   Final    BOTTLES DRAWN AEROBIC AND ANAEROBIC Blood Culture results may not be optimal due to an inadequate volume of blood received in culture bottles   Culture   Final    NO GROWTH 2 DAYS Performed at [facility], [address], [place], [place] [address]    Report Status PENDING  Incomplete  Resp Panel by RT-PCR (Flu A&B, Covid) Nasopharyngeal Swab     Status: None   Collection Time: 01/22/21  3:05 AM   Specimen: Nasopharyngeal Swab; Nasopharyngeal(NP) swabs in vial transport medium  Result Value Ref Range Status   SARS Coronavirus 2 by RT PCR NEGATIVE NEGATIVE Final    Comment: (NOTE) SARS-CoV-2 target nucleic acids are NOT DETECTED.  The SARS-CoV-2 RNA is generally detectable in upper respiratory specimens during the acute phase of infection. The lowest concentration of SARS-CoV-2 viral copies this assay can detect is 138 copies/mL. A negative result does not preclude SARS-Cov-2 infection and should not be used as the sole basis for treatment or other patient management decisions. A negative result may occur with  improper specimen collection/handling, submission of specimen other than nasopharyngeal swab, presence of viral mutation(s) within the areas targeted by this assay, and inadequate number of  viral copies(<138 copies/mL). A negative result must be combined with clinical observations, patient history, and epidemiological information. The expected result is Negative.  Fact Sheet for Patients:  EntrepreneurPulse.com.au  Fact Sheet for Healthcare Providers:  IncredibleEmployment.be  This test is no t yet approved or cleared by the [place] FDA and  has been authorized for detection and/or diagnosis of SARS-CoV-2 by FDA under an Emergency Use Authorization (EUA). This EUA will remain  in effect (meaning this test can be used) for the duration of the COVID-19 declaration under Section 564(b)(1) of the Act, 21 U.S.C.section 360bbb-3(b)(1), unless the authorization is terminated  or revoked sooner.       Influenza A by PCR NEGATIVE NEGATIVE Final   Influenza B by PCR NEGATIVE NEGATIVE Final    Comment: (NOTE) The Xpert Xpress SARS-CoV-2/FLU/RSV plus assay is intended as an aid in the diagnosis of influenza from Nasopharyngeal swab specimens and should not be used as a sole basis for treatment. Nasal washings and aspirates are unacceptable for Xpert Xpress SARS-CoV-2/FLU/RSV testing.  Fact Sheet for Patients: EntrepreneurPulse.com.au  Fact Sheet for Healthcare Providers: IncredibleEmployment.be  This test is not yet approved or cleared by the [place] FDA and has been authorized for detection and/or diagnosis of SARS-CoV-2 by FDA under an Emergency Use Authorization (EUA). This EUA will remain in effect (meaning this test can be used) for the duration of the COVID-19 declaration under Section 564(b)(1) of the Act, 21 U.S.C. section 360bbb-3(b)(1), unless the authorization is terminated or revoked.  Performed at [facility], [address] [place], [place] [address]   Urine culture     Status: Abnormal (Preliminary result)   Collection Time: 01/22/21  7:38 AM   Specimen:  Urine, Random  Result Value Ref Range Status   Specimen Description   Final    URINE, RANDOM Performed at [facility], [address] [place], [place] [address]  Special Requests   Final    NONE Performed at [facility], [place], [place] [address]    Culture (A)  Final    10,000 COLONIES/mL ESCHERICHIA COLI SUSCEPTIBILITIES TO FOLLOW CULTURE REINCUBATED FOR BETTER GROWTH Performed at [facility], [place] [address], [place], [place] [address]    Report Status PENDING  Incomplete  MRSA Next Gen by PCR, Nasal     Status: None   Collection Time: 01/22/21  1:27 PM   Specimen: Nasal Mucosa; Nasal Swab  Result Value Ref Range Status   MRSA by PCR Next Gen NOT DETECTED NOT DETECTED Final    Comment: (NOTE) The GeneXpert MRSA Assay (FDA approved for NASAL specimens only), is one component of a comprehensive MRSA colonization surveillance program. It is not intended to diagnose MRSA infection nor to guide or monitor treatment for MRSA infections. Test performance is not FDA approved in patients less than 73 years old. Performed at [facility], [address] [place], [place] [address]      Time coordinating discharge: Over 30 minutes  SIGNED:   Sidney Ace, MD  [facility] 01/24/2021, 12:40 PM Pager   If 7PM-7AM, please contact night-coverage

## 2021-01-24 NOTE — Plan of Care (Signed)
Patient is being discharged home with daughter and assisstance with [facility]. Ivs have been removed, discharge instructions havebeen given. Family educated on discharge instructions.

## 2021-01-24 NOTE — Progress Notes (Addendum)
Subjective: Eating breakfast with daughter's help..   Objective: Current vital signs: BP 115/74 (BP Location: Right Arm)   Pulse (!) 109   Temp 98.9 F (37.2 C)   Resp 16   Wt 89.7 kg   SpO2 97%   BMI 33.94 kg/m  Vital signs in last 24 hours: Temp:  [97.9 F (36.6 C)-98.9 F (37.2 C)] 98.9 F (37.2 C) (06/29 0741) Pulse Rate:  [90-109] 109 (06/29 0741) Resp:  [16-18] 16 (06/29 0741) BP: (115-156)/(69-87) 115/74 (06/29 0741) SpO2:  [93 %-97 %] 97 % (06/29 0741)  Intake/Output from previous day: 06/28 0701 - 06/29 0700 In: 1200 [P.O.:1200] Out: 3650 [Urine:3650] Intake/Output this shift: No intake/output data recorded. Nutritional status:  Diet Order             Diet heart healthy/carb modified Room service appropriate? Yes; Fluid consistency: Thin  Diet effective now                  HEENT: Normocephalic Lungs: Respirations unlabored Ext: Warm and well perfused  Neurologic Exam: Ment: Awake. Answers some simple questions asked by her daughter, such as family members' names. Was not able to state that she is in the hospital. Will follow simple motor commands. Moderate right hemineglect is noted.  CN: Left sided gaze preference, but can cross to the right. Right facial droop. Temporal visual fields grossly intact to confrontation on right and left. Hypophonic speech.  Motor: Lifts BUE antigravity to pain without gross asymmetry. Squeezes examiner's hands bilatearally with 4/5 strength to command. Delayed movements on left relative to right.  BLE: Wiggles feet to command. Does not elevate lower extremities to command. There is pain with passive movement of RLE by examiner.  Cerebellar: No ataxia noted. Tremor in bilateral hands is present Gait: Unable to assess  Lab Results: Results for orders placed or performed during the hospital encounter of 01/22/21 (from the past 48 hour(s))  CBG monitoring, ED     Status: Abnormal   Collection Time: 01/22/21 11:14 AM   Result Value Ref Range   Glucose-Capillary 178 (H) 70 - 99 mg/dL    Comment: Glucose reference range applies only to samples taken after fasting for at least 8 hours.  MRSA Next Gen by PCR, Nasal     Status: None   Collection Time: 01/22/21  1:27 PM   Specimen: Nasal Mucosa; Nasal Swab  Result Value Ref Range   MRSA by PCR Next Gen NOT DETECTED NOT DETECTED    Comment: (NOTE) The GeneXpert MRSA Assay (FDA approved for NASAL specimens only), is one component of a comprehensive MRSA colonization surveillance program. It is not intended to diagnose MRSA infection nor to guide or monitor treatment for MRSA infections. Test performance is not FDA approved in patients less than 73 years old. Performed at [facility], [address] [place], [place] [address]   CBG monitoring, ED     Status: Abnormal   Collection Time: 01/22/21  4:51 PM  Result Value Ref Range   Glucose-Capillary 214 (H) 70 - 99 mg/dL    Comment: Glucose reference range applies only to samples taken after fasting for at least 8 hours.  Glucose, capillary     Status: Abnormal   Collection Time: 01/22/21 10:29 PM  Result Value Ref Range   Glucose-Capillary 143 (H) 70 - 99 mg/dL    Comment: Glucose reference range applies only to samples taken after fasting for at least 8 hours.  Basic metabolic panel  Status: Abnormal   Collection Time: 01/23/21  4:46 AM  Result Value Ref Range   Sodium 150 (H) 135 - 145 mmol/L   Potassium 2.9 (L) 3.5 - 5.1 mmol/L   Chloride 116 (H) 98 - 111 mmol/L   CO2 26 22 - 32 mmol/L   Glucose, Bld 153 (H) 70 - 99 mg/dL    Comment: Glucose reference range applies only to samples taken after fasting for at least 8 hours.   BUN 8 8 - 23 mg/dL   Creatinine, Ser 0.51 0.44 - 1.00 mg/dL   Calcium 9.4 8.9 - 10.3 mg/dL   GFR, Estimated >60 >60 mL/min    Comment: (NOTE) Calculated using the CKD-EPI Creatinine Equation (2021)    Anion gap 8 5 - 15    Comment: Performed at [facility], [address] [place], [place] [address]  CBC     Status: Abnormal   Collection Time: 01/23/21  4:46 AM  Result Value Ref Range   WBC 8.7 4.0 - 10.5 K/uL   RBC 3.99 3.87 - 5.11 MIL/uL   Hemoglobin 9.5 (L) 12.0 - 15.0 g/dL   HCT 31.5 (L) 36.0 - 46.0 %   MCV 78.9 (L) 80.0 - 100.0 fL   MCH 23.8 (L) 26.0 - 34.0 pg   MCHC 30.2 30.0 - 36.0 g/dL   RDW 18.6 (H) 11.5 - 15.5 %   Platelets 586 (H) 150 - 400 K/uL   nRBC 0.0 0.0 - 0.2 %    Comment: Performed at [facility], [address] [place], [place] [address]  Glucose, capillary     Status: Abnormal   Collection Time: 01/23/21  6:37 AM  Result Value Ref Range   Glucose-Capillary 143 (H) 70 - 99 mg/dL    Comment: Glucose reference range applies only to samples taken after fasting for at least 8 hours.  Glucose, capillary     Status: Abnormal   Collection Time: 01/23/21  8:25 AM  Result Value Ref Range   Glucose-Capillary 146 (H) 70 - 99 mg/dL    Comment: Glucose reference range applies only to samples taken after fasting for at least 8 hours.  Glucose, capillary     Status: Abnormal   Collection Time: 01/23/21 11:57 AM  Result Value Ref Range   Glucose-Capillary 151 (H) 70 - 99 mg/dL    Comment: Glucose reference range applies only to samples taken after fasting for at least 8 hours.  Procalcitonin - Baseline     Status: None   Collection Time: 01/23/21  2:05 PM  Result Value Ref Range   Procalcitonin 93.68 ng/mL    Comment:        Interpretation: PCT >= 10 ng/mL: Important systemic inflammatory response, almost exclusively due to severe bacterial sepsis or septic shock. (NOTE)       Sepsis PCT Algorithm           Lower Respiratory Tract                                      Infection PCT Algorithm    ----------------------------     ----------------------------         PCT < 0.25 ng/mL                PCT < 0.10 ng/mL          Strongly encourage  Strongly discourage   discontinuation of  antibiotics    initiation of antibiotics    ----------------------------     -----------------------------       PCT 0.25 - 0.50 ng/mL            PCT 0.10 - 0.25 ng/mL               OR       >80% decrease in PCT            Discourage initiation of                                            antibiotics      Encourage discontinuation           of antibiotics    ----------------------------     -----------------------------         PCT >= 0.50 ng/mL              PCT 0.26 - 0.50 ng/mL                AND       <80% decrease in PCT             Encourage initiation of                                             antibiotics       Encourage continuation           of antibiotics    ----------------------------     -----------------------------        PCT >= 0.50 ng/mL                  PCT > 0.50 ng/mL               AND         increase in PCT                  Strongly encourage                                      initiation of antibiotics    Strongly encourage escalation           of antibiotics                                     -----------------------------                                           PCT <= 0.25 ng/mL                                                 OR                                        >  80% decrease in PCT                                      Discontinue / Do not initiate                                             antibiotics  Performed at [facility], [address] [place], [place] [address]   Glucose, capillary     Status: Abnormal   Collection Time: 01/23/21  4:41 PM  Result Value Ref Range   Glucose-Capillary 109 (H) 70 - 99 mg/dL    Comment: Glucose reference range applies only to samples taken after fasting for at least 8 hours.  Glucose, capillary     Status: Abnormal   Collection Time: 01/23/21  9:32 PM  Result Value Ref Range   Glucose-Capillary 155 (H) 70 - 99 mg/dL    Comment: Glucose reference range applies only to samples taken after  fasting for at least 8 hours.  Procalcitonin     Status: None   Collection Time: 01/24/21  6:08 AM  Result Value Ref Range   Procalcitonin 93.24 ng/mL    Comment:        Interpretation: PCT >= 10 ng/mL: Important systemic inflammatory response, almost exclusively due to severe bacterial sepsis or septic shock. (NOTE)       Sepsis PCT Algorithm           Lower Respiratory Tract                                      Infection PCT Algorithm    ----------------------------     ----------------------------         PCT < 0.25 ng/mL                PCT < 0.10 ng/mL          Strongly encourage             Strongly discourage   discontinuation of antibiotics    initiation of antibiotics    ----------------------------     -----------------------------       PCT 0.25 - 0.50 ng/mL            PCT 0.10 - 0.25 ng/mL               OR       >80% decrease in PCT            Discourage initiation of                                            antibiotics      Encourage discontinuation           of antibiotics    ----------------------------     -----------------------------         PCT >= 0.50 ng/mL              PCT 0.26 - 0.50 ng/mL                AND       <  80% decrease in PCT             Encourage initiation of                                             antibiotics       Encourage continuation           of antibiotics    ----------------------------     -----------------------------        PCT >= 0.50 ng/mL                  PCT > 0.50 ng/mL               AND         increase in PCT                  Strongly encourage                                      initiation of antibiotics    Strongly encourage escalation           of antibiotics                                     -----------------------------                                           PCT <= 0.25 ng/mL                                                 OR                                        > 80% decrease in PCT                                       Discontinue / Do not initiate                                             antibiotics  Performed at [facility], [address] [place], [place] [address]   Basic metabolic panel     Status: Abnormal   Collection Time: 01/24/21  6:08 AM  Result Value Ref Range   Sodium 153 (H) 135 - 145 mmol/L   Potassium 3.3 (L) 3.5 - 5.1 mmol/L   Chloride 119 (H) 98 - 111 mmol/L   CO2 25 22 - 32 mmol/L   Glucose, Bld 115 (H) 70 - 99 mg/dL    Comment: Glucose reference range applies only to samples taken after fasting for at least 8 hours.   BUN 8 8 - 23  mg/dL   Creatinine, Ser 0.54 0.44 - 1.00 mg/dL   Calcium 9.6 8.9 - 10.3 mg/dL   GFR, Estimated >60 >60 mL/min    Comment: (NOTE) Calculated using the CKD-EPI Creatinine Equation (2021)    Anion gap 9 5 - 15    Comment: Performed at [facility], [address] [place], [place] [address]  Glucose, capillary     Status: Abnormal   Collection Time: 01/24/21  7:40 AM  Result Value Ref Range   Glucose-Capillary 165 (H) 70 - 99 mg/dL    Comment: Glucose reference range applies only to samples taken after fasting for at least 8 hours.    Recent Results (from the past 240 hour(s))  Culture, blood (routine x 2)     Status: None (Preliminary result)   Collection Time: 01/22/21  3:05 AM   Specimen: BLOOD  Result Value Ref Range Status   Specimen Description BLOOD BLOOD RIGHT ARM  Final   Special Requests   Final    BOTTLES DRAWN AEROBIC AND ANAEROBIC Blood Culture adequate volume   Culture   Final    NO GROWTH 2 DAYS Performed at [facility], [address] [place], [place] [address]    Report Status PENDING  Incomplete  Culture, blood (routine x 2)     Status: None (Preliminary result)   Collection Time: 01/22/21  3:05 AM   Specimen: BLOOD  Result Value Ref Range Status   Specimen Description BLOOD BLOOD RIGHT ARM  Final   Special Requests   Final    BOTTLES DRAWN AEROBIC AND ANAEROBIC Blood  Culture results may not be optimal due to an inadequate volume of blood received in culture bottles   Culture   Final    NO GROWTH 2 DAYS Performed at [facility], [address] [place], [place] [address]    Report Status PENDING  Incomplete  Resp Panel by RT-PCR (Flu A&B, Covid) Nasopharyngeal Swab     Status: None   Collection Time: 01/22/21  3:05 AM   Specimen: Nasopharyngeal Swab; Nasopharyngeal(NP) swabs in vial transport medium  Result Value Ref Range Status   SARS Coronavirus 2 by RT PCR NEGATIVE NEGATIVE Final    Comment: (NOTE) SARS-CoV-2 target nucleic acids are NOT DETECTED.  The SARS-CoV-2 RNA is generally detectable in upper respiratory specimens during the acute phase of infection. The lowest concentration of SARS-CoV-2 viral copies this assay can detect is 138 copies/mL. A negative result does not preclude SARS-Cov-2 infection and should not be used as the sole basis for treatment or other patient management decisions. A negative result may occur with  improper specimen collection/handling, submission of specimen other than nasopharyngeal swab, presence of viral mutation(s) within the areas targeted by this assay, and inadequate number of viral copies(<138 copies/mL). A negative result must be combined with clinical observations, patient history, and epidemiological information. The expected result is Negative.  Fact Sheet for Patients:  EntrepreneurPulse.com.au  Fact Sheet for Healthcare Providers:  IncredibleEmployment.be  This test is no t yet approved or cleared by the [place] FDA and  has been authorized for detection and/or diagnosis of SARS-CoV-2 by FDA under an Emergency Use Authorization (EUA). This EUA will remain  in effect (meaning this test can be used) for the duration of the COVID-19 declaration under Section 564(b)(1) of the Act, 21 U.S.C.section 360bbb-3(b)(1), unless the authorization is  terminated  or revoked sooner.       Influenza A by PCR NEGATIVE NEGATIVE Final  Influenza B by PCR NEGATIVE NEGATIVE Final    Comment: (NOTE) The Xpert Xpress SARS-CoV-2/FLU/RSV plus assay is intended as an aid in the diagnosis of influenza from Nasopharyngeal swab specimens and should not be used as a sole basis for treatment. Nasal washings and aspirates are unacceptable for Xpert Xpress SARS-CoV-2/FLU/RSV testing.  Fact Sheet for Patients: EntrepreneurPulse.com.au  Fact Sheet for Healthcare Providers: IncredibleEmployment.be  This test is not yet approved or cleared by the [place] FDA and has been authorized for detection and/or diagnosis of SARS-CoV-2 by FDA under an Emergency Use Authorization (EUA). This EUA will remain in effect (meaning this test can be used) for the duration of the COVID-19 declaration under Section 564(b)(1) of the Act, 21 U.S.C. section 360bbb-3(b)(1), unless the authorization is terminated or revoked.  Performed at [facility], [address] [place], [place] [address]   Urine culture     Status: Abnormal (Preliminary result)   Collection Time: 01/22/21  7:38 AM   Specimen: Urine, Random  Result Value Ref Range Status   Specimen Description   Final    URINE, RANDOM Performed at [facility], [address], [place], [place] [address]    Special Requests   Final    NONE Performed at [facility], [address] [place], [place] [address]    Culture (A)  Final    10,000 COLONIES/mL ESCHERICHIA COLI SUSCEPTIBILITIES TO FOLLOW CULTURE REINCUBATED FOR BETTER GROWTH Performed at [facility], [place] [address], [place], [place] [address]    Report Status PENDING  Incomplete  MRSA Next Gen by PCR, Nasal     Status: None   Collection Time: 01/22/21  1:27 PM   Specimen: Nasal Mucosa; Nasal Swab  Result Value Ref Range Status   MRSA by PCR Next Gen NOT DETECTED NOT  DETECTED Final    Comment: (NOTE) The GeneXpert MRSA Assay (FDA approved for NASAL specimens only), is one component of a comprehensive MRSA colonization surveillance program. It is not intended to diagnose MRSA infection nor to guide or monitor treatment for MRSA infections. Test performance is not FDA approved in patients less than 43 years old. Performed at [facility], [address] [place],  [address]     Lipid Panel No results for input(s): CHOL, TRIG, HDL, CHOLHDL, VLDL, LDLCALC in the last 72 hours.  Studies/Results: MR BRAIN W WO CONTRAST  Result Date: 01/23/2021 CLINICAL DATA:  Stroke EXAM: MRI HEAD WITHOUT AND WITH CONTRAST TECHNIQUE: Multiplanar, multiecho pulse sequences of the brain and surrounding structures were obtained without and with intravenous contrast. CONTRAST:  76mL GADAVIST GADOBUTROL 1 MMOL/ML IV SOLN COMPARISON:  None. FINDINGS: Brain: There is no acute infarct. There are old infarcts of the right occipital lobe and right frontal operculum. Postsurgical changes of prior right cerebellar mass resection. There are blood products at the site of a lesion of the left frontal and within the right cerebellar hemisphere. There are multiple contrast-enhancing lesions throughout the brain. The largest lesion is located in the left frontal lobe and measures 1.9 cm. There is another large lesion in the right occipital lobe measuring 1.5 cm. There are multiple (7-10) lesions within the cerebellum. There is moderate edema surrounding the left lesion and mild edema around occipital lesion. Mild edema within cerebellar hemispheres. Vascular: Major flow voids are preserved. Skull and upper cervical spine: Remote right suboccipital craniotomy. Sinuses/Orbits:No paranasal sinus fluid levels or advanced mucosal thickening. No mastoid or middle ear effusion. Normal orbits. IMPRESSION:  1. Multiple contrast-enhancing lesions throughout the brain, consistent with metastatic  disease. The largest lesion is in the left frontal lobe and measures 1.9 cm. 2. Moderate multifocal edema, greatest in the left frontal lobe. 3. Postsurgical changes of prior right cerebellar lesion resection. 4. 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 Electronically Signed   By: Ulyses Jarred M.D.   On: 01/23/2021 23:05    Medications: Scheduled:  apixaban  5 mg Oral Q12H   atorvastatin  20 mg Oral Daily   DULoxetine  60 mg Oral Daily   HYDROcodone-acetaminophen  1 tablet Oral BID   hydrocortisone  10 mg Oral q AM   hydrocortisone  5 mg Oral Q1500   insulin aspart  0-9 Units Subcutaneous TID AC & HS   latanoprost  1 drop Both Eyes QHS   levETIRAcetam  500 mg Oral BID   potassium chloride  40 mEq Oral Once   Continuous:  ceFEPime (MAXIPIME) IV 2 g (01/24/21 0610)   dextrose     vancomycin 1,000 mg (01/23/21 1627)    Assessment: 75 year old female presenting with AMS in the context of lung cancer with brain metastases. Initial CT showed possible new left precentral gyrus stroke, which is revealed by subsequent MRI to be more consistent with a new metastatic lesion.  1. Exam reveals scattered deficits that are difficult to localize due to poor patient cooperation. Most reliable exam finding is moderate right sided neglect, most likely secondary to the new left frontal lobe metastatic lesion seen on MRI.  2. MRI brain reveals multiple contrast-enhancing lesions throughout the brain, consistent with metastatic disease. The largest lesion is in the left frontal lobe and measures 1.9 cm. Moderate multifocal edema, greatest in the left frontal lobe. Postsurgical changes of prior right cerebellar lesion resection. 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  3.  CXR: Enlarging mass at the right lung base. Left base atelectasis or infiltrate. Cardiomegaly. 4. Agree with Hospitalist team that her initial presentation was most consistent with an acute metabolic encephalopathy likely secondary to hypoglycemia and infection. Hypernatremic as well, to which volume depletion could be contributing.  5. History of suspected seizures. On Keppra at home.    Recommendations: 1. EEG is pending.  2. Continue anticoagulation 3. Continue home-dose Keppra.  4. Given extensive cerebral metastatic disease, Palliative Care consult may be of benefit for discussion of long-term goals of care. May need to discuss wtih Oncology as well.  5. Neurohospitalist service will most likely sign off if EEG shows no electrographic seizures or epileptiform discharges.    LOS: 1 day   @Electronically  signed: Dr. Kerney Elbe 01/24/2021  8:34 AM

## 2021-01-24 NOTE — TOC Transition Note (Signed)
Transition of Care [facility]) - CM/SW Discharge Note   Patient Details  Name: Cheyenne Anthony MRN: [id number] Date of Birth: 04-Jan-1946  [facility] CM/SW Contact:  Shelbie Hutching, RN Phone Number: 01/24/2021, 12:52 PM   Clinical Narrative:    Patient is being discharged into the care of [facility] at home.  Patient's daughter at the bedside and bedside RN will review discharge information.  Mariana Single Case Manager has arranged transportation.  Transport has brought the patient's wheelchair.  Hospital staff will assist patient into the wheelchair using a hoyer lift, Claudia Desanctis will transport home.  [facility] MD Dr. Ovid Curd has spoken with hospitalist about discharge.  [facility] will manage patient's ongoing care at home.     Final next level of care: Other (comment) (Home with [facility]) Barriers to Discharge: Barriers Resolved   Patient Goals and CMS Choice Patient states their goals for this hospitalization and ongoing recovery are:: [facility] patient- family wants her back home CMS Medicare.gov Compare Post Acute Care list provided to:: Patient Represenative (must comment) Choice offered to / list presented to : Adult Children  Discharge Placement                Patient to be transferred to facility by: [facility] transport patient home Name of family member notified: [name] Patient and family notified of of transfer: 01/24/21  Discharge Plan and Services   Discharge Planning Services: CM Consult            DME Arranged: N/A DME Agency: NA       HH Arranged: NA          Social Determinants of Health (SDOH) Interventions     Readmission Risk Interventions Readmission Risk Prevention Plan 01/23/2021 10/11/2020  Transportation Screening Complete Complete  PCP or Specialist Appt within 3-5 Days Complete Complete  HRI or Home Care Consult Complete Complete  Social Work Consult for [place] Planning/Counseling Complete Complete  Palliative Care Screening Complete Complete  Medication Review  Press photographer) Complete Complete  Some recent data might be hidden

## 2021-01-27 LAB — CULTURE, BLOOD (ROUTINE X 2)
Culture: NO GROWTH
Culture: NO GROWTH
Special Requests: ADEQUATE

## 2021-02-26 DEATH — deceased

## 2022-06-17 IMAGING — US US EXTREM LOW VENOUS
1 series · 13 of 24 positions shown · non-contrast
Comparison: None.

CLINICAL DATA: History of DVT



[Series 1: us venous img lower bilat (dvt) · portal-venous · 13 of 59 slices shown]
[im 1/59]
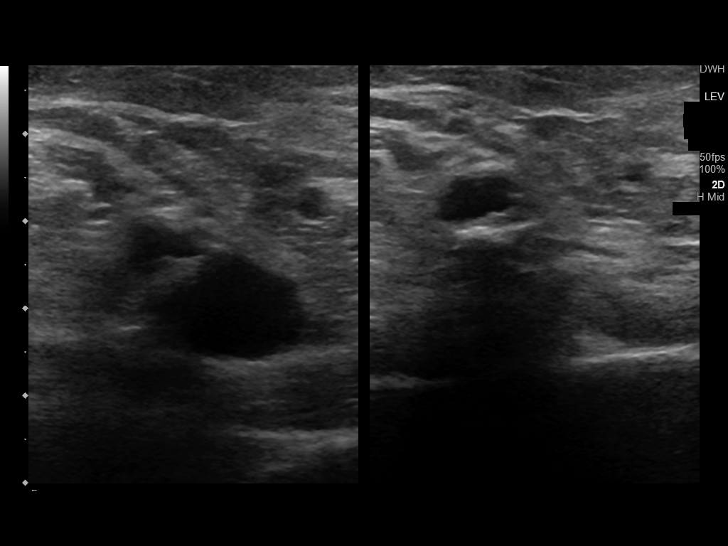
[im 6/59]
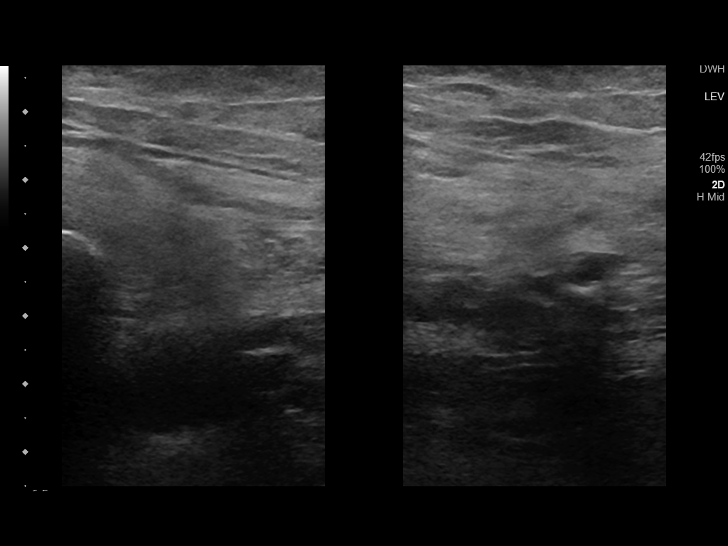
[im 11/59]
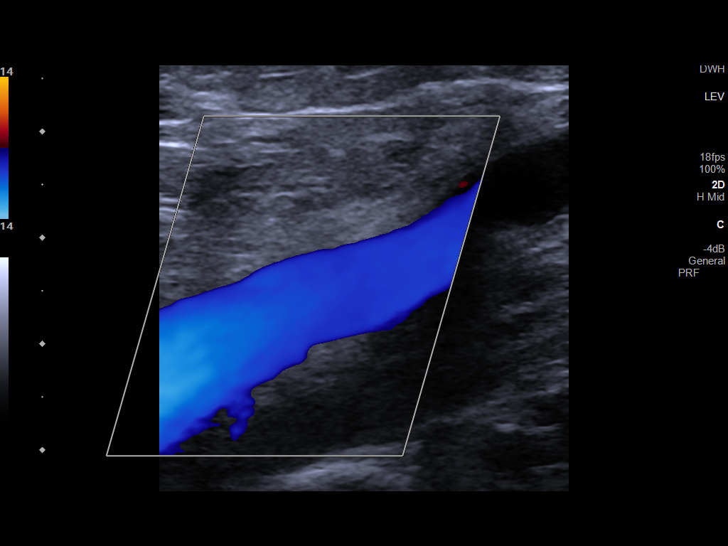
[im 16/59]
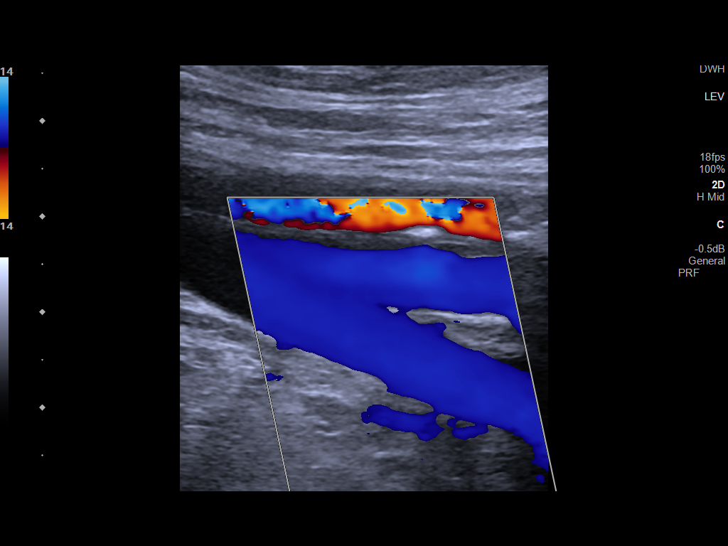
[im 21/59]
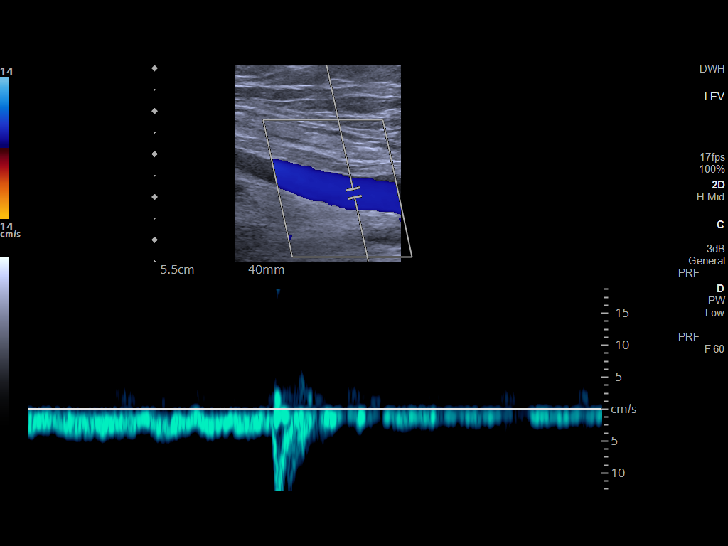
[im 26/59]
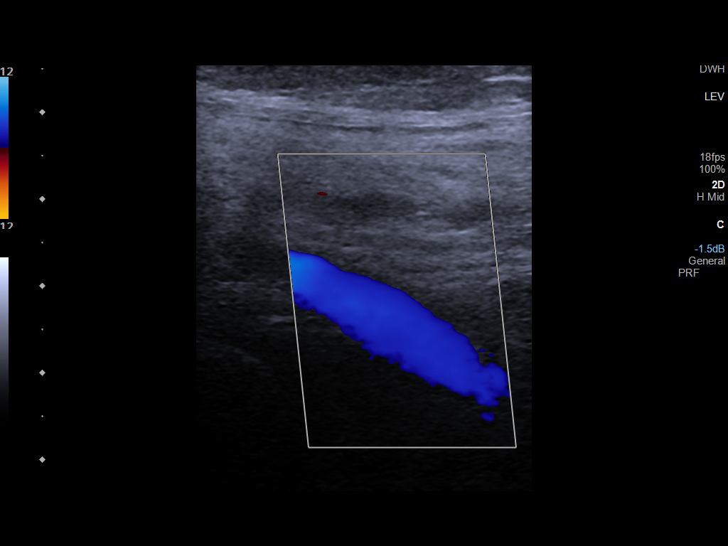
[im 31/59]
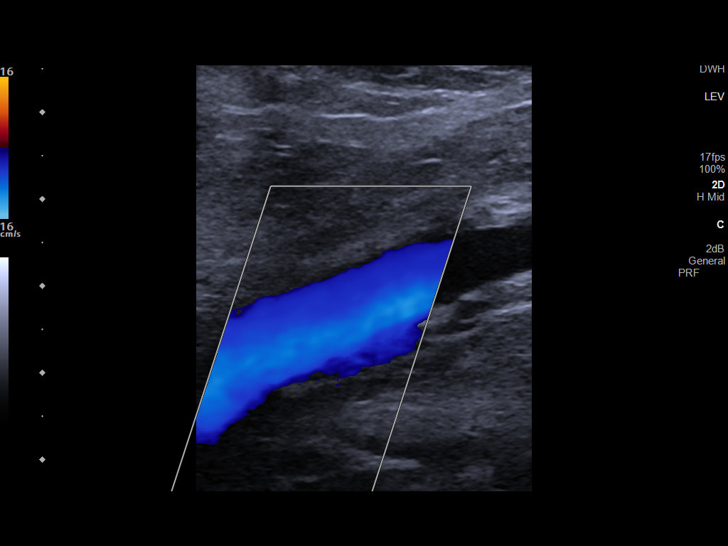
[im 33/59]
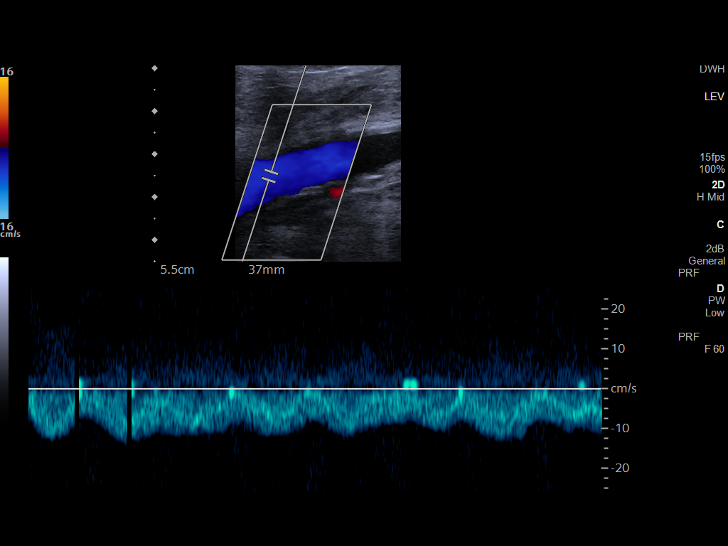
[im 38/59]
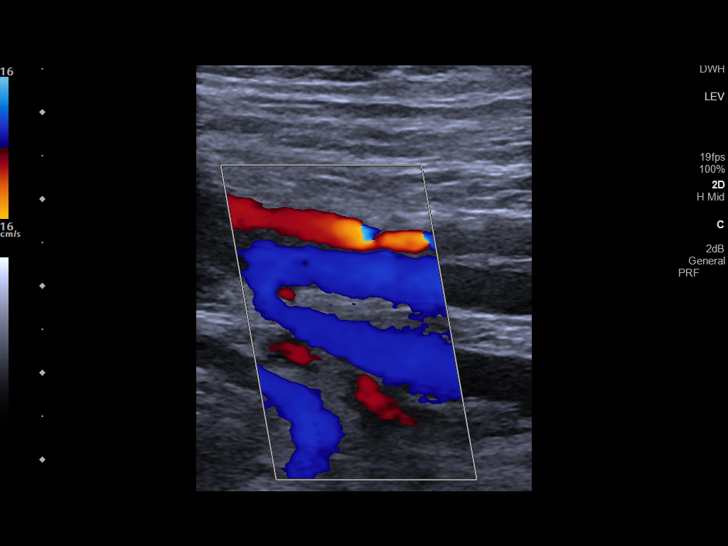
[im 43/59]
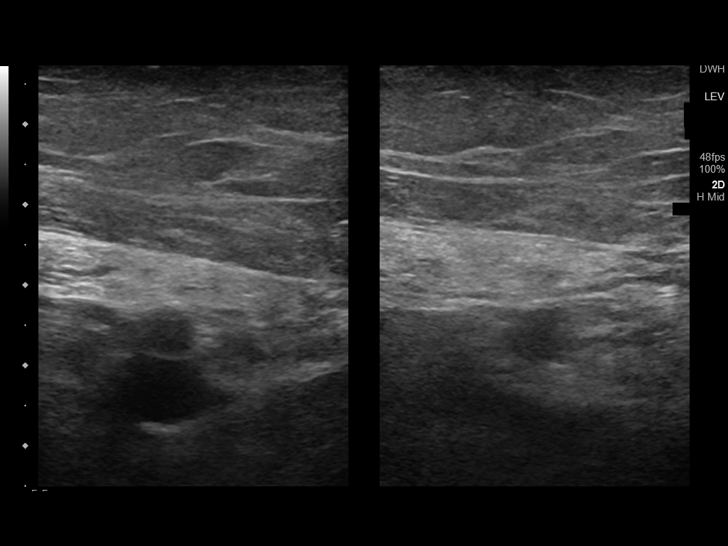
[im 48/59]
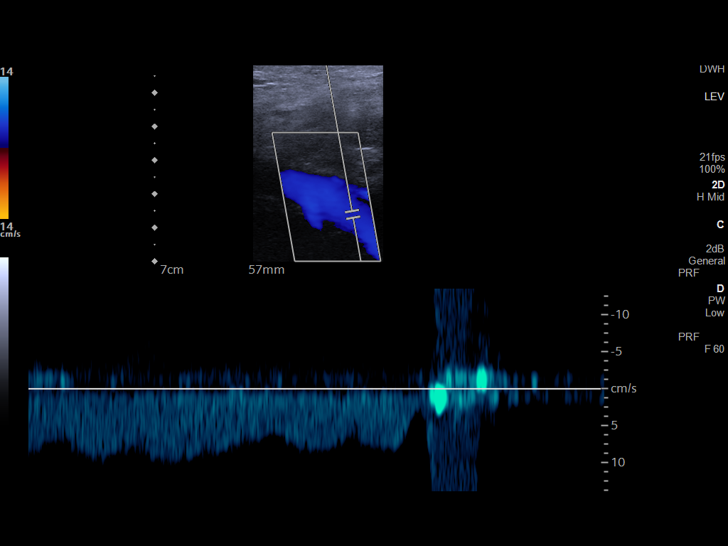
[im 53/59]
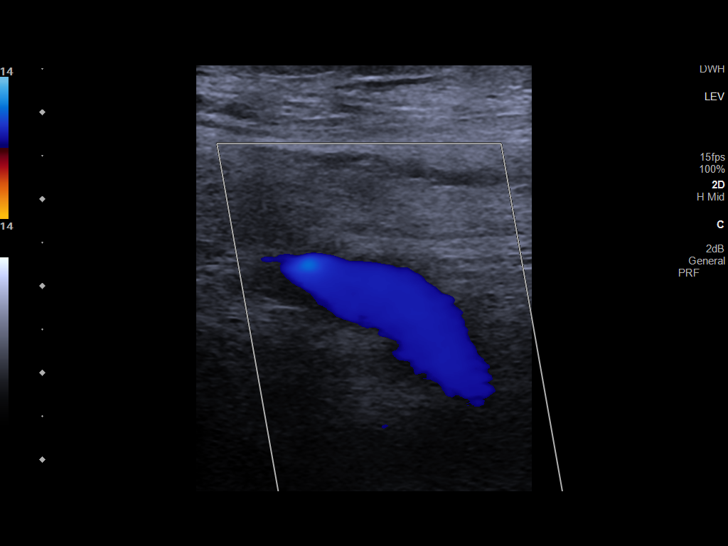
[im 59/59]
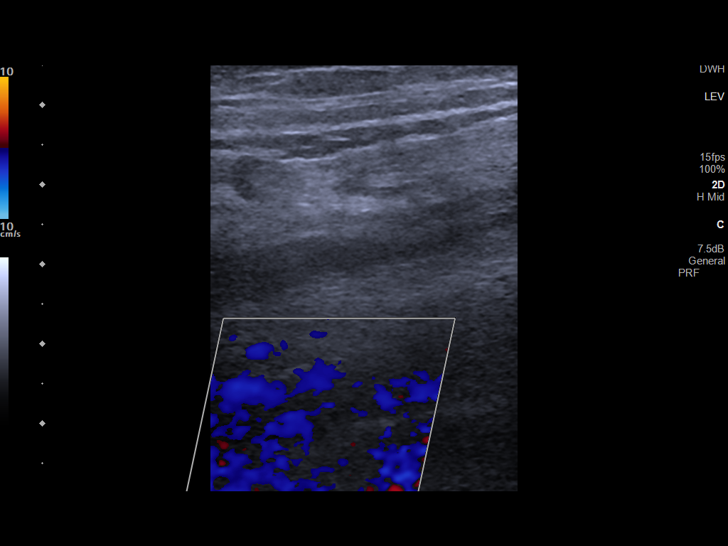

[13 of 24 positions shown; findings below may reference images not displayed]

FINDINGS: RIGHT LOWER EXTREMITY

Common Femoral Vein: No evidence of thrombus. Normal
compressibility, respiratory phasicity and response to augmentation.

Saphenofemoral Junction: No evidence of thrombus. Normal
compressibility and flow on color Doppler imaging.

Profunda Femoral Vein: No evidence of thrombus. Normal
compressibility and flow on color Doppler imaging.

Femoral Vein: No evidence of thrombus. Normal compressibility,
respiratory phasicity and response to augmentation.

Popliteal Vein: No evidence of thrombus. Normal compressibility,
respiratory phasicity and response to augmentation.

Calf Veins: No evidence of thrombus. Normal compressibility and flow
on color Doppler imaging.

Superficial Great Saphenous Vein: No evidence of thrombus. Normal
compressibility.

Venous Reflux:  None.

Other Findings:  None.

LEFT LOWER EXTREMITY

Common Femoral Vein: No evidence of thrombus. Normal
compressibility, respiratory phasicity and response to augmentation.

Saphenofemoral Junction: No evidence of thrombus. Normal
compressibility and flow on color Doppler imaging.

Profunda Femoral Vein: No evidence of thrombus. Normal
compressibility and flow on color Doppler imaging.

Femoral Vein: No evidence of thrombus. Normal compressibility,
respiratory phasicity and response to augmentation.

Popliteal Vein: No evidence of thrombus. Normal compressibility,
respiratory phasicity and response to augmentation.

Calf Veins: No evidence of thrombus. Normal compressibility and flow
on color Doppler imaging.

Superficial Great Saphenous Vein: No evidence of thrombus. Normal
compressibility.

Venous Reflux:  None.

Other Findings:  None.
IMPRESSION: No evidence of deep venous thrombosis in either lower extremity.

## 2022-06-17 IMAGING — CT CT HEAD W/O CM
3 series · 15 of 46 positions shown, 18 images · non-contrast
Comparison: 08/19/2020

CLINICAL DATA: Shaking, history of brain tumor

EXAM:
CT HEAD WITHOUT CONTRAST
TECHNIQUE: Contiguous axial images were obtained from the base of the skull
through the vertex without intravenous contrast.

[Series 3: head wo · axial · 0.45mm/px · z∈[+1009,+1129]mm · 9 of 29 slices shown, 12 images]
[im 3/29  brain]
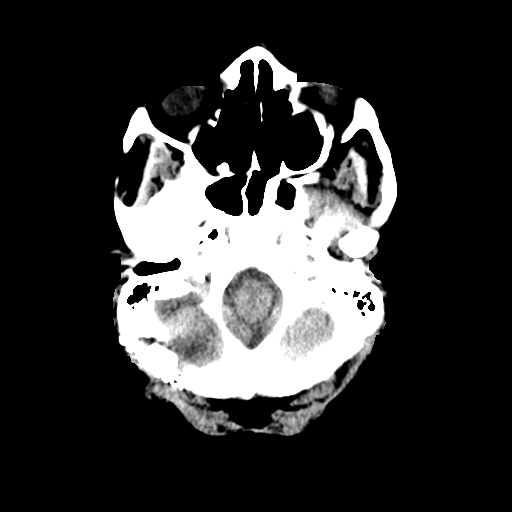
[im 3/29  bone]
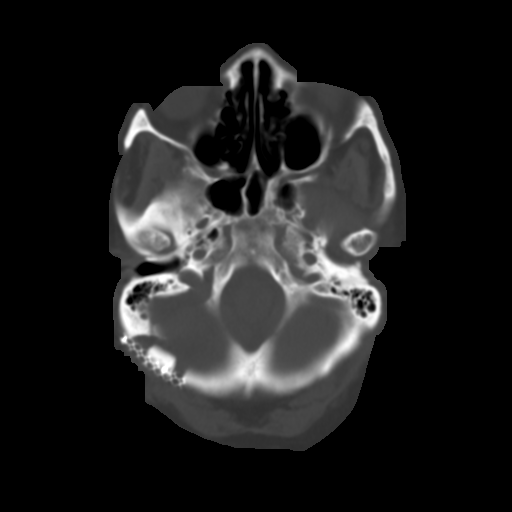
[im 6/29  brain]
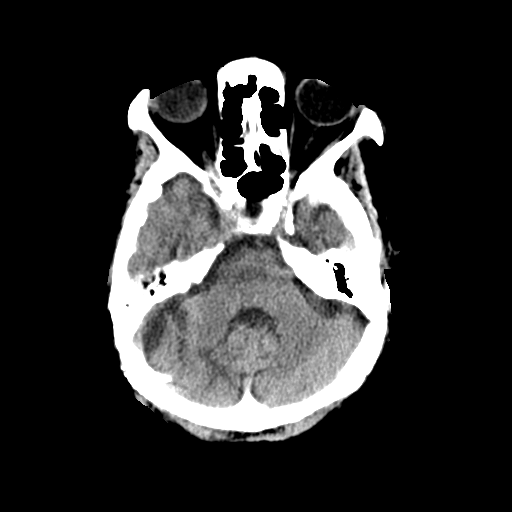
[im 9/29  brain]
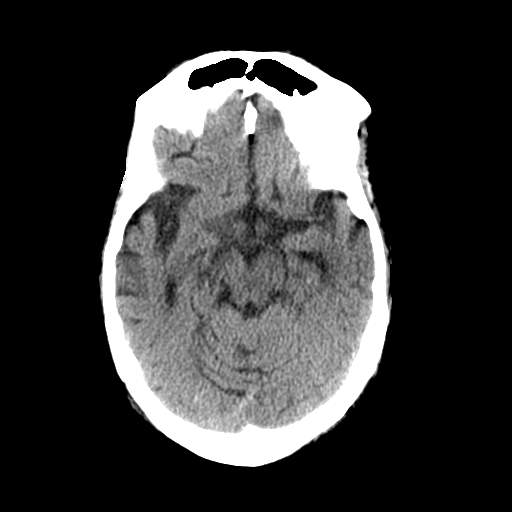
[im 12/29  brain]
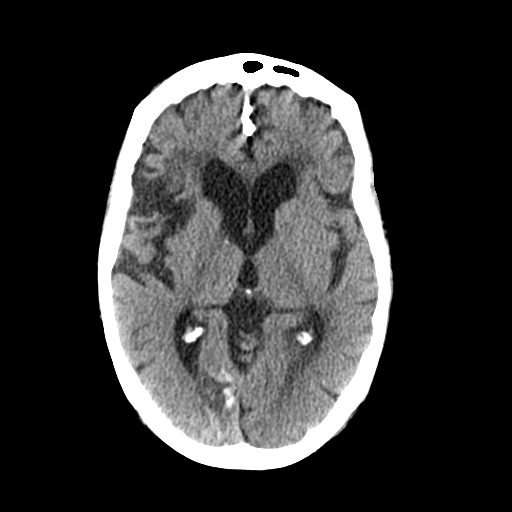
[im 15/29  brain]
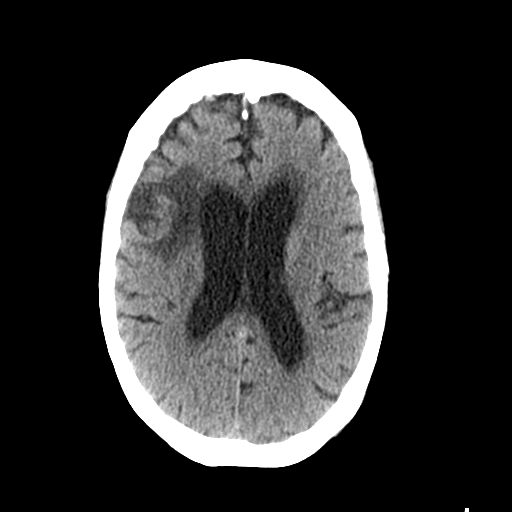
[im 15/29  bone]
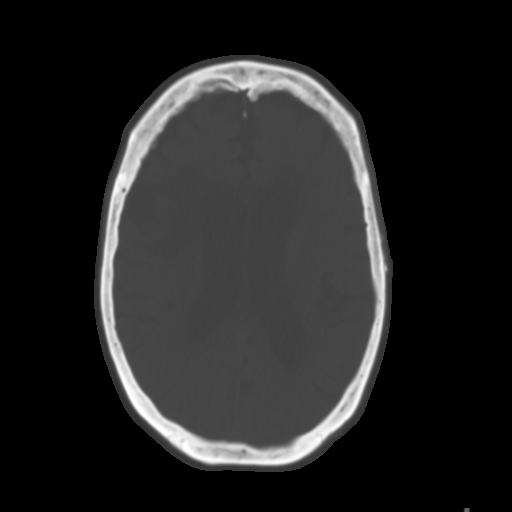
[im 18/29  brain]
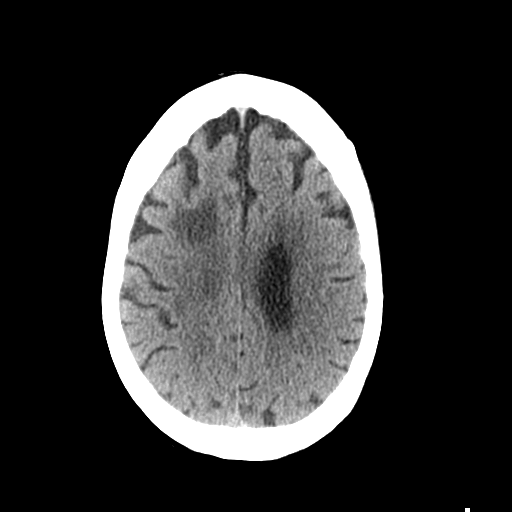
[im 21/29  brain]
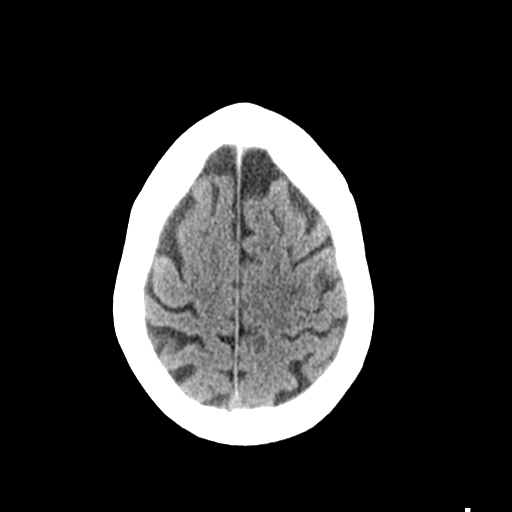
[im 24/29  brain]
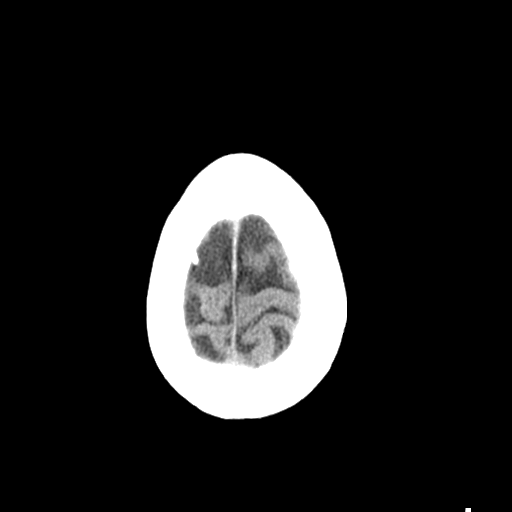
[im 27/29  brain]
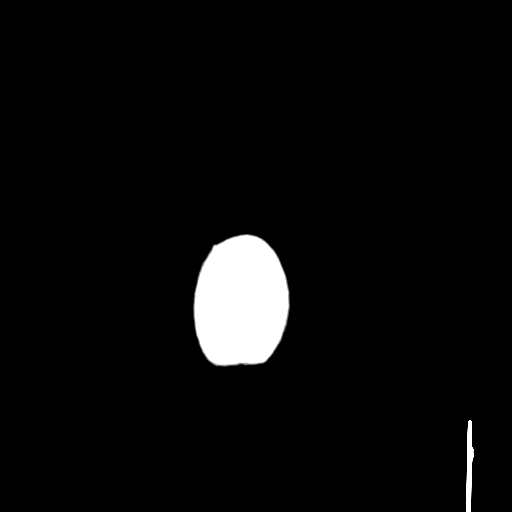
[im 27/29  bone]
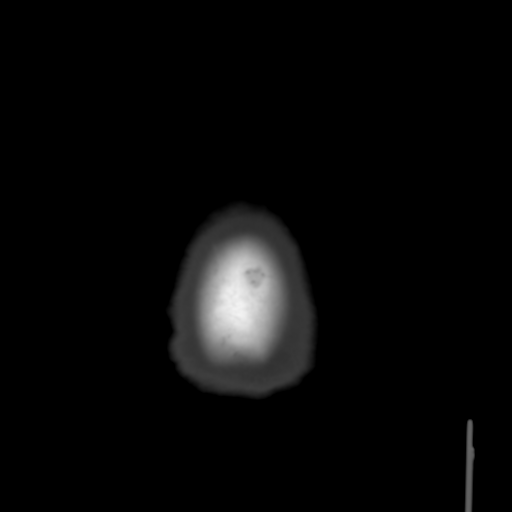

[Series 4: coronal soft tissue · coronal · 0.29mm/px · 3 of 64 slices shown]
[im 22/64  brain]
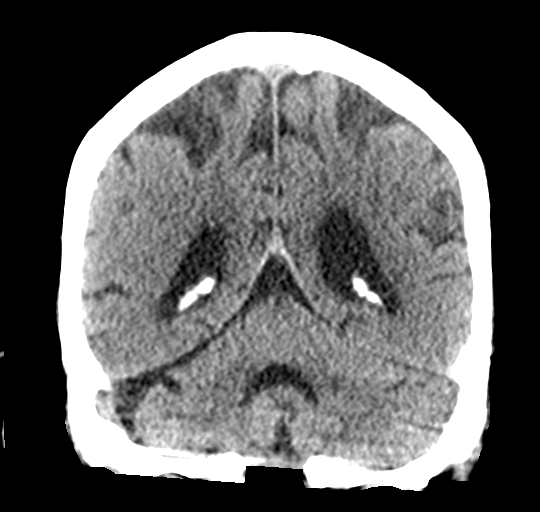
[im 29/64  brain]
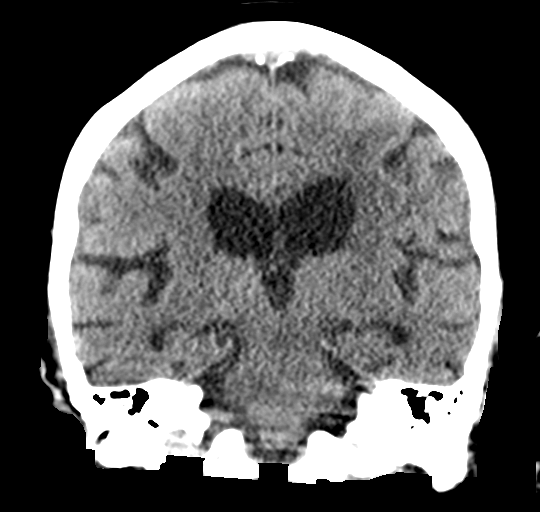
[im 36/64  brain]
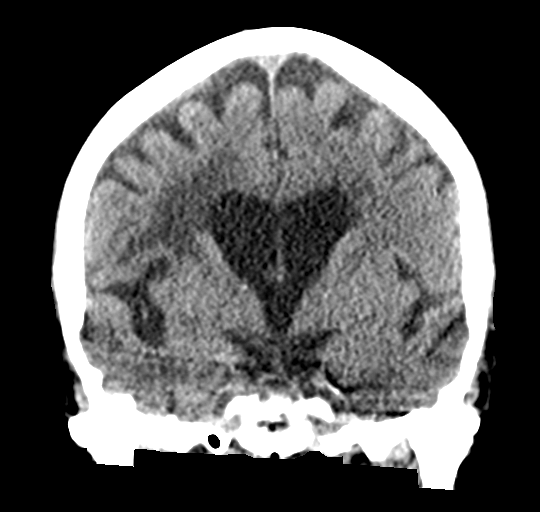

[Series 5: sagittal soft tissue · sagittal · 0.29mm/px · 3 of 46 slices shown]
[im 16/46  brain]
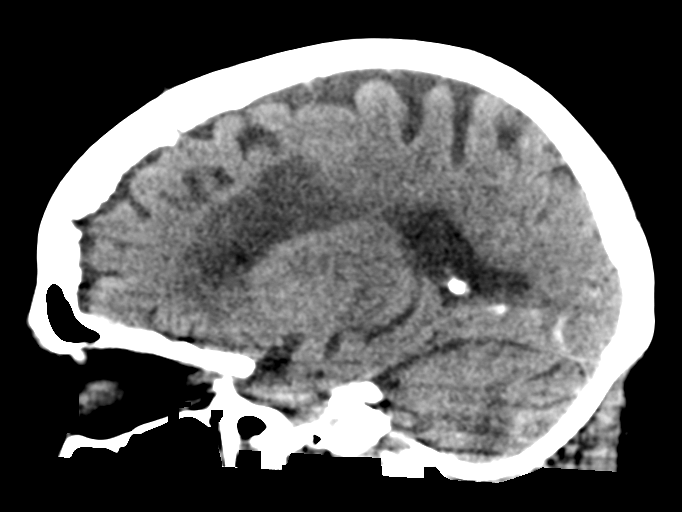
[im 23/46  brain]
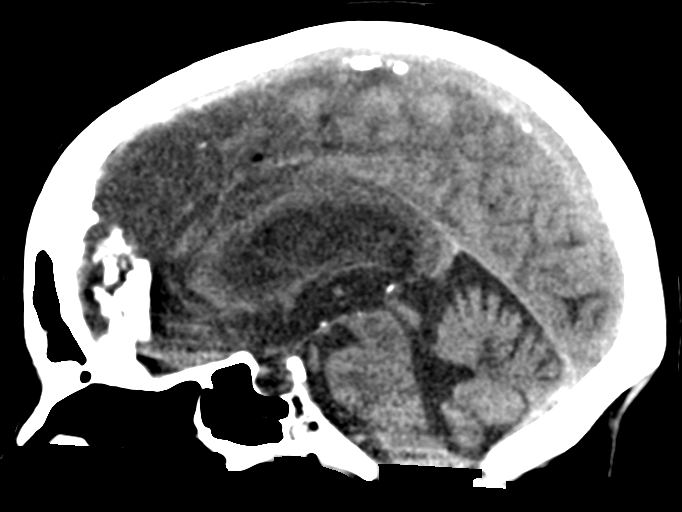
[im 31/46  brain]
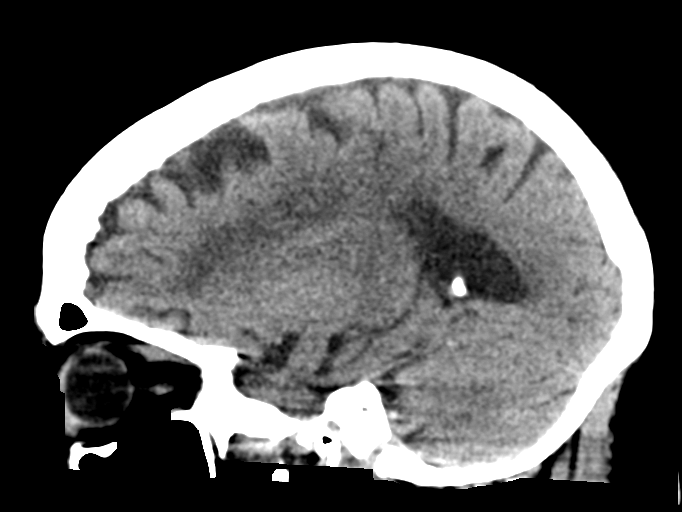

[15 of 46 positions shown; findings below may reference images not displayed]

FINDINGS: Brain: Chronic right MCA territory infarction involving the frontal
lobe and insula. There is gyriform hyperdensity in the right
occipital lobe.

Hypoattenuation likely reflecting surgical cavity is present in the
right cerebellum underlying craniectomy and cranioplasty without
mass effect. Limited evaluation for tumor on this study.

Prominence of the ventricles and sulci reflects generalized
parenchymal volume loss. Additional patchy hypoattenuation in the
supratentorial white matter is nonspecific but probably reflects
stable chronic microvascular ischemic changes.

Vascular: No hyperdense vessel.There is atherosclerotic
calcification at the skull base.

Skull: Right lateral suboccipital craniectomy with cranioplasty.
Small right frontal burr hole.

Sinuses/Orbits: No acute finding.

Other: None.
IMPRESSION: Gyriform hyperdensity in the right occipital lobe likely reflecting
interval, now subacute infarction with laminar necrosis or petechial
hemorrhage.

Postoperative changes in the posterior fossa.

Chronic right MCA territory infarct. Chronic microvascular ischemic
changes.
